# Patient Record
Sex: Male | Born: 1975 | Race: Black or African American | Hispanic: No | Smoking: Former smoker
Health system: Southern US, Community
[De-identification: ages and names within clinical notes are randomized; demographics above are authoritative.]

## PROBLEM LIST (undated history)

## (undated) DIAGNOSIS — I10 Essential (primary) hypertension: Secondary | ICD-10-CM

## (undated) DIAGNOSIS — K219 Gastro-esophageal reflux disease without esophagitis: Secondary | ICD-10-CM

## (undated) DIAGNOSIS — G4733 Obstructive sleep apnea (adult) (pediatric): Secondary | ICD-10-CM

## (undated) DIAGNOSIS — I509 Heart failure, unspecified: Secondary | ICD-10-CM

## (undated) DIAGNOSIS — K429 Umbilical hernia without obstruction or gangrene: Secondary | ICD-10-CM

## (undated) DIAGNOSIS — K5730 Diverticulosis of large intestine without perforation or abscess without bleeding: Secondary | ICD-10-CM

## (undated) DIAGNOSIS — M109 Gout, unspecified: Secondary | ICD-10-CM

## (undated) DIAGNOSIS — I499 Cardiac arrhythmia, unspecified: Secondary | ICD-10-CM

## (undated) DIAGNOSIS — R569 Unspecified convulsions: Secondary | ICD-10-CM

## (undated) HISTORY — DX: Diverticulosis of large intestine without perforation or abscess without bleeding: K57.30

## (undated) HISTORY — DX: Umbilical hernia without obstruction or gangrene: K42.9

## (undated) HISTORY — DX: Obstructive sleep apnea (adult) (pediatric): G47.33

---

## 2008-10-23 ENCOUNTER — Emergency Department: Payer: Self-pay | Admitting: Emergency Medicine

## 2010-01-16 ENCOUNTER — Inpatient Hospital Stay: Payer: Self-pay | Admitting: Internal Medicine

## 2011-01-18 ENCOUNTER — Emergency Department: Payer: Self-pay | Admitting: Unknown Physician Specialty

## 2011-12-22 ENCOUNTER — Ambulatory Visit: Payer: Self-pay | Admitting: Gastroenterology

## 2012-01-06 ENCOUNTER — Ambulatory Visit: Payer: Self-pay | Admitting: Gastroenterology

## 2012-01-07 LAB — PATHOLOGY REPORT

## 2012-10-25 HISTORY — PX: ESOPHAGOGASTRODUODENOSCOPY ENDOSCOPY: SHX5814

## 2015-11-26 ENCOUNTER — Emergency Department
Admission: EM | Admit: 2015-11-26 | Discharge: 2015-11-26 | Disposition: A | Discharge status: Home / Self Care | Payer: Self-pay | Attending: Emergency Medicine | Admitting: Emergency Medicine

## 2015-11-26 ENCOUNTER — Emergency Department: Payer: Self-pay

## 2015-11-26 ENCOUNTER — Encounter: Payer: Self-pay | Admitting: *Deleted

## 2015-11-26 DIAGNOSIS — M10072 Idiopathic gout, left ankle and foot: Secondary | ICD-10-CM | POA: Insufficient documentation

## 2015-11-26 DIAGNOSIS — M109 Gout, unspecified: Secondary | ICD-10-CM

## 2015-11-26 HISTORY — DX: Gout, unspecified: M10.9

## 2015-11-26 LAB — URIC ACID: Uric Acid, Serum: 9.7 mg/dL — ABNORMAL HIGH (ref 4.4–7.6)

## 2015-11-26 MED ORDER — MELOXICAM 15 MG PO TABS: 15.0000 mg | ORAL_TABLET | Freq: Every day | ORAL | Status: DC

## 2015-11-26 NOTE — Discharge Instructions (Signed)

## 2015-11-26 NOTE — ED Provider Notes (Signed)
[facility] Emergency Department Provider Note  ____________________________________________  Time seen: Approximately 6:47 PM  I have reviewed the triage vital signs and the nursing notes.   HISTORY  Chief Complaint Foot Pain    HPI Ronald Pratt is a 40 y.o. male who presents to emergency department complaining of left lateral foot pain. Patient states that he has had symptoms for 2 weeks. He denies any injury precipitating this. He states he does have a history of gout but has not had an issue and 7 years since he had heavy alcohol use. Patient has been a cough free 7 years. He states the area is swollen, painful, worse with weightbearing. The area is sharp in nature. Patient denies any numbness or tingling distally. He denies any other symptoms or complaints at this time.   Past Medical History  Diagnosis Date  . Gout     There are no active problems to display for this patient.   History reviewed. No pertinent past surgical history.  Current Outpatient Rx  Name  Route  Sig  Dispense  Refill  . meloxicam (MOBIC) 15 MG tablet   Oral   Take 1 tablet (15 mg total) by mouth daily.   30 tablet   0     Allergies Review of patient's allergies indicates no known allergies.  No family history on file.  Social History Social History  Substance Use Topics  . Smoking status: Never Smoker   . Smokeless tobacco: None  . Alcohol Use: No     Review of Systems  Constitutional: No fever/chills Cardiovascular: no chest pain. Respiratory: no cough. No SOB. Gastrointestinal: No abdominal pain.  No nausea, no vomiting.   Genitourinary: Negative for dysuria. No hematuria Musculoskeletal: Negative for back pain. Positive for left foot pain. Skin: Negative for rash. Neurological: Negative for headaches, focal weakness or numbness. 10-point ROS otherwise negative.  ____________________________________________   PHYSICAL EXAM:  VITAL SIGNS: ED  Triage Vitals  Enc Vitals Group     BP 11/26/15 1815 174/100 mmHg     Pulse Rate 11/26/15 1815 103     Resp 11/26/15 1815 20     Temp 11/26/15 1815 97.8 F (36.6 C)     Temp Source 11/26/15 1815 Oral     SpO2 11/26/15 1815 99 %     Weight 11/26/15 1815 278 lb (126.1 kg)     Height 11/26/15 1815  (1.854 m)     Head Cir --      Peak Flow --      Pain Score 11/26/15 1816 7     Pain Loc --      Pain Edu? --      Excl. in GC? --      Constitutional: Alert and oriented. Well appearing and in no acute distress. Neck: No stridor.   Hematological/Lymphatic/Immunilogical: No cervical lymphadenopathy. Cardiovascular: Normal rate, regular rhythm. Normal S1 and S2.  Good peripheral circulation. Respiratory: Normal respiratory effort without tachypnea or retractions. Lungs CTAB. Gastrointestinal: Soft and nontender. No distention. No CVA tenderness. Musculoskeletal: No lower extremity tenderness nor edema.  No joint effusions. Minor edema noted to the left lateral foot over the base of fifth metatarsal and compared with right. Area is warm to palpation. Area is very tender to palpation. No palpable abnormality. Full range of motion of ankle and all digits. Sensation intact 5 digits and equal to unaffected extremity. Dorsalis pedis pulses appreciated bilaterally. Neurologic:  Normal speech and language. No gross focal neurologic  deficits are appreciated.  Skin:  Skin is warm, dry and intact. No rash noted. Psychiatric: Mood and affect are normal. Speech and behavior are normal. Patient exhibits appropriate insight and judgement.   ____________________________________________   LABS (all labs ordered are listed, but only abnormal results are displayed)  Labs Reviewed  URIC ACID - Abnormal; Notable for the following:    Uric Acid, Serum 9.7 (*)    All other components within normal limits    ____________________________________________  EKG   ____________________________________________  RADIOLOGY Festus Barren Cuthriell, personally viewed and evaluated these images (plain radiographs) as part of my medical decision making, as well as reviewing the written report by the radiologist.  Dg Foot Complete Left  11/26/2015  CLINICAL DATA:  Left foot pain and swelling without a history of injury. Redness at the base of the fifth toe. EXAM: LEFT FOOT - COMPLETE 3+ VIEW COMPARISON:  None. FINDINGS: No evidence of fracture. No subluxation or dislocation. Degenerative changes are seen at the MTP joint of the great toe. IMPRESSION: No acute bony findings. Specifically, no evidence for bony erosion or destruction in the little toe. Electronically Signed   By: Kennith Center M.D.   On: 11/26/2015 19:16    ____________________________________________    PROCEDURES  Procedure(s) performed:       Medications - No data to display   ____________________________________________   INITIAL IMPRESSION / ASSESSMENT AND PLAN / ED COURSE  Pertinent labs & imaging results that were available during my care of the patient were reviewed by me and considered in my medical decision making (see chart for details).  Patient's diagnosis is consistent with gout. Patient will be discharged home with prescriptions for anti-inflammatories. Patient is to follow up with her care provider if symptoms persist past this treatment course. Patient is given ED precautions to return to the ED for any worsening or new symptoms.     ____________________________________________  FINAL CLINICAL IMPRESSION(S) / ED DIAGNOSES  Final diagnoses:  Acute gout of left foot, unspecified cause      NEW MEDICATIONS STARTED DURING THIS VISIT:  New Prescriptions   MELOXICAM (MOBIC) 15 MG TABLET    Take 1 tablet (15 mg total) by mouth daily.        Delorise Royals Cuthriell, PA-C 11/26/15 2007  Phineas Semen, MD 11/26/15 2153

## 2015-11-26 NOTE — ED Notes (Signed)
Patient transported to X-ray 

## 2015-11-26 NOTE — ED Notes (Signed)
Pt complains of left foot pain with swelling with no injury

## 2015-11-27 ENCOUNTER — Ambulatory Visit: Payer: Self-pay

## 2015-11-27 ENCOUNTER — Encounter: Payer: Self-pay | Admitting: Podiatry

## 2015-11-28 NOTE — Progress Notes (Signed)
No show

## 2016-12-11 ENCOUNTER — Encounter: Payer: Self-pay | Admitting: Emergency Medicine

## 2016-12-11 ENCOUNTER — Emergency Department
Admission: EM | Admit: 2016-12-11 | Discharge: 2016-12-11 | Disposition: A | Discharge status: Home / Self Care | Payer: 59 | Attending: Emergency Medicine | Admitting: Emergency Medicine

## 2016-12-11 ENCOUNTER — Emergency Department: Payer: 59

## 2016-12-11 DIAGNOSIS — R51 Headache: Secondary | ICD-10-CM | POA: Diagnosis not present

## 2016-12-11 DIAGNOSIS — I10 Essential (primary) hypertension: Secondary | ICD-10-CM | POA: Insufficient documentation

## 2016-12-11 DIAGNOSIS — R1033 Periumbilical pain: Secondary | ICD-10-CM

## 2016-12-11 DIAGNOSIS — K5730 Diverticulosis of large intestine without perforation or abscess without bleeding: Secondary | ICD-10-CM | POA: Insufficient documentation

## 2016-12-11 DIAGNOSIS — Z79899 Other long term (current) drug therapy: Secondary | ICD-10-CM | POA: Diagnosis not present

## 2016-12-11 DIAGNOSIS — K429 Umbilical hernia without obstruction or gangrene: Secondary | ICD-10-CM | POA: Diagnosis not present

## 2016-12-11 DIAGNOSIS — E278 Other specified disorders of adrenal gland: Secondary | ICD-10-CM

## 2016-12-11 HISTORY — DX: Diverticulosis of large intestine without perforation or abscess without bleeding: K57.30

## 2016-12-11 HISTORY — DX: Essential (primary) hypertension: I10

## 2016-12-11 LAB — URINALYSIS, COMPLETE (UACMP) WITH MICROSCOPIC
BACTERIA UA: NONE SEEN
BILIRUBIN URINE: NEGATIVE
Glucose, UA: NEGATIVE mg/dL
HGB URINE DIPSTICK: NEGATIVE
KETONES UR: NEGATIVE mg/dL
LEUKOCYTES UA: NEGATIVE
NITRITE: NEGATIVE
Protein, ur: NEGATIVE mg/dL
SPECIFIC GRAVITY, URINE: 1.015 (ref 1.005–1.030)
pH: 6 (ref 5.0–8.0)

## 2016-12-11 LAB — LIPASE, BLOOD: LIPASE: 31 U/L (ref 11–51)

## 2016-12-11 LAB — COMPREHENSIVE METABOLIC PANEL
ALT: 21 U/L (ref 17–63)
ANION GAP: 7 (ref 5–15)
AST: 21 U/L (ref 15–41)
Albumin: 4 g/dL (ref 3.5–5.0)
Alkaline Phosphatase: 54 U/L (ref 38–126)
BILIRUBIN TOTAL: 1.2 mg/dL (ref 0.3–1.2)
BUN: 9 mg/dL (ref 6–20)
CHLORIDE: 107 mmol/L (ref 101–111)
CO2: 25 mmol/L (ref 22–32)
Calcium: 9.2 mg/dL (ref 8.9–10.3)
Creatinine, Ser: 1.18 mg/dL (ref 0.61–1.24)
Glucose, Bld: 104 mg/dL — ABNORMAL HIGH (ref 65–99)
POTASSIUM: 3.6 mmol/L (ref 3.5–5.1)
Sodium: 139 mmol/L (ref 135–145)
TOTAL PROTEIN: 6.8 g/dL (ref 6.5–8.1)

## 2016-12-11 LAB — CBC
HEMATOCRIT: 42.3 % (ref 40.0–52.0)
HEMOGLOBIN: 14.8 g/dL (ref 13.0–18.0)
MCH: 31.5 pg (ref 26.0–34.0)
MCHC: 35.1 g/dL (ref 32.0–36.0)
MCV: 89.8 fL (ref 80.0–100.0)
Platelets: 200 10*3/uL (ref 150–440)
RBC: 4.71 MIL/uL (ref 4.40–5.90)
RDW: 13.2 % (ref 11.5–14.5)
WBC: 7.1 10*3/uL (ref 3.8–10.6)

## 2016-12-11 MED ORDER — NAPROXEN 500 MG PO TABS: 500.0000 mg | ORAL_TABLET | Freq: Two times a day (BID) | ORAL | 0 refills | Status: DC

## 2016-12-11 MED ORDER — IOPAMIDOL (ISOVUE-300) INJECTION 61%
100.0000 mL | Freq: Once | INTRAVENOUS | Status: AC | PRN
  Administered 2016-12-11: 100 mL via INTRAVENOUS

## 2016-12-11 MED ORDER — IOPAMIDOL (ISOVUE-300) INJECTION 61%
30.0000 mL | Freq: Once | INTRAVENOUS | Status: AC | PRN
  Administered 2016-12-11: 30 mL via ORAL

## 2016-12-11 MED ORDER — DOCUSATE SODIUM 100 MG PO CAPS: 200.0000 mg | ORAL_CAPSULE | Freq: Two times a day (BID) | ORAL | 0 refills | Status: DC

## 2016-12-11 NOTE — ED Triage Notes (Signed)
Pt ambulatory to triage in NAD, reports umbilical hernia giving him pain over past couple weeks, reports some vomiting.

## 2016-12-11 NOTE — ED Provider Notes (Signed)
[facility] Emergency Department Provider Note  ____________________________________________  Time seen: Approximately 8:05 AM  I have reviewed the triage vital signs and the nursing notes.   HISTORY  Chief Complaint Abdominal Pain and Hernia    HPI Audrie GallusJohn E Barillas is a 41 y.o. male who reports multiple chronic complaints. First, he complains of suprapubic and periumbilical pain for the last couple of weeks. It's worse when he is doing heavy lifting. He does a lot of exertional activity and heavy lifting at work. He has a small bulge at the belly button. This has not been previously evaluated. He is having some decreased stool caliber and intermittent vomiting. Pain is nonradiating, colicky, moderate intensity at its worst. No fever or chills.  Patient also reports intermittent headaches with right sided paresthesia which is been going on for last 2-3 months. Comes and goes at random. No aggravating or alleviating factors. No weakness. No vision changes. No syncope.  The patient has scheduled a visit with primary care which is on 12/24/2016.   Past Medical History:  Diagnosis Date  . Gout   . Hypertension      There are no active problems to display for this patient.    History reviewed. No pertinent surgical history.   Prior to Admission medications   Medication Sig Start Date End Date Taking? Authorizing Provider  Chilton SiGreen Tea, Camillia sinensis, ([employer] SUPER GREEN TEA EXTRACT) 250 MG CAPS Take 2 capsules by mouth 2 (two) times daily.   Yes Historical Provider, MD  meloxicam (MOBIC) 15 MG tablet Take 1 tablet (15 mg total) by mouth daily. 11/26/15  Yes Christiane HaJonathan D Cuthriell, PA-C  docusate sodium (COLACE) 100 MG capsule Take 2 capsules (200 mg total) by mouth 2 (two) times daily. 12/11/16   Sharman CheekPhillip Sharlee Rufino, MD  naproxen (NAPROSYN) 500 MG tablet Take 1 tablet (500 mg total) by mouth 2 (two) times daily with a meal. 12/11/16   Sharman CheekPhillip Ceaira Ernster, MD      Allergies Patient has no known allergies.   History reviewed. No pertinent family history.  Social History Social History  Substance Use Topics  . Smoking status: Never Smoker  . Smokeless tobacco: Never Used  . Alcohol use No    Review of Systems  Constitutional:   No fever or chills.  ENT:   No sore throat. No rhinorrhea. Cardiovascular:   No chest pain. Respiratory:   No dyspnea or cough. Gastrointestinal:   Abdominal pain as above with occasional vomiting. Denies constipation.  Genitourinary:   Negative for dysuria or difficulty urinating. Musculoskeletal:   Negative for focal pain or swelling Neurological:   Positive as above for intermittent generalized headaches 10-point ROS otherwise negative.  ____________________________________________   PHYSICAL EXAM:  VITAL SIGNS: ED Triage Vitals  Enc Vitals Group     BP 12/11/16 0630 (!) 163/100     Pulse Rate 12/11/16 0630 82     Resp 12/11/16 0630 16     Temp 12/11/16 0630 97.9 F (36.6 C)     Temp Source 12/11/16 0630 Oral     SpO2 12/11/16 0630 97 %     Weight 12/11/16 0630 244 lb (110.7 kg)     Height 12/11/16 0630 6\' 1"  (1.854 m)     Head Circumference --      Peak Flow --      Pain Score 12/11/16 0631 7     Pain Loc --      Pain Edu? --      Excl.  in GC? --     Vital signs reviewed, nursing assessments reviewed.   Constitutional:   Alert and oriented. Well appearing and in no distress. Eyes:   No scleral icterus. No conjunctival pallor. PERRL. EOMI.  No nystagmus. ENT   Head:   Normocephalic and atraumatic.   Nose:   No congestion/rhinnorhea. No septal hematoma   Mouth/Throat:   MMM, no pharyngeal erythema. No peritonsillar mass.    Neck:   No stridor. No SubQ emphysema. No meningismus. Hematological/Lymphatic/Immunilogical:   No cervical lymphadenopathy. Cardiovascular:   RRR. Symmetric bilateral radial and DP pulses.  No murmurs.  Respiratory:   Normal respiratory effort  without tachypnea nor retractions. Breath sounds are clear and equal bilaterally. No wheezes/rales/rhonchi. Gastrointestinal:   Soft with 2-3 cm bulge at the umbilicus which is soft but non-reducible. Suprapubic and umbilical tenderness.. Non distended. There is no CVA tenderness.  No rebound, rigidity, or guarding. Genitourinary:   deferred Musculoskeletal:   Nontender with normal range of motion in all extremities. No joint effusions.  No lower extremity tenderness.  No edema. Neurologic:   Normal speech and language.  CN 2-10 normal. Motor grossly intact. NIH stroke scale 0 No gross focal neurologic deficits are appreciated.  Skin:    Skin is warm, dry and intact. No rash noted.  No petechiae, purpura, or bullae.  ____________________________________________    LABS (pertinent positives/negatives) (all labs ordered are listed, but only abnormal results are displayed) Labs Reviewed  COMPREHENSIVE METABOLIC PANEL - Abnormal; Notable for the following:       Result Value   Glucose, Bld 104 (*)    All other components within normal limits  URINALYSIS, COMPLETE (UACMP) WITH MICROSCOPIC - Abnormal; Notable for the following:    Color, Urine YELLOW (*)    APPearance CLEAR (*)    Squamous Epithelial / LPF 0-5 (*)    All other components within normal limits  LIPASE, BLOOD  CBC   ____________________________________________   EKG    ____________________________________________    RADIOLOGY  CT head unremarkable CT abdomen and pelvis reveals a small umbilical hernia containing only fat. Diverticulosis. Hepatic steatosis. Left-sided adrenal mass requiring non-emergent MRI follow-up.  ____________________________________________   PROCEDURES Procedures  ____________________________________________   INITIAL IMPRESSION / ASSESSMENT AND PLAN / ED COURSE  Pertinent labs & imaging results that were available during my care of the patient were reviewed by me and considered  in my medical decision making (see chart for details).  Patient presents with multiple complaints. Most concerning of which is the possible incarcerated umbilical hernia. We'll get a CT abdomen and pelvis to evaluate. We do CT head to his chronic complaints of paresthesia and headache although I have low suspicion for acute stroke. We'll check labs and continue to monitor.   ----------------------------------------- 12:05 PM on 12/11/2016 -----------------------------------------  Patient calm and comfortable, vital signs unremarkable and stable. Not requiring any medications in the ED. Workup is essentially unremarkable. Patient informed of adrenal mass which requires follow-up. Has an appointment with primary care March 2. Patient provided with CT results to pass along. Follow-up with surgery regarding symptomatic hernia.      ____________________________________________   FINAL CLINICAL IMPRESSION(S) / ED DIAGNOSES  Final diagnoses:  Periumbilical abdominal pain  Umbilical hernia without obstruction and without gangrene  Adrenal mass, left (HCC)  Diverticulosis of large intestine without hemorrhage      New Prescriptions   DOCUSATE SODIUM (COLACE) 100 MG CAPSULE    Take 2 capsules (200 mg total) by mouth  2 (two) times daily.   NAPROXEN (NAPROSYN) 500 MG TABLET    Take 1 tablet (500 mg total) by mouth 2 (two) times daily with a meal.     Portions of this note were generated with dragon dictation software. Dictation errors may occur despite best attempts at proofreading.    Sharman Cheek, MD 12/11/16 [phone]

## 2016-12-11 NOTE — Discharge Instructions (Signed)
Your labs are unremarkable. Your CT shows a small umbilical hernia which contains only fat. Follow-up with surgery for further evaluation of this. The CT also finds a mass on your left adrenal gland. Follow-up with primary care for further evaluation.   Umbilical hernia containing only fat. No panniculitis seen in the  area of this hernia.     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  Further evaluation with nonemergent pre and post contrast MRI should  be considered. Pre and post contrast CT could alternatively be  performed, but would likely be of decreased accuracy given lesion  size.     Mild hepatic steatosis.     Scattered sigmoid diverticula. No bowel obstruction. No abscess.  Appendix appears normal.

## 2016-12-11 NOTE — ED Notes (Signed)
Waiting on disposition. NAD. No needs.

## 2016-12-11 NOTE — ED Notes (Signed)
Waiting on CT results. No needs at this time. NAD

## 2016-12-20 DIAGNOSIS — K429 Umbilical hernia without obstruction or gangrene: Secondary | ICD-10-CM | POA: Insufficient documentation

## 2016-12-20 DIAGNOSIS — K5730 Diverticulosis of large intestine without perforation or abscess without bleeding: Secondary | ICD-10-CM | POA: Insufficient documentation

## 2016-12-23 ENCOUNTER — Encounter: Payer: Self-pay | Admitting: Surgery

## 2016-12-23 ENCOUNTER — Telehealth: Payer: Self-pay

## 2016-12-23 ENCOUNTER — Ambulatory Visit (INDEPENDENT_AMBULATORY_CARE_PROVIDER_SITE_OTHER): Payer: Self-pay | Admitting: Surgery

## 2016-12-23 VITALS — BP 158/101 | HR 83 | Temp 98.0°F | Ht 73.0 in | Wt 244.2 lb

## 2016-12-23 DIAGNOSIS — K429 Umbilical hernia without obstruction or gangrene: Secondary | ICD-10-CM

## 2016-12-23 NOTE — Telephone Encounter (Signed)
Morrie SheldonAshley from Dr.Hande office called and stated she moved patients appointment from 01/03/17 to 12/29/16 and that patient had been notified of change. Also let her know that I had faxed over Medical Clearance form to be completed after office visit.   I called patient to be sure he knew of appointment change. Patient verbalized he was aware.

## 2016-12-23 NOTE — Progress Notes (Addendum)
01/08/2017  Reason for Visit:  Umbilical hernia  History of Present Illness: Ronald Pratt is a 41 y.o. male who was seen in the emergency room on 12/11/16 with abdominal pain in the periumbilical area.  He had a CAT scan of the abdomen and pelvis which showed an umbilical hernia containing fat only. His pain improved while in the emergency room and was discharged to home. Of note he was also noted to have a left renal lesion or mass and radiology recommended follow-up with an MRI.  The patient reports that he's had this umbilical hernia for about 2 years, and has had terminated episodes of pain in the periumbilical area particularly as the hernia bulges out more. He is reported some episodes of nausea in the past but no episodes of emesis. He denies having any fevers or chills. Denies having any other areas of abdominal pain. Denies having any constipation. He reports that he's been on a diet with his girlfriend and has been losing a significant amount of weight about 30 pounds over the last few months and that has improved with pain symptoms from his hernia as there is less pressure on it. He does heavy lifting for work and does have more discomfort at the end of a long day.  Past Medical History: Past Medical History:  Diagnosis Date  . Diverticulosis large intestine w/o perforation or abscess w/o bleeding 12/11/2016  . Dysrhythmia    tacchycardia new..  put on metoprolol by dr. Juliann Parescallwood for surgery  . GERD (gastroesophageal reflux disease)   . Gout   . Hypertension   . 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  [name]. Umbilical hernia      Past Surgical History: --None  Home Medications: Prior to Admission medications   Medication Sig Start Date End Date Taking? Authorizing Provider  docusate sodium (COLACE) 100 MG capsule Take 2 capsules (200 mg total) by mouth 2 (two) times daily. 12/11/16   Sharman CheekPhillip Stafford, MD  Chilton SiGreen Tea, Camillia sinensis, ([employer]  SUPER GREEN TEA EXTRACT) 250 MG CAPS Take 2 capsules by mouth 2 (two) times daily.    Historical Provider, MD  meloxicam (MOBIC) 15 MG tablet Take 1 tablet (15 mg total) by mouth daily. 11/26/15   Delorise RoyalsJonathan D Cuthriell, PA-C  naproxen (NAPROSYN) 500 MG tablet Take 1 tablet (500 mg total) by mouth 2 (two) times daily with a meal. 12/11/16   Sharman CheekPhillip Stafford, MD    Allergies: No Known Allergies  Social History: Patient smokes 1 ppd and trying to cut down. He has never used smokeless tobacco. He reports that he does not drink alcohol and quit 8 years ago. His drug history is not on file.   Family History: Family History  Problem Relation Age of Onset  . Breast cancer Mother   . Heart disease Mother   . Prostate cancer Father   . Colon cancer Father   . Diabetes Sister   . Heart disease Sister     Review of Systems: Review of Systems  Constitutional: Negative for chills and fever.  HENT: Negative for hearing loss.   Eyes: Negative for blurred vision.  Respiratory: Negative for cough.   Cardiovascular: Negative for chest pain and leg swelling.  Gastrointestinal: Positive for abdominal pain. Negative for constipation, diarrhea, nausea and vomiting.  Genitourinary: Negative for dysuria and hematuria.  Musculoskeletal: Negative for myalgias.  Skin: Negative for rash.  Neurological: Negative for dizziness.  Psychiatric/Behavioral: Negative for depression.  All other systems reviewed and are negative.   Physical Exam BP (!) 158/101   Pulse 83   Temp 98 F (36.7 C) (Oral)   Ht 6\' 1"  (1.854 m)   Wt 110.8 kg (244 lb 3.2 oz)   BMI 32.22 kg/m  CONSTITUTIONAL: No acute distress HEENT:  Normocephalic, atraumatic, extraocular motion intact. NECK: Trachea is midline, and there is no jugular venous distension.  RESPIRATORY:  Lungs are clear, and breath sounds are equal bilaterally. Normal respiratory effort without pathologic use of accessory muscles. CARDIOVASCULAR: Heart is regular  without murmurs, gallops, or rubs. GI: The abdomen is soft, obese, nondistended, with mild tenderness to palpation over the umbilical region.  The patient has a fat containing umbilical hernia which is partially reducible. There were no palpable masses.  MUSCULOSKELETAL:  Normal muscle strength and tone in all four extremities.  No peripheral edema or cyanosis. SKIN: Skin turgor is normal. There are no pathologic skin lesions.  NEUROLOGIC:  Motor and sensation is grossly normal.  Cranial nerves are grossly intact. PSYCH:  Alert and oriented to person, place and time. Affect is normal.  Laboratory Analysis: In the emergency room on 2/17, the patient had a WBC of 7.1, HCT 42.3, sodium 139, potassium 3.6, chloride 107, CO2 25, BUN 9, creatinine 1.18.  Imaging: CT scan on 2/17 shows an umbilical hernia containing only fat measuring about 2.2 cm in diameter. Patient also was found to have a 1.3 x 1.2 cm left renal mass which could not be classified as a cyst and MRI was recommended for follow-up.  Assessment and Plan: This is a 41 y.o. male who presents with a two-year history of an umbilical hernia with a recent visit to the emergency room for periumbilical pain.  -Discussed with the patient that we could potentially do conservative management with watchful waiting with his umbilical hernia although given that he's had intermittent episodes in the recent one that brought him to the emergency room, it would also be reasonable to go ahead and repair this hernia. The patient is opting for surgical management. Discussed with the patient and umbilical hernia repair with mesh and that we would use mesh given that he does heavy lifting for work to help reinforce the repair. Discussed with the patient the risks and benefits of the procedure including risk of bleeding, infection, injury to surrounding structures, as well as postop course and expectations as well as lifting restrictions afterwards. The patient is  willing to proceed and has given informed consent. -Patient has a follow point with his PCP early in March during which she will discuss the findings on his CT scan as well as follow-up on his blood pressure which was elevated today. We will schedule his surgery for mid March after being seen by his PCP.  Face-to-face time spent with the patient and care providers was 45 minutes, with more than 50% of the time spent counseling, educating, and coordinating care of the patient.     Howie Ill, MD [facility]

## 2016-12-23 NOTE — Patient Instructions (Addendum)
We have scheduled your surgery on 01/05/17 with Dr.Piscoya at [facility]. Please see your blue pre-care sheet for surgery information. Please call our office if you have questions or concerns.

## 2016-12-23 NOTE — Telephone Encounter (Addendum)
Patient has been advised of Surgery Date as well as Pre-Admission appointment date, time, and location.  Surgery Date: 01/04/17  Pre-admit Appointment: 12/29/16 from 0900-1300 (Phone)  Patient has been advised to call [phone] the day before surgery between 1-3pm to obtain arrival time.

## 2016-12-23 NOTE — Telephone Encounter (Signed)
Medical Clear[facility] at this [facility]  Patient has an appointment with the above physician on 01/03/17.

## 2016-12-24 NOTE — Telephone Encounter (Signed)
No authorization required for CPT codes [id number] per [facility] @ [employer].

## 2016-12-29 ENCOUNTER — Encounter
Admission: RE | Admit: 2016-12-29 | Discharge: 2016-12-29 | Disposition: A | Discharge status: Home / Self Care | Payer: 59 | Source: Ambulatory Visit | Attending: Surgery | Admitting: Surgery

## 2016-12-29 DIAGNOSIS — I10 Essential (primary) hypertension: Secondary | ICD-10-CM | POA: Insufficient documentation

## 2016-12-29 HISTORY — DX: Gastro-esophageal reflux disease without esophagitis: K21.9

## 2016-12-29 NOTE — Patient Instructions (Signed)
  Your procedure is scheduled on: 01-04-17 Report to Same Day Surgery 2nd floor medical mall [facility](Medical Mall Entrance-take elevator on left to 2nd floor.  Check in with surgery information desk.) To find out your arrival time please call [phone] between 1PM - 3PM on 01-03-17  Remember: Instructions that are not followed completely may result in serious medical risk, up to and including death, or upon the discretion of your surgeon and anesthesiologist your surgery may need to be rescheduled.    _x___ 1. Do not eat food or drink liquids after midnight. No gum chewing or  hard candies.     __x__ 2. No Alcohol for 24 hours before or after surgery.   __x__3. No Smoking for 24 prior to surgery.   ____  4. Bring all medications with you on the day of surgery if instructed.    __x__ 5. Notify your doctor if there is any change in your medical condition     (cold, fever, infections).     Do not wear jewelry, make-up, hairpins, clips or nail polish.  Do not wear lotions, powders, or perfumes. You may wear deodorant.  Do not shave 48 hours prior to surgery. Men may shave face and neck.  Do not bring valuables to the hospital.    [facility] is not responsible for any belongings or valuables.               Contacts, dentures or bridgework may not be worn into surgery.  Leave your suitcase in the car. After surgery it may be brought to your room.  For patients admitted to the hospital, discharge time is determined by your   treatment team.   Patients discharged the day of surgery will not be allowed to drive home.  You will need someone to drive you home and stay with you the night of your procedure.    Please read over the following fact sheets that you were given:    _x___ Take anti-hypertensive (unless it includes a diuretic), cardiac, seizure, asthma,     anti-reflux and psychiatric medicines. These include:  1. ZANTAC  2. TAKE A ZANTAC Monday NIGHT BEFORE  BED  3.  4.  5.  6.  ____Fleets enema or Magnesium Citrate as directed.   ____ Use CHG Soap or sage wipes as directed on instruction sheet   ____ Use inhalers on the day of surgery and bring to hospital day of surgery  ____ Stop Metformin and Janumet 2 days prior to surgery.    ____ Take 1/2 of usual insulin dose the night before surgery and none on the morning     surgery.   ____ Follow recommendations from Cardiologist, Pulmonologist or PCP regarding          stopping Aspirin, Coumadin, Pllavix ,Eliquis, Effient, or Pradaxa, and Pletal.  X____Stop Anti-inflammatories such as Advil, Aleve, Ibuprofen, Motrin, Naproxen, Naprosyn, Goodies powders or aspirin products NOW-OK to take Tylenol    _x___ Stop supplements until after surgery-STOP GREEN TEA EXTRACT NOW.   ____ Bring C-Pap to the hospital.

## 2017-01-03 MED ORDER — CEFAZOLIN SODIUM-DEXTROSE 2-4 GM/100ML-% IV SOLN
2.0000 g | INTRAVENOUS | Status: AC
  Administered 2017-01-04: 2 g via INTRAVENOUS

## 2017-01-03 MED ORDER — GABAPENTIN 300 MG PO CAPS
300.0000 mg | ORAL_CAPSULE | ORAL | Status: AC
  Administered 2017-01-04: 300 mg via ORAL

## 2017-01-03 MED ORDER — ACETAMINOPHEN 500 MG PO TABS
1000.0000 mg | ORAL_TABLET | ORAL | Status: AC
  Administered 2017-01-04: 1000 mg via ORAL

## 2017-01-03 NOTE — Pre-Procedure Instructions (Signed)
Azzie Glatter, MD - 12/29/2016 1:45 PM EST Formatting of this note may be different from the original. Chief Complaint  Patient presents with  . Establish Care   HPI  Ronald Pratt is a 41 y.o. here to Establish Care. Pt is scheduled for Umbilical hernia surgery soon and here for a Pre-op evaluation. Had an episode of abdominal pain and was seen in ER on 12/23/16. Occasionally has chest pains - lasts only for a few seconds  Denies shortness of breath Had a flare up of Gout late last year. Smokes approx 1 ppd (has cut back from 2 ppd ) No alcohol. Last smoked marijuana last christmas. Has lost approx 40 lbs in weight in the last 2 months States he snores loudly and has witnessed apneas -( According to wife) Has day time somnolence.and acid reflux   ROS Rest of 10 point review of systems is normal.  No outpatient encounter prescriptions on file as of 12/29/2016.   No facility-administered encounter medications on file as of 12/29/2016.   Allergies as of 12/29/2016  . (No Known Allergies)   Past Medical History:  Diagnosis Date  . Seizures (CMS-HCC) 2009   History reviewed. No pertinent surgical history.  Vitals:  12/29/16 1403  BP: (!) 158/100  Pulse: 81  Body mass index is 32.4 kg/m.  No results found for any previous visit.   Exam  Blood pressure (!) 158/100, pulse 81, height 185.4 cm (6' 1"), weight (!) 111.4 kg (245 lb 9.6 oz), SpO2 97 %.  General. Alert oriented x3  Skin. No suspicious lesions or moles.  Eyes. Sclera and conjunctiva clear; pupils equal round and reactive to light extraocular movements intact Ears. External normal; canals clear; tympanic membranes normal Nose. Mucosa healthy without drainage or ulceration Oropharynx. No suspicious lesions Neck. No swelling, masses, stiffness, pain, limited movement, carotid pulses normal bilaterally, thyroid normal size, no masses palpated. No bruits Lungs. Respirations unlabored; clear to auscultation  bilaterally Back. No spinal deformity Cardiovascular. Heart regular rate and rhythm without murmurs, gallops, or rubs Abdomen. Soft; non tender; non distended; normoactive bowel sounds; no masses or organomegaly Umbilical hernia noted  RECTAL:Declined Lymph Nodes. No significant cervical, supraclavicular, axillary or inguinal lymphadenopathy noted Musculoskeletal. No deformities; no active joint inflammation Extremities. Normal, no edema Neurologic. Alert and oriented; speech intact; face symmetrical; moves all extremities well  Assessment and Plan  1 Pre-op evaluation for Umbilical Hernia :EKG today: SR, LVH  Pt also has had episodes of chest pain Advised Cardiology evaluation to R/o CAD  2 HTN: Start Losartan/HCT 50/12.5 1 tab po qd  Come in for Nurse BP check in 1 week  DASH diet  3 ED; Prescription e scribed for Cialis  4 Personal hx of Gout : On Colchicine prn. 5 GERD: Start Omeprazole 20 m po qd  6 Day time somnolence and loud snoring: Discussed possible OSA_ Declines sleep study 7 Health Maintenance Declines Flu shot  Check cbc, Met-c, Lipids ua and TSH and PSA today  Follow up in 3 months  Tracie Harrier MD     Plan of Treatment - as of this encounter  Upcoming Encounters Upcoming Encounters  Date Type Specialty Care Team Description  01/03/2017 Nurse Only Internal Medicine     Pending Results Pending Results  Name Priority Associated Diagnoses Date/Time  ECG 12-lead Routine Essential hypertension, benign  Umbilical hernia without obstruction and without gangrene  Encounter to establish care  Erectile dysfunction, unspecified erectile dysfunction type  Personal history of gout  12/29/2016 2:49 PM EST   Scheduled Referrals Scheduled Referrals  Name Priority Associated Diagnoses Order Schedule  Ambulatory Referral to Cardiology ASAP Essential hypertension, benign  Atypical chest pain  Ordered: 12/29/2016   Lab Results - in this  encounter  Table of Contents for Lab Results Uric Acid (12/29/2016 3:12 PM) PSA, Total (Screen) (12/29/2016 3:12 PM) Urinalysis w/Microscopic (12/29/2016 3:12 PM) Thyroid Stimulating-Hormone (TSH) (12/29/2016 3:12 PM) Hemoglobin A1C (12/29/2016 3:12 PM) Lipid Panel w/calc LDL (12/29/2016 3:12 PM) Comprehensive Metabolic Panel (CMP) (93/73/4287 3:12 PM) CBC w/auto Differential (5 Part) (12/29/2016 3:12 PM)    Uric Acid (12/29/2016 3:12 PM) Uric Acid (12/29/2016 3:12 PM)  Component Value Ref Range  Uric Acid 7.4 4.4 - 7.6 mg/dL   Uric Acid (12/29/2016 3:12 PM)  Specimen Performing Laboratory  Blood [facility] - LAB  [place], [place] [address]   Back to top of Lab Results   PSA, Total (Screen) (12/29/2016 3:12 PM) PSA, Total (Screen) (12/29/2016 3:12 PM)  Component Value Ref Range  PSA (Prostate Specific Antigen), Total 1.20 0.10 - 4.00 ng/mL   PSA, Total (Screen) (12/29/2016 3:12 PM)  Specimen Performing Laboratory  Blood [facility] - LAB  [place], [place] [address]    PSA, Total (Screen) (12/29/2016 3:12 PM)  Narrative    Test results were determined with [facility] Hybritech Assay. Values obtained with different assay methods cannot be used interchangeably in serial testing. Assay results should not be interpreted as absolute evidence of the presence or absence of malignant disease   Back to top of Lab Results   Urinalysis w/Microscopic (12/29/2016 3:12 PM) Urinalysis w/Microscopic (12/29/2016 3:12 PM)  Component Value Ref Range  Color Yellow Yellow, Straw, Blue  Clarity Clear Clear, Slightly Cloudy, Turbid Other  Specific Gravity 1.015 1.000 - 1.030  pH, Urine 7.0 5.0 - 8.0  Protein, Urinalysis Negative Negative, Trace mg/dL  Glucose, Urinalysis Negative Negative mg/dL  Ketones, Urinalysis Negative Negative mg/dL  Blood, Urinalysis Negative Negative  Nitrite, Urinalysis Negative  Negative  Leukocyte Esterase, Urinalysis Negative Negative  White Blood Cells, Urinalysis 4-10 (A) None Seen, 0-3 /hpf  Red Blood Cells, Urinalysis 0-3 None Seen, 0-3 /hpf  Bacteria, Urinalysis Rare (A) None Seen /hpf  Squamous Epithelial Cells, Urinalysis Few Rare, Few, None Seen /hpf   Urinalysis w/Microscopic (12/29/2016 3:12 PM)  Specimen Performing Laboratory  Urine [facility] - LAB  [place], [place] [address]    Urinalysis w/Microscopic (12/29/2016 3:12 PM)  Narrative  Moderate mucus   Back to top of Lab Results   Thyroid Stimulating-Hormone (TSH) (12/29/2016 3:12 PM) Thyroid Stimulating-Hormone (TSH) (12/29/2016 3:12 PM)  Component Value Ref Range  Thyroid Stimulating Hormone (TSH) 0.377 (L) 0.450-5.330 uIU/ml uIU/mL   Thyroid Stimulating-Hormone (TSH) (12/29/2016 3:12 PM)  Specimen Performing Laboratory  Blood [facility] - LAB  [place], [place] [address]   Back to top of Lab Results   Hemoglobin A1C (12/29/2016 3:12 PM) Hemoglobin A1C (12/29/2016 3:12 PM)  Component Value Ref Range  Hemoglobin A1C 5.8 (H) 4.2 - 5.6 %  Average Blood Glucose (Calc) 120 mg/dL   Hemoglobin A1C (12/29/2016 3:12 PM)  Specimen Performing Laboratory  Blood [facility] - LAB  [place], [place] [address]    Hemoglobin A1C (12/29/2016 3:12 PM)  Narrative    Normal Range:4.2 - 5.6%  Increased Risk:5.7 - 6.4%  Diabetes:>= 6.5%  Glycemic Control  for adults with diabetes:<7%   Back to top of Lab Results   Lipid Panel w/calc LDL (12/29/2016 3:12 PM) Lipid Panel w/calc LDL (12/29/2016 3:12 PM)  Component Value Ref Range  Cholesterol, Total 155 100 - 200 mg/dL  Triglyceride 72 35 - 199 mg/dL  HDL (High Density Lipoprotein) Cholesterol 47.1 29.0 - 71.0 mg/dL  LDL (Low Density Lipoprotien), Calculated 94 0 - 130 mg/dL  VLDL Cholesterol 14 mg/dL   Cholesterol/HDL Ratio 3.3    Lipid Panel w/calc LDL (12/29/2016 3:12 PM)  Specimen Performing Laboratory  Blood [facility] - LAB  [place], [place] [address]   Back to top of Lab Results   Comprehensive Metabolic Panel (CMP) (75/07/2584 3:12 PM) Comprehensive Metabolic Panel (CMP) (27/78/2423 3:12 PM)  Component Value Ref Range  Glucose 83 70 - 110 mg/dL  Sodium 143 136 - 145 mmol/L  Potassium 4.3 3.6 - 5.1 mmol/L  Chloride 106 97 - 109 mmol/L  Carbon Dioxide (CO2) 31.9 22.0 - 32.0 mmol/L  Urea Nitrogen (BUN) 14 7 - 25 mg/dL  Creatinine 1.0 0.7 - 1.3 mg/dL  Glomerular Filtration Rate (eGFR), MDRD Estimate 100 >60 mL/min/1.73sq m  Calcium 10.0 8.7 - 10.3 mg/dL  AST  15 8 - 39 U/L  ALT  22 6 - 57 U/L  Alk Phos (alkaline Phosphatase) 60 34 - 104 U/L  Albumin 4.3 3.5 - 4.8 g/dL  Bilirubin, Total 0.6 0.3 - 1.2 mg/dL  Protein, Total 6.9 6.1 - 7.9 g/dL  A/G Ratio 1.7 1.0 - 5.0 gm/dL   Comprehensive Metabolic Panel (CMP) (53/61/4431 3:12 PM)  Specimen Performing Laboratory  Blood [facility] - LAB  [place], [place] [address]   Back to top of Lab Results   CBC w/auto Differential (5 Part) (12/29/2016 3:12 PM) CBC w/auto Differential (5 Part) (12/29/2016 3:12 PM)  Component Value Ref Range  WBC (White Blood Cell Count) 12.2 (H) 4.1 - 10.2 10^3/uL  RBC (Red Blood Cell Count) 4.94 4.69 - 6.13 10^6/uL  Hemoglobin 15.5 14.1 - 18.1 gm/dL  Hematocrit 45.6 40.0 - 52.0 %  MCV (Mean Corpuscular Volume) 92.3 80.0 - 100.0 fl  MCH (Mean Corpuscular Hemoglobin) 31.4 (H) 27.0 - 31.2 pg  MCHC (Mean Corpuscular Hemoglobin Concentration) 34.0 32.0 - 36.0 gm/dL  Platelet Count 235 150 - 450 10^3/uL  RDW-CV (Red Cell Distribution Width) 13.4 11.6 - 14.8 %  MPV (Mean Platelet Volume) 10.2 9.4 - 12.4 fl  Neutrophils 8.60 (H) 1.50 - 7.80 10^3/uL  Lymphocytes 2.63 1.00 - 3.60 10^3/uL  Monocytes 0.79 0.00 - 1.50 10^3/uL   Eosinophils 0.12 0.00 - 0.55 10^3/uL  Basophils 0.06 0.00 - 0.09 10^3/uL  Neutrophil % 70.3 (H) 32.0 - 70.0 %  Lymphocyte % 21.5 10.0 - 50.0 %  Monocyte % 6.5 4.0 - 13.0 %  Eosinophil % 1.0 1.0 - 5.0 %  Basophil% 0.5 0.0 - 2.0 %  Immature Granulocyte % 0.2 <=0.7 %  Immature Granulocyte Count 0.03 <=0.06 10^3/L   CBC w/auto Differential (5 Part) (12/29/2016 3:12 PM)  Specimen Performing Laboratory  Blood [facility] - LAB  [place],  [address]   Back to top of Lab Results   Visit Diagnoses   Diagnosis  Essential hypertension, benign - Primary  Encounter to establish care  Pre-op evaluation  Umbilical hernia without obstruction and without gangrene  Erectile dysfunction, unspecified erectile dysfunction type  Personal history of gout  Personal history of endocrine,  metabolic, and immunity disorders   Atypical chest pain  Other chest pain   Tobacco user  Tobacco use disorder    Images Document Information  Primary Care Provider Azzie Glatter MD (Feb. 06, 2018 - Present) [phone] (Work) [phone] (Fax) [address] [place], [place] [address]  Document Coverage Dates Mar. 07, 2018  [place], [place] [address]   Encounter Providers Azzie Glatter MD (Attending) [phone] (Work) [phone] (Fax) [address] [facility] [place],  [address]   Encounter Date Mar. 07, 2018

## 2017-01-03 NOTE — Pre-Procedure Instructions (Signed)
Mirian Capuchinallwood, Dwayne Dennis, MD - 12/30/2016 2:00 PM EST Formatting of this note may be different from the original. New Patient Visit   Chief Complaint: Chief Complaint  Patient presents with  . Establish Care  NEW PT PER DR HANDE  . Pre-op Exam  HERNIA REPAIR  Date of Service: 12/30/2016 Date of Birth: 02/23/1976 PCP: Alan MulderVISHWANATH HANDATTUR HANDE, MD  History of Present Illness: Mr. Ronald Pratt is a 41 y.o.male patient who Referred by Dr. Janee MornHan day for evaluation preoperative clearance prior to surgery. Patient found have a hernia and now is preop for surgery he was scheduled upcoming Tuesday but was found have a abnormal EKG with its some tachycardia so cardiology evaluation was recommended. Patient is a history of hypertension GERD smoking obesity. Patient has been losing weight recently is lost about 40 lb over the last 2 weeks he and his wife changed the diet and began exercising. His goal is to lose another 30 or 40 lb. Patient was found have a hernia he thought that with weight loss it might improve his hernia but he still scheduled to undergo the procedure has not had any cardiac workup.  Past Medical and Surgical History  Past Medical History Past Medical History:  Diagnosis Date  . Seizures (CMS-HCC) 2009   Past Surgical History He has no past surgical history on file.   Medications and Allergies  Current Medications Current Outpatient Prescriptions  Medication Sig Dispense Refill  . losartan-hydrochlorothiazide (HYZAAR) 50-12.5 mg tablet Take 1 tablet by mouth once daily. 30 tablet 5  . metoprolol succinate (TOPROL-XL) 50 MG XL tablet Take 1 tablet (50 mg total) by mouth once daily. 30 tablet 11  . omeprazole (PRILOSEC) 20 MG DR capsule Take 1 capsule (20 mg total) by mouth once daily. 30 capsule 5  . tadalafil (CIALIS) 20 MG tablet Take 1 tablet (20 mg total) by mouth once daily as needed for Erectile Dysfunction. 10 tablet 0   No current facility-administered medications for this visit.    Allergies Patient has no known allergies.  Social and Family History  Social History reports that he has been smoking Cigarettes. He has been smoking about 1.00 pack per day. He has never used smokeless tobacco. He reports that he does not drink alcohol or use drugs.  Family History family history is not on file.   Review of Systems   Review of Systems: The patient denies chest pain, shortness of breath, orthopnea, paroxysmal nocturnal dyspnea, pedal edema, palpitations, heart racing, presyncope, syncope. Review of 12 Systems is negative except as described above.  Physical Examination   Vitals:BP 124/78  Pulse 106  Ht 185.4 cm (6\' 1" )  Wt (!) 111.6 kg (246 lb)  SpO2 95%  BMI 32.46 kg/m  Ht:185.4 cm (6\' 1" ) Wt:(!) 111.6 kg (246 lb) WUJ:WJXBBSA:Body surface area is 2.4 meters squared. Body mass index is 32.46 kg/m.  HEENT: Pupils equally reactive to light and accomodation  Neck: Supple without thyromegaly, carotid pulses 2+ Lungs: clear to auscultation bilaterally; no wheezes, rales, rhonchi Heart: Regular rate and rhythm. No gallops, murmurs or rub Abdomen: soft nontender, nondistended, with normal bowel sounds Extremities: no cyanosis, clubbing, or edema Peripheral Pulses: 2+ in all extremities, 2+ femoral pulses bilaterally  Assessment   41 y.o. male with  1. Pre-operative clearance  2. Preoperative clearance  3. Hypertension, essential  4. Class 1 obesity with body mass index (BMI) of 32.0 to 32.9 in adult, unspecified obesity type, unspecified whether serious comorbidity present  5. History of ETOH  abuse  6. Alcohol withdrawal seizure with delirium (CMS-HCC)  7. Smoking   Plan  1 Preop clearance for hernia surgery found have abnormal EKG needs cardiac clearance and evaluation 2 regular exercise stress test as part of preoperative clearance 3 recommend metoprolol 50 mg once a day to start pre and postop to modify risk 4 obesity recommend weight loss exercise portion  control 5 hypertension control currently on losartan HCTZ will add metoprolol p.m. 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 CARD stress test only, exercise   Return if symptoms worsen or fail to improve.  Alwyn Pea, MD       Plan of Treatment - as of this encounter  Upcoming Encounters Upcoming Encounters  Date Type Specialty Care Team Description  01/03/2017 Nurse Only Internal Medicine     Pending Results Pending Results  Name Priority Associated Diagnoses Date/Time  CARD stress test only, exercise Routine Pre-operative clearance  12/30/2016 3:15 PM EST   Visit Diagnoses   Diagnosis  Pre-operative clearance - Primary  Unspecified pre-operative examination   Preoperative clearance  Unspecified pre-operative examination   Hypertension, essential  Unspecified essential hypertension   Class 1 obesity with body mass index (BMI) of 32.0 to 32.9 in adult, unspecified obesity type, unspecified whether serious comorbidity present  History of ETOH abuse  Alcohol withdrawal seizure with delirium (CMS-HCC)  Smoking  Tobacco use disorder    Images Document Information  Primary Care Provider Alan Mulder MD (Feb. 06, 2018 - Present) [phone] (Work) [phone] (Fax) [address] [place], [place] [address]  Document Coverage Dates Mar. 08, 2018  [employer] [facility], [place] [address]   Encounter Providers Dwayne Dorthey Sawyer MD (Attending) [phone] (Work) [phone] (Fax) [address] [facility] - CARDIOLOGY [place], [place] [address]   Encounter Date Mar. 08,

## 2017-01-03 NOTE — Pre-Procedure Instructions (Signed)
I called pt to make sure he had his stress test done and hs said it was done last Thursday and Dr Juliann Paresallwood told him everything looked fine.  Called Dr Aleen CampiPiscoya to let him know that his office and Dr Roslynn Ambleallwoods offices are closed due to the weather and I still dont have the cleareance from Dr Juliann Paresallwood yet and pts surgery is tomorrow- Dr Aleen CampiPiscoya said that he would try to call to make sure pt is cleared for his surgery.

## 2017-01-03 NOTE — Pre-Procedure Instructions (Signed)
SPOKE WITH GISELLE EARLIER THIS AM (AT 1100) AT DR PISCOYAS OFFICE REGARDING CLEARANCE THAT THEY ORDERED DUE TO PT HAVING ELEVATED BP IN OFFICE- PT WAS SENT TO DR HANDE WHO THEN SENT PT TO CARDIOOGIST, DR CALLWOOD.  I STILL SEE NO CLEARANCE IN CARE EVERYWHERE OR EPIC. GISELLE SAID THEY WERE STILL WAITING ON CLEARANCE. SHE TOOK MY NAME AND # SAID SHE WOULD LET ME KNOW. I JUST CALLED THEIR OFFICE BACK AND THEY HAVE CLOSED DUE TO WEATHER

## 2017-01-04 ENCOUNTER — Telehealth: Payer: Self-pay

## 2017-01-04 ENCOUNTER — Encounter: Payer: Self-pay | Admitting: *Deleted

## 2017-01-04 ENCOUNTER — Encounter
Admission: RE | Disposition: A | Discharge status: Home / Self Care | Payer: Self-pay | Source: Ambulatory Visit | Attending: Surgery

## 2017-01-04 ENCOUNTER — Ambulatory Visit
Admission: RE | Admit: 2017-01-04 | Discharge: 2017-01-04 | Disposition: A | Discharge status: Home / Self Care | Payer: PRIVATE HEALTH INSURANCE | Source: Ambulatory Visit | Attending: Surgery | Admitting: Surgery

## 2017-01-04 ENCOUNTER — Ambulatory Visit: Payer: PRIVATE HEALTH INSURANCE | Admitting: Certified Registered"

## 2017-01-04 DIAGNOSIS — Z800 Family history of malignant neoplasm of digestive organs: Secondary | ICD-10-CM | POA: Insufficient documentation

## 2017-01-04 DIAGNOSIS — K5730 Diverticulosis of large intestine without perforation or abscess without bleeding: Secondary | ICD-10-CM | POA: Insufficient documentation

## 2017-01-04 DIAGNOSIS — Z8042 Family history of malignant neoplasm of prostate: Secondary | ICD-10-CM | POA: Insufficient documentation

## 2017-01-04 DIAGNOSIS — I10 Essential (primary) hypertension: Secondary | ICD-10-CM | POA: Insufficient documentation

## 2017-01-04 DIAGNOSIS — Z8249 Family history of ischemic heart disease and other diseases of the circulatory system: Secondary | ICD-10-CM | POA: Diagnosis not present

## 2017-01-04 DIAGNOSIS — M109 Gout, unspecified: Secondary | ICD-10-CM | POA: Insufficient documentation

## 2017-01-04 DIAGNOSIS — Z8669 Personal history of other diseases of the nervous system and sense organs: Secondary | ICD-10-CM | POA: Diagnosis not present

## 2017-01-04 DIAGNOSIS — Z79899 Other long term (current) drug therapy: Secondary | ICD-10-CM | POA: Insufficient documentation

## 2017-01-04 DIAGNOSIS — Z833 Family history of diabetes mellitus: Secondary | ICD-10-CM | POA: Diagnosis not present

## 2017-01-04 DIAGNOSIS — F17210 Nicotine dependence, cigarettes, uncomplicated: Secondary | ICD-10-CM | POA: Insufficient documentation

## 2017-01-04 DIAGNOSIS — K429 Umbilical hernia without obstruction or gangrene: Secondary | ICD-10-CM | POA: Insufficient documentation

## 2017-01-04 DIAGNOSIS — Z791 Long term (current) use of non-steroidal anti-inflammatories (NSAID): Secondary | ICD-10-CM | POA: Diagnosis not present

## 2017-01-04 DIAGNOSIS — Z803 Family history of malignant neoplasm of breast: Secondary | ICD-10-CM | POA: Insufficient documentation

## 2017-01-04 DIAGNOSIS — K219 Gastro-esophageal reflux disease without esophagitis: Secondary | ICD-10-CM | POA: Insufficient documentation

## 2017-01-04 HISTORY — DX: Unspecified convulsions: R56.9

## 2017-01-04 HISTORY — DX: Cardiac arrhythmia, unspecified: I49.9

## 2017-01-04 HISTORY — PX: UMBILICAL HERNIA REPAIR: SHX196

## 2017-01-04 HISTORY — PX: INSERTION OF MESH: SHX5868

## 2017-01-04 SURGERY — REPAIR, HERNIA, UMBILICAL, ADULT
Anesthesia: General | Wound class: Clean

## 2017-01-04 MED ORDER — MIDAZOLAM HCL 2 MG/2ML IJ SOLN
INTRAMUSCULAR | Status: AC
  Filled 2017-01-04: qty 2

## 2017-01-04 MED ORDER — MIDAZOLAM HCL 2 MG/2ML IJ SOLN
INTRAMUSCULAR | Status: DC | PRN
  Administered 2017-01-04: 2 mg via INTRAVENOUS

## 2017-01-04 MED ORDER — SUGAMMADEX SODIUM 200 MG/2ML IV SOLN
INTRAVENOUS | Status: DC | PRN
  Administered 2017-01-04: 200 mg via INTRAVENOUS

## 2017-01-04 MED ORDER — BUPIVACAINE-EPINEPHRINE (PF) 0.5% -1:200000 IJ SOLN
INTRAMUSCULAR | Status: DC | PRN
  Administered 2017-01-04: 30 mL

## 2017-01-04 MED ORDER — FENTANYL CITRATE (PF) 100 MCG/2ML IJ SOLN
INTRAMUSCULAR | Status: AC
  Filled 2017-01-04: qty 2

## 2017-01-04 MED ORDER — ACETAMINOPHEN 10 MG/ML IV SOLN
INTRAVENOUS | Status: AC
Start: 2017-01-04 — End: ?
  Filled 2017-01-04: qty 100

## 2017-01-04 MED ORDER — DEXAMETHASONE SODIUM PHOSPHATE 10 MG/ML IJ SOLN
INTRAMUSCULAR | Status: DC | PRN
  Administered 2017-01-04: 4 mg via INTRAVENOUS

## 2017-01-04 MED ORDER — LACTATED RINGERS IV SOLN
INTRAVENOUS | Status: DC
  Administered 2017-01-04: 12:00:00 via INTRAVENOUS

## 2017-01-04 MED ORDER — SUCCINYLCHOLINE CHLORIDE 20 MG/ML IJ SOLN
INTRAMUSCULAR | Status: DC | PRN
  Administered 2017-01-04: 100 mg via INTRAVENOUS

## 2017-01-04 MED ORDER — PROPOFOL 10 MG/ML IV BOLUS
INTRAVENOUS | Status: DC | PRN
  Administered 2017-01-04: 200 mg via INTRAVENOUS

## 2017-01-04 MED ORDER — SUGAMMADEX SODIUM 200 MG/2ML IV SOLN
INTRAVENOUS | Status: AC
  Filled 2017-01-04: qty 2

## 2017-01-04 MED ORDER — KETOROLAC TROMETHAMINE 30 MG/ML IJ SOLN
INTRAMUSCULAR | Status: DC | PRN
  Administered 2017-01-04: 30 mg via INTRAVENOUS

## 2017-01-04 MED ORDER — OXYCODONE HCL 5 MG PO TABS: 5.0000 mg | ORAL_TABLET | Freq: Once | ORAL | Status: DC | PRN

## 2017-01-04 MED ORDER — IBUPROFEN 600 MG PO TABS: 600.0000 mg | ORAL_TABLET | Freq: Three times a day (TID) | ORAL | 0 refills | Status: DC | PRN

## 2017-01-04 MED ORDER — ACETAMINOPHEN 500 MG PO TABS
ORAL_TABLET | ORAL | Status: AC
  Filled 2017-01-04: qty 2

## 2017-01-04 MED ORDER — CHLORHEXIDINE GLUCONATE CLOTH 2 % EX PADS: 6.0000 | MEDICATED_PAD | Freq: Once | CUTANEOUS | Status: DC

## 2017-01-04 MED ORDER — FENTANYL CITRATE (PF) 100 MCG/2ML IJ SOLN
INTRAMUSCULAR | Status: DC | PRN
  Administered 2017-01-04: 100 ug via INTRAVENOUS
  Administered 2017-01-04: 50 ug via INTRAVENOUS

## 2017-01-04 MED ORDER — BUPIVACAINE-EPINEPHRINE (PF) 0.5% -1:200000 IJ SOLN
INTRAMUSCULAR | Status: AC
  Filled 2017-01-04: qty 30

## 2017-01-04 MED ORDER — PHENYLEPHRINE HCL 10 MG/ML IJ SOLN
INTRAMUSCULAR | Status: DC | PRN
  Administered 2017-01-04 (×2): 100 ug via INTRAVENOUS

## 2017-01-04 MED ORDER — ROCURONIUM BROMIDE 50 MG/5ML IV SOLN
INTRAVENOUS | Status: AC
  Filled 2017-01-04: qty 1

## 2017-01-04 MED ORDER — PROPOFOL 10 MG/ML IV BOLUS
INTRAVENOUS | Status: AC
  Filled 2017-01-04: qty 20

## 2017-01-04 MED ORDER — ACETAMINOPHEN 10 MG/ML IV SOLN
INTRAVENOUS | Status: DC | PRN
  Administered 2017-01-04: 1000 mg via INTRAVENOUS

## 2017-01-04 MED ORDER — CEFAZOLIN SODIUM-DEXTROSE 2-4 GM/100ML-% IV SOLN
INTRAVENOUS | Status: AC
  Administered 2017-01-04: 2 g via INTRAVENOUS
  Filled 2017-01-04: qty 100

## 2017-01-04 MED ORDER — OXYCODONE HCL 5 MG/5ML PO SOLN: 5.0000 mg | Freq: Once | ORAL | Status: DC | PRN

## 2017-01-04 MED ORDER — FENTANYL CITRATE (PF) 100 MCG/2ML IJ SOLN: 25.0000 ug | INTRAMUSCULAR | Status: DC | PRN

## 2017-01-04 MED ORDER — ROCURONIUM BROMIDE 100 MG/10ML IV SOLN
INTRAVENOUS | Status: DC | PRN
  Administered 2017-01-04: 40 mg via INTRAVENOUS
  Administered 2017-01-04 (×2): 5 mg via INTRAVENOUS

## 2017-01-04 MED ORDER — GABAPENTIN 300 MG PO CAPS
ORAL_CAPSULE | ORAL | Status: AC
  Filled 2017-01-04: qty 1

## 2017-01-04 MED ORDER — OXYCODONE HCL 5 MG PO TABS: 5.0000 mg | ORAL_TABLET | Freq: Four times a day (QID) | ORAL | 0 refills | Status: DC | PRN

## 2017-01-04 MED ORDER — ONDANSETRON HCL 4 MG/2ML IJ SOLN
INTRAMUSCULAR | Status: DC | PRN
  Administered 2017-01-04: 4 mg via INTRAVENOUS

## 2017-01-04 MED ORDER — ONDANSETRON HCL 4 MG/2ML IJ SOLN
INTRAMUSCULAR | Status: AC
  Filled 2017-01-04: qty 2

## 2017-01-04 SURGICAL SUPPLY — 25 items
BLADE CLIPPER SURG (BLADE) IMPLANT
BLADE SURG 15 STRL LF DISP TIS (BLADE) ×1 IMPLANT
BLADE SURG 15 STRL SS (BLADE) ×2
CANISTER SUCT 1200ML W/VALVE (MISCELLANEOUS) ×3 IMPLANT
CHLORAPREP W/TINT 26ML (MISCELLANEOUS) ×3 IMPLANT
DERMABOND ADVANCED (GAUZE/BANDAGES/DRESSINGS) ×2
DERMABOND ADVANCED .7 DNX12 (GAUZE/BANDAGES/DRESSINGS) ×1 IMPLANT
DRAPE PED LAPAROTOMY (DRAPES) ×3 IMPLANT
DRSG TELFA 3X8 NADH (GAUZE/BANDAGES/DRESSINGS) IMPLANT
ELECT REM PT RETURN 9FT ADLT (ELECTROSURGICAL) ×3
ELECTRODE REM PT RTRN 9FT ADLT (ELECTROSURGICAL) ×1 IMPLANT
GLOVE PROTEXIS LATEX SZ 7.5 (GLOVE) ×3 IMPLANT
GLOVE SURG SYN 7.0 (GLOVE) ×12 IMPLANT
GOWN STRL REUS W/ TWL LRG LVL3 (GOWN DISPOSABLE) ×3 IMPLANT
GOWN STRL REUS W/TWL LRG LVL3 (GOWN DISPOSABLE) ×6
MESH VENTRALEX ST 2.5 CRC MED (Mesh General) ×3 IMPLANT
NEEDLE HYPO 22GX1.5 SAFETY (NEEDLE) ×3 IMPLANT
NS IRRIG 500ML POUR BTL (IV SOLUTION) ×3 IMPLANT
PACK BASIN MINOR ARMC (MISCELLANEOUS) ×3 IMPLANT
SUT ETHIBOND 0 MO6 C/R (SUTURE) ×3 IMPLANT
SUT MNCRL AB 4-0 PS2 18 (SUTURE) ×3 IMPLANT
SUT PROLENE 2 0 SH DA (SUTURE) ×3 IMPLANT
SUT VIC AB 3-0 SH 27 (SUTURE) ×4
SUT VIC AB 3-0 SH 27X BRD (SUTURE) ×2 IMPLANT
SYR 20CC LL (SYRINGE) ×3 IMPLANT

## 2017-01-04 NOTE — Progress Notes (Signed)
On arrival to pacu, patient was 80 percent with oral Airway in place, jaw lift performed and placed a  Size 7 nasal trumpet with suction.  Oxygen saturation Came up to 95 percent.

## 2017-01-04 NOTE — Anesthesia Preprocedure Evaluation (Signed)
Anesthesia Evaluation  Patient identified by MRN, date of birth, ID band Patient awake    Reviewed: Allergy & Precautions, H&P , NPO status , Patient's Chart, lab work & pertinent test results  History of Anesthesia Complications Negative for: history of anesthetic complications  Airway Mallampati: III  TM Distance: >3 FB Neck ROM: full    Dental  (+) Poor Dentition, Chipped   Pulmonary neg shortness of breath, Current Smoker,    Pulmonary exam normal breath sounds clear to auscultation       Cardiovascular Exercise Tolerance: Good hypertension, (-) angina(-) DOE Normal cardiovascular exam+ dysrhythmias  Rhythm:regular Rate:Normal     Neuro/Psych Seizures -,  negative psych ROS   GI/Hepatic Neg liver ROS, GERD  Medicated and Controlled,  Endo/Other  negative endocrine ROS  Renal/GU      Musculoskeletal   Abdominal   Peds  Hematology negative hematology ROS (+)   Anesthesia Other Findings Past Medical History: 12/11/2016: Diverticulosis large intestine w/o perforation* No date: Dysrhythmia     Comment: tacchycardia new..  put on metoprolol by dr.               Juliann Parescallwood for surgery No date: GERD (gastroesophageal reflux disease) No date: Gout No date: Hypertension No date: Seizures (HCC)     Comment: last one was 8 years ago. alcoholic seizures.               stopped drinking. No date: Umbilical hernia  Past Surgical History: 2014: ESOPHAGOGASTRODUODENOSCOPY ENDOSCOPY  BMI    Body Mass Index:  32.06 kg/m      Reproductive/Obstetrics negative OB ROS                             Anesthesia Physical Anesthesia Plan  ASA: III  Anesthesia Plan: General ETT   Post-op Pain Management:    Induction:   Airway Management Planned:   Additional Equipment:   Intra-op Plan:   Post-operative Plan:   Informed Consent: I have reviewed the patients History and Physical, chart,  labs and discussed the procedure including the risks, benefits and alternatives for the proposed anesthesia with the patient or authorized representative who has indicated his/her understanding and acceptance.   Dental Advisory Given  Plan Discussed with: Anesthesiologist, CRNA and Surgeon  Anesthesia Plan Comments:         Anesthesia Quick Evaluation

## 2017-01-04 NOTE — Interval H&P Note (Signed)
History and Physical Interval Note:  01/04/2017 11:43 AM  Ronald GallusJohn E Auker  has presented today for surgery, with the diagnosis of umbilical hernia  The various methods of treatment have been discussed with the patient and family. After consideration of risks, benefits and other options for treatment, the patient has consented to  Procedure(s): HERNIA REPAIR UMBILICAL ADULT with mesh (N/A) INSERTION OF MESH (N/A) as a surgical intervention .  The patient's history has been reviewed, patient examined, no change in status, stable for surgery.  I have reviewed the patient's chart and labs.  Questions were answered to the patient's satisfaction.     [name]

## 2017-01-04 NOTE — Anesthesia Procedure Notes (Signed)
Procedure Name: Intubation Performed by: Lance Muss Pre-anesthesia Checklist: Patient identified, Patient being monitored, Timeout performed, Emergency Drugs available and Suction available Patient Re-evaluated:Patient Re-evaluated prior to inductionOxygen Delivery Method: Circle system utilized Preoxygenation: Pre-oxygenation with 100% oxygen Intubation Type: IV induction Ventilation: Mask ventilation without difficulty Laryngoscope Size: Mac and 4 Grade View: Grade I Tube type: Oral Tube size: 7.5 mm Number of attempts: 1 Airway Equipment and Method: Stylet Placement Confirmation: ETT inserted through vocal cords under direct vision,  positive ETCO2 and breath sounds checked- equal and bilateral Secured at: 21 cm Tube secured with: Tape Dental Injury: Teeth and Oropharynx as per pre-operative assessment

## 2017-01-04 NOTE — Transfer of Care (Signed)
Immediate Anesthesia Transfer of Care Note  Patient: Audrie GallusJohn E Suppa  Procedure(s) Performed: Procedure(s): HERNIA REPAIR UMBILICAL ADULT with mesh (N/A) INSERTION OF MESH (N/A)  Patient Location: PACU  Anesthesia Type:General  Level of Consciousness: sedated and responds to stimulation  Airway & Oxygen Therapy: Patient Spontanous Breathing and Patient connected to face mask oxygen  Post-op Assessment: Report given to RN and Post -op Vital signs reviewed and stable  Post vital signs: Reviewed and stable  Last Vitals:  Vitals:   01/04/17 1326 01/04/17 1331  BP: 117/68 117/68  Pulse: 87 94  Resp: 14 15  Temp: 36.7 C     Last Pain:  Vitals:   01/04/17 1031  TempSrc: Tympanic         Complications: No apparent anesthesia complications

## 2017-01-04 NOTE — Anesthesia Postprocedure Evaluation (Signed)
Anesthesia Post Note  Patient: [name]  Procedure(s) Performed: Procedure(s) (LRB): HERNIA REPAIR UMBILICAL ADULT with mesh (N/A) INSERTION OF MESH (N/A)  Patient location during evaluation: PACU Anesthesia Type: General Level of consciousness: awake and alert Pain management: pain level controlled Vital Signs Assessment: post-procedure vital signs reviewed and stable Respiratory status: spontaneous breathing, nonlabored ventilation, respiratory function stable and patient connected to nasal cannula oxygen Cardiovascular status: blood pressure returned to baseline and stable Postop Assessment: no signs of nausea or vomiting Anesthetic complications: no     Last Vitals:  Vitals:   01/04/17 1409 01/04/17 1427  BP: 125/87 129/71  Pulse: 83 80  Resp: 18 16  Temp: 36.5 C 36.4 C    Last Pain:  Vitals:   01/04/17 1427  TempSrc: Temporal  PainSc: 2                  Cleda MccreedyJoseph K Dilia Alemany

## 2017-01-04 NOTE — Progress Notes (Signed)
Dr. Randa NgoPiscitello coming to evaluate prior to taking patient Around to post op.

## 2017-01-04 NOTE — H&P (View-Only) (Signed)
12/23/2016  Reason for Visit:  Umbilical hernia  History of Present Illness: Ronald Pratt is a 41 y.o. male who was seen in the emergency room on 12/11/16 with abdominal pain in the periumbilical area.  He had a CAT scan of the abdomen and pelvis which showed an umbilical hernia containing fat only. His pain improved while in the emergency room and was discharged to home. Of note he was also noted to have a left renal lesion or mass and radiology recommended follow-up with an MRI.  The patient reports that he's had this umbilical hernia for about 2 years, and has had terminated episodes of pain in the periumbilical area particularly as the hernia bulges out more. He is reported some episodes of nausea in the past but no episodes of emesis. He denies having any fevers or chills. Denies having any other areas of abdominal pain. Denies having any constipation. He reports that he's been on a diet with his girlfriend and has been losing a significant amount of weight about 30 pounds over the last few months and that has improved with pain symptoms from his hernia as there is less pressure on it. He does heavy lifting for work and does have more discomfort at the end of a long day.  Past Medical History: Past Medical History:  Diagnosis Date  . Diverticulosis large intestine w/o perforation or abscess w/o bleeding 12/11/2016  . Gout   . Hypertension   . Umbilical hernia      Past Surgical History: --None  Home Medications: Prior to Admission medications   Medication Sig Start Date End Date Taking? Authorizing Provider  docusate sodium (COLACE) 100 MG capsule Take 2 capsules (200 mg total) by mouth 2 (two) times daily. 12/11/16   Sharman Cheek, MD  Chilton Si Tea, Camillia sinensis, ([employer] SUPER GREEN TEA EXTRACT) 250 MG CAPS Take 2 capsules by mouth 2 (two) times daily.    Historical Provider, MD  meloxicam (MOBIC) 15 MG tablet Take 1 tablet (15 mg total) by mouth daily. 11/26/15   Delorise Royals Cuthriell,  PA-C  naproxen (NAPROSYN) 500 MG tablet Take 1 tablet (500 mg total) by mouth 2 (two) times daily with a meal. 12/11/16   Sharman Cheek, MD    Allergies: No Known Allergies  Social History:  reports that he has never smoked. He has never used smokeless tobacco. He reports that he does not drink alcohol and quit 8 years ago. His drug history is not on file.   Family History: Family History  Problem Relation Age of Onset  . Breast cancer Mother   . Heart disease Mother   . Prostate cancer Father   . Colon cancer Father   . Diabetes Sister   . Heart disease Sister     Review of Systems: Review of Systems  Constitutional: Negative for chills and fever.  HENT: Negative for hearing loss.   Eyes: Negative for blurred vision.  Respiratory: Negative for cough.   Cardiovascular: Negative for chest pain and leg swelling.  Gastrointestinal: Positive for abdominal pain. Negative for constipation, diarrhea, nausea and vomiting.  Genitourinary: Negative for dysuria and hematuria.  Musculoskeletal: Negative for myalgias.  Skin: Negative for rash.  Neurological: Negative for dizziness.  Psychiatric/Behavioral: Negative for depression.  All other systems reviewed and are negative.   Physical Exam BP (!) 158/101   Pulse 83   Temp 98 F (36.7 C) (Oral)   Ht 6\' 1"  (1.854 m)   Wt 110.8 kg (244 lb 3.2 oz)  BMI 32.22 kg/m  CONSTITUTIONAL: No acute distress HEENT:  Normocephalic, atraumatic, extraocular motion intact. NECK: Trachea is midline, and there is no jugular venous distension.  RESPIRATORY:  Lungs are clear, and breath sounds are equal bilaterally. Normal respiratory effort without pathologic use of accessory muscles. CARDIOVASCULAR: Heart is regular without murmurs, gallops, or rubs. GI: The abdomen is soft, obese, nondistended, with mild tenderness to palpation over the umbilical region.  The patient has a fat containing umbilical hernia which is partially reducible. There  were no palpable masses.  MUSCULOSKELETAL:  Normal muscle strength and tone in all four extremities.  No peripheral edema or cyanosis. SKIN: Skin turgor is normal. There are no pathologic skin lesions.  NEUROLOGIC:  Motor and sensation is grossly normal.  Cranial nerves are grossly intact. PSYCH:  Alert and oriented to person, place and time. Affect is normal.  Laboratory Analysis: In the emergency room on 2/17, the patient had a WBC of 7.1, HCT 42.3, sodium 139, potassium 3.6, chloride 107, CO2 25, BUN 9, creatinine 1.18.  Imaging: CT scan on 2/17 shows an umbilical hernia containing only fat measuring about 2.2 cm in diameter. Patient also was found to have a 1.3 x 1.2 cm left renal mass which could not be classified as a cyst and MRI was recommended for follow-up.  Assessment and Plan: This is a 41 y.o. male who presents with a two-year history of an umbilical hernia with a recent visit to the emergency room for periumbilical pain.  -Discussed with the patient that we could potentially do conservative management with watchful waiting with his umbilical hernia although given that he's had intermittent episodes in the recent one that brought him to the emergency room, it would also be reasonable to go ahead and repair this hernia. The patient is opting for surgical management. Discussed with the patient and umbilical hernia repair with mesh and that we would use mesh given that he does heavy lifting for work to help reinforce the repair. Discussed with the patient the risks and benefits of the procedure including risk of bleeding, infection, injury to surrounding structures, as well as postop course and expectations as well as lifting restrictions afterwards. The patient is willing to proceed and has given informed consent. -Patient has a follow point with his PCP early in March during which she will discuss the findings on his CT scan as well as follow-up on his blood pressure which was elevated  today. We will schedule his surgery for mid March after being seen by his PCP.  Face-to-face time spent with the patient and care providers was 45 minutes, with more than 50% of the time spent counseling, educating, and coordinating care of the patient.     Howie IllJose Luis Brinn Westby, MD [facility]

## 2017-01-04 NOTE — Op Note (Signed)
Procedure Date:  01/04/2017  Pre-operative Diagnosis:  Umbilical Hernia  Post-operative Diagnosis:  Umbilical Herni  Procedure:  Umbilical hernia repair with Ventralex ST mesh  Surgeon:  Howie IllJose Luis Margia Wiesen, MD  Anesthesia:  General endotracheal  Estimated Blood Loss:  5 ml  Specimens:  Hernia sac  Complications:  None  Indications for Procedure:  This is a 41 y.o. male who presents with an umbilical hernia.  The risks of bleeding, abscess or infection, injury to surrounding structures, and need for further procedures were all discussed with the patient and was willing to proceed.  Description of Procedure: The patient was correctly identified in the preoperative area and brought into the operating room.  The patient was placed supine with VTE prophylaxis in place.  Appropriate time-outs were performed.  Anesthesia was induced and the patient was intubated.  Appropriate antibiotics were infused.  The abdomen was prepped and draped in a sterile fashion. A curvilinear infraumbilical incision was made measuring 6 cm. The subcutaneous tissues were dissected down using cautery along the umbilical stalk down to the umbilical defect. The fascial edges were dissected free inferiorly first. The umbilical stalk was then transected off of the fascia and the superior portion of the umbilical fascial edges were dissected free at that point. Hemostasis was assured using cautery at all times. The hernia sac was removed and sent as a specimen. The hernia defect measured approximately 1.8 cm. A medium-size ventralex ST hernia patch was placed posterior to the fascia through the defect and the tails were secured in place to the fascia using 2-0 Prolene suture. Then the umbilical defect was closed using multiple 0 Ethibond sutures in figure-of-eight fashion, incorporating the mesh with the fascial repair. The cavity was then irrigated and again hemostasis was assured with cautery. 30 mL of local anesthetic were  infused. The umbilical stalk was then sutured down to the fascia re-creating the umbilicus. The wound was then closed in multiple layers using 3-0 Vicryl and 4-0 Monocryl. The incision was then cleaned and sealed with Dermabond.  The patient was emerged from anesthesia and extubated and brought to the recovery room for further management.  The patient tolerated the procedure well and all counts were correct at the end of the case.   Howie IllJose Luis Sallyann Kinnaird, MD

## 2017-01-04 NOTE — Telephone Encounter (Signed)
Cardiac Clearance obtained at this time from DR.Callwood and will be scanned under Media.

## 2017-01-04 NOTE — Progress Notes (Signed)
Dr. Randa NgoPiscitello ok with proceeding to post op area.

## 2017-01-04 NOTE — Anesthesia Post-op Follow-up Note (Cosign Needed)
Anesthesia QCDR form completed.        

## 2017-01-04 NOTE — Progress Notes (Signed)
Patient pulling at his oxygen mask and pulling at the Nasal airway.

## 2017-01-05 ENCOUNTER — Encounter: Payer: Self-pay | Admitting: Surgery

## 2017-01-05 LAB — SURGICAL PATHOLOGY

## 2017-01-06 ENCOUNTER — Encounter: Payer: Self-pay | Admitting: Surgery

## 2017-01-18 ENCOUNTER — Other Ambulatory Visit: Payer: Self-pay

## 2017-01-18 DIAGNOSIS — I499 Cardiac arrhythmia, unspecified: Secondary | ICD-10-CM | POA: Insufficient documentation

## 2017-01-18 DIAGNOSIS — K219 Gastro-esophageal reflux disease without esophagitis: Secondary | ICD-10-CM | POA: Insufficient documentation

## 2017-01-18 DIAGNOSIS — M109 Gout, unspecified: Secondary | ICD-10-CM | POA: Insufficient documentation

## 2017-01-18 DIAGNOSIS — R569 Unspecified convulsions: Secondary | ICD-10-CM | POA: Insufficient documentation

## 2017-01-20 DIAGNOSIS — K429 Umbilical hernia without obstruction or gangrene: Secondary | ICD-10-CM | POA: Insufficient documentation

## 2017-01-20 DIAGNOSIS — I10 Essential (primary) hypertension: Secondary | ICD-10-CM

## 2017-01-20 DIAGNOSIS — K5730 Diverticulosis of large intestine without perforation or abscess without bleeding: Secondary | ICD-10-CM

## 2017-01-25 ENCOUNTER — Ambulatory Visit (INDEPENDENT_AMBULATORY_CARE_PROVIDER_SITE_OTHER): Payer: 59 | Admitting: Surgery

## 2017-01-25 ENCOUNTER — Encounter: Payer: Self-pay | Admitting: Surgery

## 2017-01-25 VITALS — BP 115/80 | HR 84 | Temp 98.1°F | Ht 73.0 in | Wt 242.4 lb

## 2017-01-25 DIAGNOSIS — Z09 Encounter for follow-up examination after completed treatment for conditions other than malignant neoplasm: Secondary | ICD-10-CM

## 2017-01-25 NOTE — Patient Instructions (Signed)
Please do not lift anything over 15 pounds until 02/01/17. You may resume normal activities after this date. Please call our office if you have any questions or concerns.

## 2017-01-25 NOTE — Progress Notes (Signed)
01/25/2017  HPI: Patient is status post umbilical hernia repair with mesh on 3/13. Presents today for postop follow-up. He reports doing very well with no significant pain over the incision, with no evidence or sensation of new bulging or herniation. Denies any nausea or vomiting, denies any fevers or chills. Reports normal appetite with normal bowel movements. He continues to walk 2 miles per day on his treadmill and has been continued to lose weight.  Vital signs: BP 115/80   Pulse 84   Temp 98.1 F (36.7 C) (Oral)   Ht  (1.854 m)   Wt 110 kg (242 lb 6.4 oz)   BMI 31.98 kg/m    Physical Exam: Constitutional: No acute distress Abdomen:  Soft, nondistended, nontender to palpation. Incision is clean dry and intact with no evidence of infection  Assessment/Plan: 41 year old male status post umbilical hernia repair with mesh.  -The patient currently is healing well with no complications postop. Pathology was reviewed with the patient, for hernia sac that was removed during the procedure. -Patient may resume his regular activities next week on 4/10. He may return to work without restrictions at that point. -Patient may follow-up on an as-needed basis.   Howie Ill, MD [facility]

## 2017-01-31 ENCOUNTER — Telehealth: Payer: Self-pay

## 2017-01-31 NOTE — Telephone Encounter (Signed)
Patient's disability form was filled out. Called patient to let him know that it was filled out, therefore, to come in and pick it up per his request.

## 2017-02-02 NOTE — Telephone Encounter (Signed)
Patient has picked up his paperwork

## 2017-02-02 NOTE — Telephone Encounter (Signed)
Disability Paperwork has been received and a $25.00 collection fee has been obtained.  

## 2018-02-02 ENCOUNTER — Emergency Department: Payer: PRIVATE HEALTH INSURANCE

## 2018-02-02 ENCOUNTER — Other Ambulatory Visit: Payer: Self-pay

## 2018-02-02 ENCOUNTER — Inpatient Hospital Stay
Admission: EM | Admit: 2018-02-02 | Discharge: 2018-02-04 | DRG: 292 | Disposition: A | Discharge status: Home / Self Care | Payer: PRIVATE HEALTH INSURANCE | Attending: Internal Medicine | Admitting: Internal Medicine

## 2018-02-02 ENCOUNTER — Encounter: Payer: Self-pay | Admitting: Emergency Medicine

## 2018-02-02 DIAGNOSIS — F17210 Nicotine dependence, cigarettes, uncomplicated: Secondary | ICD-10-CM | POA: Diagnosis present

## 2018-02-02 DIAGNOSIS — R609 Edema, unspecified: Secondary | ICD-10-CM

## 2018-02-02 DIAGNOSIS — I110 Hypertensive heart disease with heart failure: Secondary | ICD-10-CM | POA: Diagnosis not present

## 2018-02-02 DIAGNOSIS — Z79899 Other long term (current) drug therapy: Secondary | ICD-10-CM

## 2018-02-02 DIAGNOSIS — Z833 Family history of diabetes mellitus: Secondary | ICD-10-CM

## 2018-02-02 DIAGNOSIS — I509 Heart failure, unspecified: Secondary | ICD-10-CM

## 2018-02-02 DIAGNOSIS — E876 Hypokalemia: Secondary | ICD-10-CM | POA: Diagnosis present

## 2018-02-02 DIAGNOSIS — R079 Chest pain, unspecified: Secondary | ICD-10-CM

## 2018-02-02 DIAGNOSIS — I161 Hypertensive emergency: Secondary | ICD-10-CM | POA: Diagnosis present

## 2018-02-02 DIAGNOSIS — I5031 Acute diastolic (congestive) heart failure: Secondary | ICD-10-CM

## 2018-02-02 DIAGNOSIS — Z800 Family history of malignant neoplasm of digestive organs: Secondary | ICD-10-CM

## 2018-02-02 DIAGNOSIS — Z791 Long term (current) use of non-steroidal anti-inflammatories (NSAID): Secondary | ICD-10-CM

## 2018-02-02 DIAGNOSIS — Z803 Family history of malignant neoplasm of breast: Secondary | ICD-10-CM

## 2018-02-02 DIAGNOSIS — K219 Gastro-esophageal reflux disease without esophagitis: Secondary | ICD-10-CM | POA: Diagnosis present

## 2018-02-02 DIAGNOSIS — Z8249 Family history of ischemic heart disease and other diseases of the circulatory system: Secondary | ICD-10-CM

## 2018-02-02 DIAGNOSIS — Z9114 Patient's other noncompliance with medication regimen: Secondary | ICD-10-CM

## 2018-02-02 DIAGNOSIS — I5033 Acute on chronic diastolic (congestive) heart failure: Secondary | ICD-10-CM | POA: Diagnosis present

## 2018-02-02 DIAGNOSIS — Z716 Tobacco abuse counseling: Secondary | ICD-10-CM

## 2018-02-02 DIAGNOSIS — Z8042 Family history of malignant neoplasm of prostate: Secondary | ICD-10-CM

## 2018-02-02 DIAGNOSIS — M109 Gout, unspecified: Secondary | ICD-10-CM | POA: Diagnosis present

## 2018-02-02 HISTORY — DX: Heart failure, unspecified: I50.9

## 2018-02-02 LAB — CBC
HEMATOCRIT: 38.6 % — AB (ref 40.0–52.0)
HEMOGLOBIN: 13.5 g/dL (ref 13.0–18.0)
MCH: 37.4 pg — ABNORMAL HIGH (ref 26.0–34.0)
MCHC: 35 g/dL (ref 32.0–36.0)
MCV: 106.7 fL — AB (ref 80.0–100.0)
Platelets: 196 10*3/uL (ref 150–440)
RBC: 3.62 MIL/uL — AB (ref 4.40–5.90)
RDW: 17.2 % — ABNORMAL HIGH (ref 11.5–14.5)
WBC: 7.4 10*3/uL (ref 3.8–10.6)

## 2018-02-02 LAB — BASIC METABOLIC PANEL
Anion gap: 6 (ref 5–15)
BUN: 14 mg/dL (ref 6–20)
CHLORIDE: 108 mmol/L (ref 101–111)
CO2: 25 mmol/L (ref 22–32)
Calcium: 9 mg/dL (ref 8.9–10.3)
Creatinine, Ser: 0.99 mg/dL (ref 0.61–1.24)
GFR calc Af Amer: >60 mL/min (ref >60)
GFR calc non Af Amer: >60 mL/min (ref >60)
Glucose, Bld: 95 mg/dL (ref 65–99)
POTASSIUM: 3.3 mmol/L — AB (ref 3.5–5.1)
SODIUM: 139 mmol/L (ref 135–145)

## 2018-02-02 LAB — BRAIN NATRIURETIC PEPTIDE: B NATRIURETIC PEPTIDE 5: 68 pg/mL (ref 0.0–100.0)

## 2018-02-02 LAB — TROPONIN I: Troponin I: <0.03 ng/mL (ref <0.03)

## 2018-02-02 MED ORDER — FUROSEMIDE 10 MG/ML IJ SOLN
40.0000 mg | Freq: Once | INTRAMUSCULAR | Status: AC
  Administered 2018-02-03: 40 mg via INTRAVENOUS
  Filled 2018-02-02: qty 4

## 2018-02-02 NOTE — ED Provider Notes (Signed)
[facility] Emergency Department Provider Note   First MD Initiated Contact with Patient 02/02/18 2339     (approximate)  I have reviewed the triage vital signs and the nursing notes.   HISTORY  Chief Complaint Chest Pain    HPI Ronald Pratt is a 42 y.o. male with below list of chronic medical conditions presents to the emergency department with left-sided chest pain which radiates into his back times 3 days with associated dyspnea and bilateral lower extremity swelling.  Patient states his current pain score is 5 out of 10.  Patient does admit to smoking a pack of cigarettes per day.  Past Medical History:  Diagnosis Date  . Diverticulosis large intestine w/o perforation or abscess w/o bleeding 12/11/2016  . Dysrhythmia    tacchycardia new..  put on metoprolol by dr. Juliann Pares for surgery  . GERD (gastroesophageal reflux disease)   . Gout   . Hypertension   . 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  [name] Umbilical hernia     Patient Active Problem List   Diagnosis Date Noted  . CHF (congestive heart failure) (HCC) 02/03/2018  . Hypertension   . Umbilical hernia   . Seizures (HCC) 01/18/2017  . Gout 01/18/2017  . GERD (gastroesophageal reflux disease) 01/18/2017  . Dysrhythmia 01/18/2017  . Benign essential hypertension 12/29/2016  . Diverticulosis of colon without hemorrhage 12/20/2016  . Umbilical hernia without obstruction and without gangrene 12/20/2016  . Diverticulosis large intestine w/o perforation or abscess w/o bleeding 12/11/2016    Past Surgical History:  Procedure Laterality Date  . ESOPHAGOGASTRODUODENOSCOPY ENDOSCOPY  2014  . INSERTION OF MESH N/A 01/04/2017   Procedure: INSERTION OF MESH;  Surgeon: Henrene Dodge, MD;  Location: [facility] ORS;  Service: General;  Laterality: N/A;  . UMBILICAL HERNIA REPAIR N/A 01/04/2017   Procedure: HERNIA REPAIR UMBILICAL ADULT with mesh;  Surgeon: Henrene Dodge, MD;  Location: [facility] ORS;  Service: General;  Laterality: N/A;    Prior to Admission medications   Medication Sig Start Date End Date Taking? Authorizing Provider  losartan-hydrochlorothiazide (HYZAAR) 50-12.5 MG tablet Take 1 tablet by mouth 1 day or 1 dose. 12/29/16 02/03/18 Yes [provider]  docusate sodium (COLACE) 100 MG capsule Take 2 capsules (200 mg total) by mouth 2 (two) times daily. Patient not taking: Reported on 02/03/2018 12/11/16   Sharman Cheek, MD  ibuprofen (ADVIL,MOTRIN) 600 MG tablet Take 1 tablet (600 mg total) by mouth every 8 (eight) hours as needed for fever or mild pain. Patient not taking: Reported on 02/03/2018 01/04/17   Henrene Dodge, MD  meloxicam (MOBIC) 15 MG tablet Take 1 tablet (15 mg total) by mouth daily. Patient not taking: Reported on 02/03/2018 11/26/15   Cuthriell, Delorise Royals, PA-C  metoprolol succinate (TOPROL-XL) 50 MG 24 hr tablet Take 50 mg by mouth every morning. Take with or immediately following a meal.    [provider]  naproxen (NAPROSYN) 500 MG tablet Take 1 tablet (500 mg total) by mouth 2 (two) times daily with a meal. Patient not taking: Reported on 02/03/2018 12/11/16   Sharman Cheek, MD  omeprazole (PRILOSEC) 20 MG capsule Take 1 capsule by mouth 1 day or 1 dose. 12/29/16 12/29/17  [provider]  ranitidine (ZANTAC) 150 MG tablet Take 150 mg by mouth as needed for heartburn.    [provider]    Allergies No known drug allergies  Family History  Problem  Relation Age of Onset  . Breast cancer Mother   . Heart disease Mother   . Prostate cancer Father   . Colon cancer Father   . Diabetes Sister   . Heart disease Sister     Social History Social History   Tobacco Use  . Smoking status: Current Every Day Smoker    Packs/day: 1.00    Years: 20.00    Pack years: 20.00    Types: Cigarettes  . Smokeless tobacco: Never Used  Substance Use Topics  . 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 Drug use: No    Review of Systems Constitutional: No fever/chills Eyes: No visual changes. ENT: No sore throat. Cardiovascular: Denies chest pain. Respiratory: Denies shortness of breath. Gastrointestinal: No abdominal pain.  No nausea, no vomiting.  No diarrhea.  No constipation. Genitourinary: Negative for dysuria. Musculoskeletal: Negative for neck pain.  Negative for back pain. Integumentary: Negative for rash. Neurological: Negative for headaches, focal weakness or numbness.   ____________________________________________   PHYSICAL EXAM:  VITAL SIGNS: ED Triage Vitals  Enc Vitals Group     BP 02/02/18 1956 (!) 190/118     Pulse Rate 02/02/18 1956 (!) 110     Resp 02/02/18 1956 (!) 22     Temp 02/02/18 1956 98.7 F (37.1 C)     Temp Source 02/02/18 1956 Oral     SpO2 02/02/18 1956 97 %     Weight 02/02/18 1952 111.6 kg (246 lb)     Height 02/02/18 1952 1.854 m (6\' 1" )     Head Circumference --      Peak Flow --      Pain Score 02/02/18 1952 6     Pain Loc --      Pain Edu? --      Excl. in GC? --     Constitutional: Alert and oriented. Well appearing and in no acute distress. Eyes: Conjunctivae are normal.  Head: Atraumatic. Mouth/Throat: Mucous membranes are moist. Oropharynx non-erythematous. Neck: No stridor.  Cardiovascular: Normal rate, regular rhythm. Good peripheral circulation. Grossly normal heart sounds. Respiratory: Normal respiratory effort.  No retractions. Lungs CTAB. Gastrointestinal: Soft and nontender. No distention. Musculoskeletal: 1-2+ bilateral lower extremity pitting edema no gross deformities of extremities. Neurologic:  Normal speech and language. No gross focal neurologic deficits are appreciated.  Skin:  Skin is warm, dry and intact. No rash noted. Psychiatric: Mood and affect are normal. Speech and behavior are normal.  ____________________________________________   LABS (all labs ordered are listed, but only  abnormal results are displayed)  Labs Reviewed  BASIC METABOLIC PANEL - Abnormal; Notable for the following components:      Result Value   Potassium 3.3 (*)    All other components within normal limits  CBC - Abnormal; Notable for the following components:   RBC 3.62 (*)    HCT 38.6 (*)    MCV 106.7 (*)    MCH 37.4 (*)    RDW 17.2 (*)    All other components within normal limits  TROPONIN I  BRAIN NATRIURETIC PEPTIDE  FIBRIN DERIVATIVES D-DIMER ([facility] ONLY)  TROPONIN I   ____________________________________________  EKG  ED ECG REPORT I, [place] N BROWN, the attending physician, personally viewed and interpreted this ECG.   Date: 02/03/2018  EKG Time: 7:55 PM  Rate: 109  Rhythm: Normal sinus rhythm with left ventricular hypertrophy  Axis: Normal  Intervals: Normal  ST&T Change: None  ____________________________________________  RADIOLOGY I,  [place] N BROWN, personally viewed and evaluated these images (plain radiographs) as part of my medical decision making, as well as reviewing the written report by the radiologist.  ED MD interpretation: No active cardiopulmonary disease per radiologist  Official radiology report(s): Dg Chest 2 View  Result Date: 02/02/2018 CLINICAL DATA:  Chest pain EXAM: CHEST - 2 VIEW COMPARISON:  01/16/2010 FINDINGS: The heart size and mediastinal contours are within normal limits. Both lungs are clear. The visualized skeletal structures are unremarkable. IMPRESSION: No active cardiopulmonary disease. Electronically Signed   By: Jasmine Pang M.D.   On: 02/02/2018 20:15    ____________________________________________     Procedures   ____________________________________________   INITIAL IMPRESSION / ASSESSMENT AND PLAN / ED COURSE  As part of my medical decision making, I reviewed the following data within the electronic MEDICAL RECORD NUMBER   Male presenting with above-stated history of dyspnea with bilateral lower extremity  pitting edema as well as chest pain.  Concern for possible new onset congestive heart failure.  Patient noted to be markedly hypertensive as well.  Patient given 40 mg IV Lasix patient discussed with Dr. Stacie Acres for hospital admission further evaluation and management.    ____________________________________________  FINAL CLINICAL IMPRESSION(S) / ED DIAGNOSES  Final diagnoses:  Chest pain, unspecified type  Peripheral edema  Congestive heart failure  MEDICATIONS GIVEN DURING THIS VISIT:  Medications  furosemide (LASIX) injection 40 mg (40 mg Intravenous Given 02/03/18 0009)  aspirin chewable tablet 324 mg (324 mg Oral Given 02/03/18 0017)     ED Discharge Orders    None       Note:  This document was prepared using Dragon voice recognition software and may include unintentional dictation errors.    Darci Current, MD 02/03/18 [phone]

## 2018-02-02 NOTE — ED Notes (Signed)
Chest pain x 3 days and leg swelling x 2 weeks. Noticeably short of breath when talking with Dr Manson PasseyBrown

## 2018-02-02 NOTE — ED Triage Notes (Signed)
Patient ambulatory to triage with steady gait, without difficulty or distress noted; pt reports left sided CP radiating around into back x 3 days; also [facility]; denies hx of same; +smoker

## 2018-02-03 ENCOUNTER — Inpatient Hospital Stay (HOSPITAL_COMMUNITY)
Admit: 2018-02-03 | Discharge: 2018-02-03 | Disposition: A | Discharge status: Home / Self Care | Payer: PRIVATE HEALTH INSURANCE | Attending: Internal Medicine | Admitting: Internal Medicine

## 2018-02-03 ENCOUNTER — Inpatient Hospital Stay: Payer: PRIVATE HEALTH INSURANCE

## 2018-02-03 ENCOUNTER — Other Ambulatory Visit: Payer: Self-pay

## 2018-02-03 DIAGNOSIS — I5033 Acute on chronic diastolic (congestive) heart failure: Secondary | ICD-10-CM

## 2018-02-03 DIAGNOSIS — I340 Nonrheumatic mitral (valve) insufficiency: Secondary | ICD-10-CM | POA: Diagnosis not present

## 2018-02-03 DIAGNOSIS — Z803 Family history of malignant neoplasm of breast: Secondary | ICD-10-CM | POA: Diagnosis not present

## 2018-02-03 DIAGNOSIS — M109 Gout, unspecified: Secondary | ICD-10-CM | POA: Diagnosis present

## 2018-02-03 DIAGNOSIS — R609 Edema, unspecified: Secondary | ICD-10-CM | POA: Diagnosis present

## 2018-02-03 DIAGNOSIS — I509 Heart failure, unspecified: Secondary | ICD-10-CM

## 2018-02-03 DIAGNOSIS — Z79899 Other long term (current) drug therapy: Secondary | ICD-10-CM | POA: Diagnosis not present

## 2018-02-03 DIAGNOSIS — F17210 Nicotine dependence, cigarettes, uncomplicated: Secondary | ICD-10-CM | POA: Diagnosis present

## 2018-02-03 DIAGNOSIS — K219 Gastro-esophageal reflux disease without esophagitis: Secondary | ICD-10-CM | POA: Diagnosis present

## 2018-02-03 DIAGNOSIS — Z9114 Patient's other noncompliance with medication regimen: Secondary | ICD-10-CM | POA: Diagnosis not present

## 2018-02-03 DIAGNOSIS — Z800 Family history of malignant neoplasm of digestive organs: Secondary | ICD-10-CM | POA: Diagnosis not present

## 2018-02-03 DIAGNOSIS — Z833 Family history of diabetes mellitus: Secondary | ICD-10-CM | POA: Diagnosis not present

## 2018-02-03 DIAGNOSIS — I110 Hypertensive heart disease with heart failure: Secondary | ICD-10-CM | POA: Diagnosis present

## 2018-02-03 DIAGNOSIS — I161 Hypertensive emergency: Secondary | ICD-10-CM | POA: Diagnosis present

## 2018-02-03 DIAGNOSIS — Z716 Tobacco abuse counseling: Secondary | ICD-10-CM | POA: Diagnosis not present

## 2018-02-03 DIAGNOSIS — E876 Hypokalemia: Secondary | ICD-10-CM | POA: Diagnosis present

## 2018-02-03 DIAGNOSIS — Z8249 Family history of ischemic heart disease and other diseases of the circulatory system: Secondary | ICD-10-CM | POA: Diagnosis not present

## 2018-02-03 DIAGNOSIS — Z791 Long term (current) use of non-steroidal anti-inflammatories (NSAID): Secondary | ICD-10-CM | POA: Diagnosis not present

## 2018-02-03 DIAGNOSIS — Z8042 Family history of malignant neoplasm of prostate: Secondary | ICD-10-CM | POA: Diagnosis not present

## 2018-02-03 LAB — BASIC METABOLIC PANEL
Anion gap: 7 (ref 5–15)
BUN: 14 mg/dL (ref 6–20)
CALCIUM: 9.1 mg/dL (ref 8.9–10.3)
CO2: 28 mmol/L (ref 22–32)
CREATININE: 1.07 mg/dL (ref 0.61–1.24)
Chloride: 102 mmol/L (ref 101–111)
GFR calc Af Amer: >60 mL/min (ref >60)
Glucose, Bld: 106 mg/dL — ABNORMAL HIGH (ref 65–99)
Potassium: 3.8 mmol/L (ref 3.5–5.1)
Sodium: 137 mmol/L (ref 135–145)

## 2018-02-03 LAB — CBC
HCT: 41.3 % (ref 40.0–52.0)
Hemoglobin: 14.2 g/dL (ref 13.0–18.0)
MCH: 36.5 pg — AB (ref 26.0–34.0)
MCHC: 34.3 g/dL (ref 32.0–36.0)
MCV: 106.5 fL — ABNORMAL HIGH (ref 80.0–100.0)
Platelets: 212 10*3/uL (ref 150–440)
RBC: 3.88 MIL/uL — ABNORMAL LOW (ref 4.40–5.90)
RDW: 17.3 % — AB (ref 11.5–14.5)
WBC: 7 10*3/uL (ref 3.8–10.6)

## 2018-02-03 LAB — TROPONIN I

## 2018-02-03 LAB — ECHOCARDIOGRAM COMPLETE
HEIGHTINCHES: 73 in
WEIGHTICAEL: 4072 [oz_av]

## 2018-02-03 LAB — FIBRIN DERIVATIVES D-DIMER (ARMC ONLY): FIBRIN DERIVATIVES D-DIMER (ARMC): 1348.39 ng{FEU}/mL — AB (ref 0.00–499.00)

## 2018-02-03 LAB — GLUCOSE, CAPILLARY: GLUCOSE-CAPILLARY: 136 mg/dL — AB (ref 65–99)

## 2018-02-03 MED ORDER — FUROSEMIDE 10 MG/ML IJ SOLN
40.0000 mg | Freq: Two times a day (BID) | INTRAMUSCULAR | Status: DC
  Administered 2018-02-03 – 2018-02-04 (×3): 40 mg via INTRAVENOUS
  Filled 2018-02-03 (×3): qty 4

## 2018-02-03 MED ORDER — ACETAMINOPHEN 650 MG RE SUPP: 650.0000 mg | Freq: Four times a day (QID) | RECTAL | Status: DC | PRN

## 2018-02-03 MED ORDER — FAMOTIDINE 20 MG PO TABS
10.0000 mg | ORAL_TABLET | Freq: Every day | ORAL | Status: DC
  Administered 2018-02-03 – 2018-02-04 (×2): 10 mg via ORAL
  Filled 2018-02-03 (×2): qty 1

## 2018-02-03 MED ORDER — HYDROCHLOROTHIAZIDE 12.5 MG PO CAPS
12.5000 mg | ORAL_CAPSULE | Freq: Every day | ORAL | Status: DC
  Administered 2018-02-03: 12.5 mg via ORAL
  Filled 2018-02-03: qty 1

## 2018-02-03 MED ORDER — NITROGLYCERIN 2 % TD OINT
0.5000 [in_us] | TOPICAL_OINTMENT | Freq: Once | TRANSDERMAL | Status: AC
  Administered 2018-02-03: 0.5 [in_us] via TOPICAL
  Filled 2018-02-03: qty 1

## 2018-02-03 MED ORDER — LOSARTAN POTASSIUM 50 MG PO TABS
100.0000 mg | ORAL_TABLET | Freq: Every day | ORAL | Status: DC
  Administered 2018-02-03: 100 mg via ORAL
  Filled 2018-02-03 (×2): qty 2

## 2018-02-03 MED ORDER — ASPIRIN 81 MG PO CHEW
324.0000 mg | CHEWABLE_TABLET | Freq: Once | ORAL | Status: AC
  Administered 2018-02-03: 324 mg via ORAL

## 2018-02-03 MED ORDER — DOCUSATE SODIUM 100 MG PO CAPS
100.0000 mg | ORAL_CAPSULE | Freq: Two times a day (BID) | ORAL | Status: DC
  Filled 2018-02-03: qty 1

## 2018-02-03 MED ORDER — METOPROLOL SUCCINATE ER 100 MG PO TB24
100.0000 mg | ORAL_TABLET | ORAL | Status: DC
  Administered 2018-02-04: 100 mg via ORAL
  Filled 2018-02-03: qty 1

## 2018-02-03 MED ORDER — ASPIRIN 81 MG PO CHEW
CHEWABLE_TABLET | ORAL | Status: AC
  Filled 2018-02-03: qty 4

## 2018-02-03 MED ORDER — BISACODYL 5 MG PO TBEC: 5.0000 mg | DELAYED_RELEASE_TABLET | Freq: Every day | ORAL | Status: DC | PRN

## 2018-02-03 MED ORDER — POTASSIUM CHLORIDE CRYS ER 20 MEQ PO TBCR
40.0000 meq | EXTENDED_RELEASE_TABLET | Freq: Once | ORAL | Status: AC
  Administered 2018-02-03: 40 meq via ORAL
  Filled 2018-02-03: qty 2

## 2018-02-03 MED ORDER — POTASSIUM CHLORIDE 10 MEQ/50ML IV SOLN: 10.0000 meq | INTRAVENOUS | Status: DC | PRN

## 2018-02-03 MED ORDER — ONDANSETRON HCL 4 MG/2ML IJ SOLN: 4.0000 mg | Freq: Four times a day (QID) | INTRAMUSCULAR | Status: DC | PRN

## 2018-02-03 MED ORDER — HYDRALAZINE HCL 20 MG/ML IJ SOLN
10.0000 mg | Freq: Four times a day (QID) | INTRAMUSCULAR | Status: DC | PRN
  Administered 2018-02-03 – 2018-02-04 (×3): 10 mg via INTRAVENOUS
  Filled 2018-02-03 (×3): qty 1

## 2018-02-03 MED ORDER — PANTOPRAZOLE SODIUM 40 MG PO TBEC
40.0000 mg | DELAYED_RELEASE_TABLET | Freq: Every day | ORAL | Status: DC
  Administered 2018-02-03 – 2018-02-04 (×2): 40 mg via ORAL
  Filled 2018-02-03 (×2): qty 1

## 2018-02-03 MED ORDER — ACETAMINOPHEN 325 MG PO TABS: 650.0000 mg | ORAL_TABLET | Freq: Four times a day (QID) | ORAL | Status: DC | PRN

## 2018-02-03 MED ORDER — CLONIDINE HCL 0.1 MG PO TABS
0.1000 mg | ORAL_TABLET | Freq: Four times a day (QID) | ORAL | Status: DC | PRN
  Administered 2018-02-03: 0.1 mg via ORAL
  Filled 2018-02-03: qty 1

## 2018-02-03 MED ORDER — LOSARTAN POTASSIUM-HCTZ 50-12.5 MG PO TABS: 1.0000 | ORAL_TABLET | ORAL | Status: DC

## 2018-02-03 MED ORDER — HYDROCODONE-ACETAMINOPHEN 5-325 MG PO TABS: 1.0000 | ORAL_TABLET | ORAL | Status: DC | PRN

## 2018-02-03 MED ORDER — IOHEXOL 350 MG/ML SOLN
75.0000 mL | Freq: Once | INTRAVENOUS | Status: AC | PRN
  Administered 2018-02-03: 75 mL via INTRAVENOUS

## 2018-02-03 MED ORDER — LOSARTAN POTASSIUM 50 MG PO TABS
50.0000 mg | ORAL_TABLET | Freq: Every day | ORAL | Status: DC
  Filled 2018-02-03: qty 1

## 2018-02-03 MED ORDER — METOPROLOL SUCCINATE ER 50 MG PO TB24
50.0000 mg | ORAL_TABLET | ORAL | Status: DC
  Administered 2018-02-03: 50 mg via ORAL
  Filled 2018-02-03: qty 1

## 2018-02-03 MED ORDER — ONDANSETRON HCL 4 MG PO TABS: 4.0000 mg | ORAL_TABLET | Freq: Four times a day (QID) | ORAL | Status: DC | PRN

## 2018-02-03 MED ORDER — HEPARIN SODIUM (PORCINE) 5000 UNIT/ML IJ SOLN
5000.0000 [IU] | Freq: Three times a day (TID) | INTRAMUSCULAR | Status: DC
  Administered 2018-02-03 – 2018-02-04 (×3): 5000 [IU] via SUBCUTANEOUS
  Filled 2018-02-03 (×3): qty 1

## 2018-02-03 NOTE — Progress Notes (Signed)
*  PRELIMINARY RESULTS* Echocardiogram 2D Echocardiogram has been performed.  Cristela BlueHege, Shylynn Bruning 02/03/2018, 1:54 PM

## 2018-02-03 NOTE — H&P (Signed)
[facility]   PATIENT NAME: Ronald PatchJohn Pratt    MR#:  [id number]  DATE OF BIRTH:  12/29/1975  DATE OF ADMISSION:  02/02/2018  PRIMARY CARE PHYSICIAN: Barbette ReichmannHande, Vishwanath, MD   REQUESTING/REFERRING PHYSICIAN:   CHIEF COMPLAINT:   Chief Complaint  Patient presents with  . Chest Pain    HISTORY OF PRESENT ILLNESS: Ronald Pratt  is a 42 y.o. male with a known history of hypertension, gout, diverticulosis seizures. Patient presented to emergency room for intermittent, sharp, 5/10 left-sided chest pain, with radiation to the back, going on for the past 3 days, gradually getting worse.  His symptoms are worse with exertion.  She also complains of shortness of breath and bilateral lower extremity swelling, going on for the past 3 days, gradually getting worse.  His dyspnea is worse with laying down flat and improves when sitting up.  No fever/chills, no cough. At the arrival to emergency room, blood pressure was elevated at 190/118.  Patient admits to not being compliant with his medications.  Blood test done emergency room are notable for low potassium level at 3.3.  Troponin level is within normal limits. EKG, reviewed by myself, shows normal sinus rhythm with left ventricular hypertrophy.  Normal axis normal interval.  No ST-T changes.  Chest x-ray, reviewed by myself, shows no active cardiopulmonary disease. Patient is admitted for further evaluation and treatment.  PAST MEDICAL HISTORY:   Past Medical History:  Diagnosis Date  . Diverticulosis large intestine w/o perforation or abscess w/o bleeding 12/11/2016  . Dysrhythmia    tacchycardia new..  put on metoprolol by dr. Juliann Parescallwood for surgery  . GERD (gastroesophageal reflux disease)   . Gout   . Hypertension   . 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  [name]. Umbilical hernia     PAST SURGICAL HISTORY:  Past Surgical History:  Procedure Laterality Date  .  ESOPHAGOGASTRODUODENOSCOPY ENDOSCOPY  2014  . INSERTION OF MESH N/A 01/04/2017   Procedure: INSERTION OF MESH;  Surgeon: Henrene DodgeJose Piscoya, MD;  Location: [facility] ORS;  Service: General;  Laterality: N/A;  . UMBILICAL HERNIA REPAIR N/A 01/04/2017   Procedure: HERNIA REPAIR UMBILICAL ADULT with mesh;  Surgeon: Henrene DodgeJose Piscoya, MD;  Location: [facility] ORS;  Service: General;  Laterality: N/A;    SOCIAL HISTORY:  Social History   Tobacco Use  . Smoking status: Current Every Day Smoker    Packs/day: 1.00    Years: 20.00    Pack years: 20.00    Types: Cigarettes  . Smokeless tobacco: Never Used  Substance Use Topics  . Alcohol use: No    Comment: SOBER SINCE 2010    FAMILY HISTORY:  Family History  Problem Relation Age of Onset  . Breast cancer Mother   . Heart disease Mother   . Prostate cancer Father   . Colon cancer Father   . Diabetes Sister   . Heart disease Sister     DRUG ALLERGIES: No Known Allergies  REVIEW OF SYSTEMS:   CONSTITUTIONAL: No fever, fatigue or weakness.  EYES: No blurred or double vision.  EARS, NOSE, AND THROAT: No tinnitus or ear pain.  RESPIRATORY: No cough, wheezing or hemoptysis.  CARDIOVASCULAR: Positive for chest pain, orthopnea, edema.  GASTROINTESTINAL: No nausea, vomiting, diarrhea or abdominal pain.  GENITOURINARY: No dysuria, hematuria.  ENDOCRINE: No polyuria, nocturia,  HEMATOLOGY: No bleeding SKIN: No rash or lesion. MUSCULOSKELETAL: No joint pain or arthritis.  NEUROLOGIC: No tingling, numbness, weakness.  PSYCHIATRY: No anxiety or depression.   MEDICATIONS AT HOME:  Prior to Admission medications   Medication Sig Start Date End Date Taking? Authorizing Provider  losartan-hydrochlorothiazide (HYZAAR) 50-12.5 MG tablet Take 1 tablet by mouth 1 day or 1 dose. 12/29/16 02/03/18 Yes [provider]  docusate sodium (COLACE) 100 MG capsule Take 2 capsules (200 mg total) by mouth 2 (two) times daily. Patient not taking: Reported on 02/03/2018  12/11/16   Sharman Cheek, MD  ibuprofen (ADVIL,MOTRIN) 600 MG tablet Take 1 tablet (600 mg total) by mouth every 8 (eight) hours as needed for fever or mild pain. Patient not taking: Reported on 02/03/2018 01/04/17   Henrene Dodge, MD  meloxicam (MOBIC) 15 MG tablet Take 1 tablet (15 mg total) by mouth daily. Patient not taking: Reported on 02/03/2018 11/26/15   Cuthriell, Delorise Royals, PA-C  metoprolol succinate (TOPROL-XL) 50 MG 24 hr tablet Take 50 mg by mouth every morning. Take with or immediately following a meal.    [provider]  naproxen (NAPROSYN) 500 MG tablet Take 1 tablet (500 mg total) by mouth 2 (two) times daily with a meal. Patient not taking: Reported on 02/03/2018 12/11/16   Sharman Cheek, MD  omeprazole (PRILOSEC) 20 MG capsule Take 1 capsule by mouth 1 day or 1 dose. 12/29/16 12/29/17  [provider]  ranitidine (ZANTAC) 150 MG tablet Take 150 mg by mouth as needed for heartburn.    [provider]      PHYSICAL EXAMINATION:   VITAL SIGNS: Blood pressure (!) 161/89, pulse 92, temperature 98.7 F (37.1 C), temperature source Oral, resp. rate 19, height 6\' 1"  (1.854 m), weight 111.6 kg (246 lb), SpO2 100 %.  GENERAL:  42 y.o.-year-old patient lying in the bed, in mild respiratory distress.  EYES: Pupils equal, round, reactive to light and accommodation. No scleral icterus. Extraocular muscles intact.  HEENT: Head atraumatic, normocephalic. Oropharynx and nasopharynx clear.  NECK:  Supple, no jugular venous distention. No thyroid enlargement, no tenderness.  LUNGS: Normal breath sounds bilaterally, no wheezing, rales,rhonchi or crepitation. No use of accessory muscles of respiration.  CARDIOVASCULAR: S1, S2 normal. No S3/S4.  ABDOMEN: Soft, nontender, nondistended. Bowel sounds present. No organomegaly or mass.  EXTREMITIES: Positive for 2+ bilateral pitting pedal edema, up to the knees.  NEUROLOGIC: No focal weakness.  PSYCHIATRIC: The patient is  alert and oriented x 3.  SKIN: No obvious rash, lesion, or ulcer.   LABORATORY PANEL:   CBC Recent Labs  Lab 02/02/18 1957  WBC 7.4  HGB 13.5  HCT 38.6*  PLT 196  MCV 106.7*  MCH 37.4*  MCHC 35.0  RDW 17.2*   ------------------------------------------------------------------------------------------------------------------  Chemistries  Recent Labs  Lab 02/02/18 1957  NA 139  K 3.3*  CL 108  CO2 25  GLUCOSE 95  BUN 14  CREATININE 0.99  CALCIUM 9.0   ------------------------------------------------------------------------------------------------------------------ estimated creatinine clearance is 128.6 mL/min (by C-G formula based on SCr of 0.99 mg/dL). ------------------------------------------------------------------------------------------------------------------ No results for input(s): TSH, T4TOTAL, T3FREE, THYROIDAB in the last 72 hours.  Invalid input(s): FREET3   Coagulation profile No results for input(s): INR, PROTIME in the last 168 hours. ------------------------------------------------------------------------------------------------------------------- No results for input(s): DDIMER in the last 72 hours. -------------------------------------------------------------------------------------------------------------------  Cardiac Enzymes Recent Labs  Lab 02/02/18 1957 02/03/18 0047  TROPONINI <0.03 <0.03   ------------------------------------------------------------------------------------------------------------------ Invalid input(s): POCBNP  ---------------------------------------------------------------------------------------------------------------  Urinalysis    Component Value Date/Time   COLORURINE YELLOW (A) 12/11/2016 1610   APPEARANCEUR  CLEAR (A) 12/11/2016 0633   LABSPEC 1.015 12/11/2016 0633   PHURINE 6.0 12/11/2016 0633   GLUCOSEU NEGATIVE 12/11/2016 0633   HGBUR NEGATIVE 12/11/2016 0633   BILIRUBINUR NEGATIVE 12/11/2016 0633    KETONESUR NEGATIVE 12/11/2016 0633   PROTEINUR NEGATIVE 12/11/2016 0633   NITRITE NEGATIVE 12/11/2016 0633   LEUKOCYTESUR NEGATIVE 12/11/2016 0981     RADIOLOGY: Dg Chest 2 View  Result Date: 02/02/2018 CLINICAL DATA:  Chest pain EXAM: CHEST - 2 VIEW COMPARISON:  01/16/2010 FINDINGS: The heart size and mediastinal contours are within normal limits. Both lungs are clear. The visualized skeletal structures are unremarkable. IMPRESSION: No active cardiopulmonary disease. Electronically Signed   By: Jasmine Pang M.D.   On: 02/02/2018 20:15    EKG: Orders placed or performed during the hospital encounter of 02/02/18  . ED EKG within 10 minutes  . ED EKG within 10 minutes    IMPRESSION AND PLAN:  1.  New onset acute CHF.  We will start IV Lasix and check 2D echo to further characterize the type of CHF.  Continue treatment with beta-blocker and ARB.  Cardiology is consulted for further evaluation and treatment.  2.  Hypertensive emergency, initial blood pressure was 190/118.  Patient admits to being noncompliant with his medications.  Will restart home medications and add Lasix.  Continue to monitor blood pressure closely. 3.  Hypokalemia, will replace potassium per protocol. 4.  Tobacco abuse.  Smoking cessation was discussed with patient in detail.  He is not ready to quit smoking at this time.  All the records are reviewed and case discussed with ED provider. Management plans discussed with the patient, family and they are in agreement.  CODE STATUS: FULL    TOTAL TIME TAKING CARE OF THIS PATIENT: 45 minutes.    Cammy Copa M.D on 02/03/2018 at 3:37 AM  Between 7am to 6pm - Pager - [phone]  After 6pm go to [email or web address] - password [email or web address] [facility]  [place] [facility]  Office  [phone]  CC: Primary care physician; Barbette Reichmann, MD

## 2018-02-03 NOTE — ED Notes (Signed)
Pt given breakfast tray

## 2018-02-03 NOTE — Progress Notes (Signed)
[facility] - [place] at [facility]                                                                                                                                                                                  Patient Demographics   Ronald Pratt, is a 42 y.o. male, DOB - Jul 03, 1976, ZOX:[id number]  Admit date - 02/02/2018   Admitting Physician Cammy Copa, MD  Outpatient Primary MD for the patient is Barbette Reichmann, MD   LOS - 0  Subjective: Patient states that his breathing is improved    Review of Systems:   CONSTITUTIONAL: No documented fever. No fatigue, weakness. No weight gain, no weight loss.  EYES: No blurry or double vision.  ENT: No tinnitus. No postnasal drip. No redness of the oropharynx.  RESPIRATORY: No cough, no wheeze, no hemoptysis.  Positive dyspnea.  CARDIOVASCULAR: No chest pain. No orthopnea. No palpitations. No syncope.  Positive lower extremity edema GASTROINTESTINAL: No nausea, no vomiting or diarrhea. No abdominal pain. No melena or hematochezia.  GENITOURINARY: No dysuria or hematuria.  ENDOCRINE: No polyuria or nocturia. No heat or cold intolerance.  HEMATOLOGY: No anemia. No bruising. No bleeding.  INTEGUMENTARY: No rashes. No lesions.  MUSCULOSKELETAL: No arthritis. No swelling. No gout.  NEUROLOGIC: No numbness, tingling, or ataxia. No seizure-type activity.  PSYCHIATRIC: No anxiety. No insomnia. No ADD.    Vitals:   Vitals:   02/03/18 1000 02/03/18 1114 02/03/18 1242 02/03/18 1431  BP: (!) 150/118 (!) 160/125 (!) 164/121 (!) 156/109  Pulse:  81 78 83  Resp:  20  18  Temp:  98.6 F (37 C)  98.2 F (36.8 C)  TempSrc:  Oral  Oral  SpO2:  97% 100% 100%  Weight:  115.4 kg (254 lb 8 oz)    Height:  6\' 1"  (1.854 m)      Wt Readings from Last 3 Encounters:  02/03/18 115.4 kg (254 lb 8 oz)  01/25/17 110 kg (242 lb 6.4 oz)  01/04/17 110.2 kg (243 lb)     Intake/Output Summary (Last 24 hours) at 02/03/2018 1451 Last data  filed at 02/03/2018 1016 Gross per 24 hour  Intake 300 ml  Output 3100 ml  Net -2800 ml    Physical Exam:   GENERAL: Pleasant-appearing in no apparent distress.  HEAD, EYES, EARS, NOSE AND THROAT: Atraumatic, normocephalic. Extraocular muscles are intact. Pupils equal and reactive to light. Sclerae anicteric. No conjunctival injection. No oro-pharyngeal erythema.  NECK: Supple. There is no jugular venous distention. No bruits, no lymphadenopathy, no thyromegaly.  HEART: Regular rate and rhythm,. No murmurs, no rubs, no clicks.  LUNGS: Crackles at the bases no accessory muscle  usage ABDOMEN: Soft, flat, nontender, nondistended. Has good bowel sounds. No hepatosplenomegaly appreciated.  EXTREMITIES: No evidence of any cyanosis, clubbing, or 2 +peripheral edema.  Positive radial pulses bilaterally.  NEUROLOGIC: The patient is alert, awake, and oriented x3 with no focal motor or sensory deficits appreciated bilaterally.  SKIN: Moist and warm with no rashes appreciated.  Psych: Not anxious, depressed LN: No inguinal LN enlargement    Antibiotics   Anti-infectives (From admission, onward)   None      Medications   Scheduled Meds: . docusate sodium  100 mg Oral BID  . famotidine  10 mg Oral Daily  . furosemide  40 mg Intravenous BID  . heparin  5,000 Units Subcutaneous Q8H  . losartan  50 mg Oral Daily   Or  . hydrochlorothiazide  12.5 mg Oral Daily  . metoprolol succinate  50 mg Oral [id number]  . pantoprazole  40 mg Oral Daily   Continuous Infusions: PRN Meds:.acetaminophen **OR** acetaminophen, bisacodyl, cloNIDine, hydrALAZINE, HYDROcodone-acetaminophen, ondansetron **OR** ondansetron (ZOFRAN) IV   Data Review:   Micro Results No results found for this or any previous visit (from the past 240 hour(s)).  Radiology Reports Dg Chest 2 View  Result Date: 02/02/2018 CLINICAL DATA:  Chest pain EXAM: CHEST - 2 VIEW COMPARISON:  01/16/2010 FINDINGS: The heart size and  mediastinal contours are within normal limits. Both lungs are clear. The visualized skeletal structures are unremarkable. IMPRESSION: No active cardiopulmonary disease. Electronically Signed   By: Jasmine PangKim  Fujinaga M.D.   On: 02/02/2018 20:15   Ct Angio Chest Pe W And/or Wo Contrast  Result Date: 02/03/2018 CLINICAL DATA:  Left-sided chest pain for several days EXAM: CT ANGIOGRAPHY CHEST WITH CONTRAST TECHNIQUE: Multidetector CT imaging of the chest was performed using the standard protocol during bolus administration of intravenous contrast. Multiplanar CT image reconstructions and MIPs were obtained to evaluate the vascular anatomy. CONTRAST:  75mL OMNIPAQUE IOHEXOL 350 MG/ML SOLN COMPARISON:  Chest x-ray from the previous day FINDINGS: Cardiovascular: Thoracic aorta is within normal limits without aneurysmal dilatation or dissection. No significant cardiac enlargement is noted. No coronary calcifications are seen. The pulmonary artery shows a normal branching pattern. No filling defect to suggest pulmonary embolism is identified. Mediastinum/Nodes: The thoracic inlet is unremarkable. No significant hilar or mediastinal adenopathy is noted. The esophagus is within normal limits. Lungs/Pleura: Lungs are clear. No pleural effusion or pneumothorax. Upper Abdomen: Visualized upper abdomen is unremarkable. Musculoskeletal: No chest wall abnormality. No acute or significant osseous findings. Review of the MIP images confirms the above findings. IMPRESSION: No evidence of pulmonary emboli. No acute abnormality seen. Electronically Signed   By: Alcide CleverMark  Lukens M.D.   On: 02/03/2018 08:01     CBC Recent Labs  Lab 02/02/18 1957 02/03/18 1213  WBC 7.4 7.0  HGB 13.5 14.2  HCT 38.6* 41.3  PLT 196 212  MCV 106.7* 106.5*  MCH 37.4* 36.5*  MCHC 35.0 34.3  RDW 17.2* 17.3*    Chemistries  Recent Labs  Lab 02/02/18 1957 02/03/18 1213  NA 139 137  K 3.3* 3.8  CL 108 102  CO2 25 28  GLUCOSE 95 106*  BUN 14 14   CREATININE 0.99 1.07  CALCIUM 9.0 9.1   ------------------------------------------------------------------------------------------------------------------ estimated creatinine clearance is 120.9 mL/min (by C-G formula based on SCr of 1.07 mg/dL). ------------------------------------------------------------------------------------------------------------------ No results for input(s): HGBA1C in the last 72 hours. ------------------------------------------------------------------------------------------------------------------ No results for input(s): CHOL, HDL, LDLCALC, TRIG, CHOLHDL, LDLDIRECT in the last 72 hours. ------------------------------------------------------------------------------------------------------------------ No results  for input(s): TSH, T4TOTAL, T3FREE, THYROIDAB in the last 72 hours.  Invalid input(s): FREET3 ------------------------------------------------------------------------------------------------------------------ No results for input(s): VITAMINB12, FOLATE, FERRITIN, TIBC, IRON, RETICCTPCT in the last 72 hours.  Coagulation profile No results for input(s): INR, PROTIME in the last 168 hours.  No results for input(s): DDIMER in the last 72 hours.  Cardiac Enzymes Recent Labs  Lab 02/02/18 1957 02/03/18 0047  TROPONINI <0.03 <0.03   ------------------------------------------------------------------------------------------------------------------ Invalid input(s): POCBNP    Assessment & Plan  Patient is 42 year old with hypertension   1.  New onset acute CHF.    Continue IV Lasix   Continue treatment with beta-blocker and ARB.  Echo of the heart pending, cardiology has not been consulted 2.  Hypertensive emergency, initial blood pressure was 190/118.  Patient admits to being noncompliant with his medications.    Increase metoprolol dose Continue Catapres as needed Hydralazine as needed 3.  Hypokalemia, will replace potassium per protocol. 4.   Tobacco abuse.    Smoking cessation provided 4 minutes spent I strongly recommend that she stop smoking nicotine patch was offered to the patient        Code Status Orders  (From admission, onward)        Start     Ordered   02/03/18 0515  Full code  Continuous     02/03/18 0514    Code Status History    This patient has a current code status but no historical code status.           Consults cardiology  DVT Prophylaxis  Lovenox   Lab Results  Component Value Date   PLT 212 02/03/2018     Time Spent in minutes45 minutes greater than 50% of time spent in care coordination and counseling patient regarding the condition and plan of care.   Auburn Bilberry M.D on 02/03/2018 at 2:51 PM  Between 7am to 6pm - Pager - [phone]  After 6pm go to [email or web address] - Social research officer, government  [facility]   Office  [phone]

## 2018-02-03 NOTE — ED Notes (Signed)
[phone] Toniann FailWendy family

## 2018-02-03 NOTE — Progress Notes (Signed)
md patel was notified of bp 156/109 after iv hydrazine. No further orders at this time.

## 2018-02-03 NOTE — ED Notes (Signed)
Attempt to release admit order x 3 (0345, 0400 0445). States Dr Caryn BeeMaier in chart. Called and she states she is not in chart but will log out of all and restart her computer. Will carry our admitting order for this am as needed

## 2018-02-04 LAB — BASIC METABOLIC PANEL
Anion gap: 7 (ref 5–15)
BUN: 17 mg/dL (ref 6–20)
CALCIUM: 9.2 mg/dL (ref 8.9–10.3)
CHLORIDE: 104 mmol/L (ref 101–111)
CO2: 27 mmol/L (ref 22–32)
CREATININE: 0.97 mg/dL (ref 0.61–1.24)
GFR calc Af Amer: >60 mL/min (ref >60)
GFR calc non Af Amer: >60 mL/min (ref >60)
GLUCOSE: 120 mg/dL — AB (ref 65–99)
Potassium: 3.4 mmol/L — ABNORMAL LOW (ref 3.5–5.1)
Sodium: 138 mmol/L (ref 135–145)

## 2018-02-04 LAB — HIV ANTIBODY (ROUTINE TESTING W REFLEX): HIV SCREEN 4TH GENERATION: NONREACTIVE

## 2018-02-04 LAB — GLUCOSE, CAPILLARY: Glucose-Capillary: 142 mg/dL — ABNORMAL HIGH (ref 65–99)

## 2018-02-04 MED ORDER — METOPROLOL SUCCINATE ER 100 MG PO TB24: 100.0000 mg | ORAL_TABLET | ORAL | 0 refills | Status: DC

## 2018-02-04 MED ORDER — LOSARTAN POTASSIUM 100 MG PO TABS: 100.0000 mg | ORAL_TABLET | Freq: Every day | ORAL | 0 refills | Status: DC

## 2018-02-04 MED ORDER — POTASSIUM CHLORIDE CRYS ER 20 MEQ PO TBCR
40.0000 meq | EXTENDED_RELEASE_TABLET | Freq: Once | ORAL | Status: AC
  Administered 2018-02-04: 40 meq via ORAL
  Filled 2018-02-04: qty 2

## 2018-02-04 MED ORDER — FUROSEMIDE 40 MG PO TABS: 40.0000 mg | ORAL_TABLET | Freq: Every day | ORAL | 0 refills | Status: DC

## 2018-02-04 NOTE — Progress Notes (Signed)
Pt to be discharged today. Iv and tele removed. disch instructions and prescrips given to pt. disch via w.c. Accompanied by sister

## 2018-02-04 NOTE — Progress Notes (Signed)
Patient ID: Ronald GallusJohn E Santilli [facility] - [place] at [facility]        [name] was admitted to the Hospital on 02/02/2018 and Discharged  02/04/2018 and should be excused from work/school   for 3  days starting 02/02/2018 , may return to work/school without any restrictions.  Alford Highlandichard Gaelen Brager M.D on 02/04/2018,at 9:18 AM  [facility] -  at [facility]    Office  [phone]

## 2018-02-04 NOTE — Care Management (Signed)
RNCM spoke with patient regarding new diagnosis of CHF.  He has his car here now along with his mother. He states his sister will call Dr. Marcello FennelHande Monday for follow up appointment.  He has a way to weigh himself. He has health insurance through his employer. He is not requiring supplemental O2 at this time.  He denies RNCM needs.

## 2018-02-04 NOTE — Discharge Summary (Signed)
[employer] - [place] at [facility]   PATIENT NAME: Ronald Pratt    MR#:  [id number]  DATE OF BIRTH:  12/27/1975  DATE OF ADMISSION:  02/02/2018 ADMITTING PHYSICIAN: Cammy Copa, MD  DATE OF DISCHARGE: 02/04/2018 11:20 AM  PRIMARY CARE PHYSICIAN: Barbette Reichmann, MD    ADMISSION DIAGNOSIS:  Peripheral edema [R60.9] Chest pain, unspecified type [R07.9]  DISCHARGE DIAGNOSIS:  Active Problems:   CHF (congestive heart failure) (HCC)   SECONDARY DIAGNOSIS:   Past Medical History:  Diagnosis Date  . CHF (congestive heart failure) (HCC)   . Diverticulosis large intestine w/o perforation or abscess w/o bleeding 12/11/2016  . Dysrhythmia    tacchycardia new..  put on metoprolol by dr. Juliann Pares for surgery  . GERD (gastroesophageal reflux disease)   . Gout   . Hypertension   . 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  [name] Umbilical hernia     HOSPITAL COURSE:   1.  Acute on chronic diastolic congestive heart failure.  The patient was diuresed with IV Lasix while here.  Switch over to oral Lasix upon discharge home.  Low-salt diet discussed at length.  Controlling blood pressure discussed.  Follow-up at CHF clinic as outpatient.   patient placed on losartan and metoprolol. 2.  Hypertensive emergency.  Initial blood pressure was very high.  Patient has not been taking his medications at home.  Patient on metoprolol and losartan and Lasix.  Blood pressure upon discharge 133/80. 3.  Hypokalemia.  With switching over the patient to oral Lasix and placing the patient on losartan hopefully the patient will need not have potassium replacement as outpatient.  Check a BMP and follow-up appointment.  Potassium replaced in the hospital. 4.  Tobacco abuse patient was counseled on smoking cessation by my associate 5.  GERD on ranitidine  DISCHARGE CONDITIONS:   Satisfactory  CONSULTS OBTAINED:  Treatment Team:  Alwyn Pea,  MD  DRUG ALLERGIES:  No Known Allergies  DISCHARGE MEDICATIONS:   Allergies as of 02/04/2018   No Known Allergies     Medication List    STOP taking these medications   docusate sodium 100 MG capsule Commonly known as:  COLACE   ibuprofen 600 MG tablet Commonly known as:  ADVIL,MOTRIN   losartan-hydrochlorothiazide 50-12.5 MG tablet Commonly known as:  HYZAAR   meloxicam 15 MG tablet Commonly known as:  MOBIC   naproxen 500 MG tablet Commonly known as:  NAPROSYN   omeprazole 20 MG capsule Commonly known as:  PRILOSEC     TAKE these medications   furosemide 40 MG tablet Commonly known as:  LASIX Take 1 tablet (40 mg total) by mouth daily.   losartan 100 MG tablet Commonly known as:  COZAAR Take 1 tablet (100 mg total) by mouth daily.   metoprolol succinate 100 MG 24 hr tablet Commonly known as:  TOPROL-XL Take 1 tablet (100 mg total) by mouth every morning. Take with or immediately following a meal. Start taking on:  02/05/2018 What changed:    medication strength  how much to take   ranitidine 150 MG tablet Commonly known as:  ZANTAC Take 150 mg by mouth as needed for heartburn.        DISCHARGE INSTRUCTIONS:   Follow-up PMD 6 days Follow-up cardiology  1-2 weeks Follow-up with CHF clinic  If you experience worsening of your admission symptoms, develop shortness of breath, life threatening emergency, suicidal or homicidal thoughts you must  seek medical attention immediately by calling 911 or calling your MD immediately  if symptoms less severe.  You Must read complete instructions/literature along with all the possible adverse reactions/side effects for all the Medicines you take and that have been prescribed to you. Take any new Medicines after you have completely understood and accept all the possible adverse reactions/side effects.   Please note  You were cared for by a hospitalist during your hospital stay. If you have any questions about your  discharge medications or the care you received while you were in the hospital after you are discharged, you can call the unit and asked to speak with the hospitalist on call if the hospitalist that took care of you is not available. Once you are discharged, your primary care physician will handle any further medical issues. Please note that NO REFILLS for any discharge medications will be authorized once you are discharged, as it is imperative that you return to your primary care physician (or establish a relationship with a primary care physician if you do not have one) for your aftercare needs so that they can reassess your need for medications and monitor your lab values.    Today   CHIEF COMPLAINT:   Chief Complaint  Patient presents with  . Chest Pain    HISTORY OF PRESENT ILLNESS:  Ronald Pratt  is a 42 y.o. male presented with edema and shortness of breath   VITAL SIGNS:  Blood pressure 133/80, pulse 98, temperature 98.1 F (36.7 C), temperature source Oral, resp. rate 18, height 6\' 1"  (1.854 m), weight 112.8 kg (248 lb 9.6 oz), SpO2 97 %.    PHYSICAL EXAMINATION:  GENERAL:  42 y.o.-year-old patient lying in the bed with no acute distress.  EYES: Pupils equal, round, reactive to light and accommodation. No scleral icterus. Extraocular muscles intact.  HEENT: Head atraumatic, normocephalic. Oropharynx and nasopharynx clear.  NECK:  Supple, no jugular venous distention. No thyroid enlargement, no tenderness.  LUNGS: Normal breath sounds bilaterally, no wheezing, rales,rhonchi or crepitation. No use of accessory muscles of respiration.  CARDIOVASCULAR: S1, S2 normal. No murmurs, rubs, or gallops.  ABDOMEN: Soft, non-tender, non-distended. Bowel sounds present. No organomegaly or mass.  EXTREMITIES: No pedal edema, cyanosis, or clubbing.  NEUROLOGIC: Cranial nerves II through XII are intact. Muscle strength 5/5 in all extremities. Sensation intact. Gait not checked.  PSYCHIATRIC:  The patient is alert and oriented x 3.  SKIN: No obvious rash, lesion, or ulcer.   DATA REVIEW:   CBC Recent Labs  Lab 02/03/18 1213  WBC 7.0  HGB 14.2  HCT 41.3  PLT 212    Chemistries  Recent Labs  Lab 02/04/18 0500  NA 138  K 3.4*  CL 104  CO2 27  GLUCOSE 120*  BUN 17  CREATININE 0.97  CALCIUM 9.2    Cardiac Enzymes Recent Labs  Lab 02/03/18 0047  TROPONINI <0.03     RADIOLOGY:  Dg Chest 2 View  Result Date: 02/02/2018 CLINICAL DATA:  Chest pain EXAM: CHEST - 2 VIEW COMPARISON:  01/16/2010 FINDINGS: The heart size and mediastinal contours are within normal limits. Both lungs are clear. The visualized skeletal structures are unremarkable. IMPRESSION: No active cardiopulmonary disease. Electronically Signed   By: Jasmine PangKim  Fujinaga M.D.   On: 02/02/2018 20:15   Ct Angio Chest Pe W And/or Wo Contrast  Result Date: 02/03/2018 CLINICAL DATA:  Left-sided chest pain for several days EXAM: CT ANGIOGRAPHY CHEST WITH CONTRAST TECHNIQUE: Multidetector CT imaging of  the chest was performed using the standard protocol during bolus administration of intravenous contrast. Multiplanar CT image reconstructions and MIPs were obtained to evaluate the vascular anatomy. CONTRAST:  75mL OMNIPAQUE IOHEXOL 350 MG/ML SOLN COMPARISON:  Chest x-ray from the previous day FINDINGS: Cardiovascular: Thoracic aorta is within normal limits without aneurysmal dilatation or dissection. No significant cardiac enlargement is noted. No coronary calcifications are seen. The pulmonary artery shows a normal branching pattern. No filling defect to suggest pulmonary embolism is identified. Mediastinum/Nodes: The thoracic inlet is unremarkable. No significant hilar or mediastinal adenopathy is noted. The esophagus is within normal limits. Lungs/Pleura: Lungs are clear. No pleural effusion or pneumothorax. Upper Abdomen: Visualized upper abdomen is unremarkable. Musculoskeletal: No chest wall abnormality. No acute or  significant osseous findings. Review of the MIP images confirms the above findings. IMPRESSION: No evidence of pulmonary emboli. No acute abnormality seen. Electronically Signed   By: Alcide Clever M.D.   On: 02/03/2018 08:01     Management plans discussed with the patient, family and they are in agreement.  CODE STATUS:     Code Status Orders  (From admission, onward)        Start     Ordered   02/03/18 0515  Full code  Continuous     02/03/18 0514    Code Status History    This patient has a current code status but no historical code status.      TOTAL TIME TAKING CARE OF THIS PATIENT: 35 minutes.    Alford Highland M.D on 02/04/2018 at 12:39 PM  Between 7am to 6pm - Pager - [phone]  After 6pm go to [email or web address] - password [email or web address]  [employer] Office  [phone]  CC: Primary care physician; Barbette Reichmann, MD

## 2018-02-20 NOTE — Progress Notes (Deleted)
   Patient ID: Ronald Pratt, male    DOB: [birth year], 42 y.o.   MRN: [id number]  HPI  Ronald Pratt is a 42 y/o male with a history of  Echo report from 02/03/18 reviewed and showed an EF of 55-60% along with mild Ronald with normal PA pressure.   Admitted 02/02/18 due to acute on chronic heart failure. Initially needed IV lasix and then transitioned to oral diuretics. HTN medications resumed and he was discharged in 2 days. Was in the ED 12/12/17 due to the flu where he was treated and released.  He presents today for his initial visit with a chief complaint of   Review of Systems    Physical Exam    Assessment & Plan:  1: Chronic heart failure with preserved ejection fraction- - NYHA class - saw cardiology Juliann Pares) 12/30/16 - BNP 02/02/18 was 68.0  2: HTN- - saw PCP (Hande) 04/28/17 - BMP from 02/04/18 reviewed and showed sodium 138, potassium 3.4 and GFR >60  3: Tobacco use-

## 2018-02-21 ENCOUNTER — Telehealth: Payer: Self-pay | Admitting: Family

## 2018-02-21 ENCOUNTER — Ambulatory Visit: Payer: PRIVATE HEALTH INSURANCE | Admitting: Family

## 2018-02-21 NOTE — Telephone Encounter (Signed)
Patient did not show for his Heart Failure Clinic appointment on 02/21/18. Attempted to contact patient with reminder phone call prior to today's appointment but his voice mailbox was full.  Will attempt to reschedule.

## 2018-02-22 ENCOUNTER — Ambulatory Visit: Payer: PRIVATE HEALTH INSURANCE | Attending: Neurology

## 2018-02-22 DIAGNOSIS — G4733 Obstructive sleep apnea (adult) (pediatric): Secondary | ICD-10-CM | POA: Diagnosis not present

## 2018-02-22 DIAGNOSIS — G4710 Hypersomnia, unspecified: Secondary | ICD-10-CM | POA: Diagnosis present

## 2018-02-22 DIAGNOSIS — I10 Essential (primary) hypertension: Secondary | ICD-10-CM | POA: Diagnosis present

## 2018-03-15 NOTE — Progress Notes (Signed)
Patient ID: Ronald Pratt, male    DOB: Jan 31, 1976, 42 y.o.   MRN: [id number]  HPI  Ronald Pratt is a 42 y/o male with a history of HTN, gout, GERD, seizures, obstructive sleep apnea, current tobacco use and chronic heart failure.   Echo report from 02/03/18 reviewed and showed an EF of 55-60% along with mild Ronald with normal PA pressure.   Admitted 02/02/18 due to acute on chronic heart failure. Initially needed IV lasix and then transitioned to oral diuretics. HTN medications resumed and he was discharged in 2 days. Was in the ED 12/12/17 due to the flu where he was treated and released.  He presents today for his initial visit with a chief complaint of moderate fatigue upon minimal exertion. He says that this has been present for several months. He has associated shortness of breath and difficulty sleeping along with this. He denies abdominal distention, palpitations, pedal edema, chest pain, cough, dizziness or weight gain.   Past Medical History:  Diagnosis Date  . CHF (congestive heart failure) (HCC)   . Diverticulosis large intestine w/o perforation or abscess w/o bleeding 12/11/2016  . Dysrhythmia    tacchycardia new..  put on metoprolol by dr. Juliann Pares for surgery  . GERD (gastroesophageal reflux disease)   . Gout   . Hypertension   . Obstructive sleep apnea   . 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  [name] Umbilical hernia    Past Surgical History:  Procedure Laterality Date  . ESOPHAGOGASTRODUODENOSCOPY ENDOSCOPY  2014  . INSERTION OF MESH N/A 01/04/2017   Procedure: INSERTION OF MESH;  Surgeon: Henrene Dodge, MD;  Location: [facility] ORS;  Service: General;  Laterality: N/A;  . UMBILICAL HERNIA REPAIR N/A 01/04/2017   Procedure: HERNIA REPAIR UMBILICAL ADULT with mesh;  Surgeon: Henrene Dodge, MD;  Location: [facility] ORS;  Service: General;  Laterality: N/A;   Family History  Problem Relation Age of Onset  . Breast cancer Mother   . Heart disease  Mother   . Prostate cancer Father   . Colon cancer Father   . Diabetes Sister   . Heart disease Sister    Social History   Tobacco Use  . Smoking status: Current Every Day Smoker    Packs/day: 1.00    Years: 20.00    Pack years: 20.00    Types: Cigarettes  . Smokeless tobacco: Never Used  Substance Use Topics  . Alcohol use: No    Comment: SOBER SINCE 2010   No Known Allergies Prior to Admission medications   Medication Sig Start Date End Date Taking? Authorizing Provider  furosemide (LASIX) 40 MG tablet Take 1 tablet (40 mg total) by mouth daily. 02/04/18 02/04/19 Yes Wieting, Richard, MD  losartan (COZAAR) 100 MG tablet Take 1 tablet (100 mg total) by mouth daily. 02/04/18  Yes Wieting, Richard, MD  metoprolol succinate (TOPROL-XL) 100 MG 24 hr tablet Take 1 tablet (100 mg total) by mouth every morning. Take with or immediately following a meal. 02/05/18  Yes Wieting, Richard, MD   Review of Systems  Constitutional: Positive for fatigue (tire easily). Negative for appetite change.  HENT: Negative for congestion, postnasal drip and sore throat.   Eyes: Negative.   Respiratory: Positive for shortness of breath (worse with manual work). Negative for cough and chest tightness.   Cardiovascular: Negative for chest pain, palpitations and leg swelling.  Gastrointestinal: Negative for abdominal distention and abdominal pain.  Endocrine: Negative.  Genitourinary: Negative.   Musculoskeletal: Positive for arthralgias (gout pain in hands). Negative for back pain.  Skin: Negative.   Allergic/Immunologic: Negative.   Neurological: Negative for dizziness and light-headedness.  Hematological: Negative for adenopathy. Does not bruise/bleed easily.  Psychiatric/Behavioral: Positive for sleep disturbance (not sleeping well). Negative for dysphoric mood. The patient is not nervous/anxious.     Vitals:   03/16/18 1156  BP: 120/84  Pulse: 89  Resp: 18  SpO2: 99%  Weight: 252 lb 8 oz (114.5  kg)  Height:  (1.854 m)   Wt Readings from Last 3 Encounters:  03/16/18 252 lb 8 oz (114.5 kg)  02/04/18 248 lb 9.6 oz (112.8 kg)  01/25/17 242 lb 6.4 oz (110 kg)   Lab Results  Component Value Date   CREATININE 0.97 02/04/2018   CREATININE 1.07 02/03/2018   CREATININE 0.99 02/02/2018   Physical Exam  Constitutional: He is oriented to person, place, and time. He appears well-developed and well-nourished.  HENT:  Head: Normocephalic and atraumatic.  Neck: Normal range of motion. Neck supple. No JVD present.  Cardiovascular: Normal rate and regular rhythm.  Pulmonary/Chest: Effort normal. No respiratory distress. He has no wheezes. He has no rales.  Abdominal: Soft. He exhibits no distension.  Musculoskeletal:       Right lower leg: He exhibits edema (trace pitting). He exhibits no tenderness.       Left lower leg: He exhibits edema (trace pitting). He exhibits no tenderness.  Neurological: He is alert and oriented to person, place, and time.  Skin: Skin is warm and dry.  Psychiatric: He has a normal mood and affect. His behavior is normal.  Nursing note and vitals reviewed.  Assessment & Plan:  1: Chronic heart failure with preserved ejection fraction- - NYHA class III - euvolemic today - weighing daily and he was reminded to call for an overnight weight gain of >2 pounds or a weekly weight gain of >5 pounds - not adding salt to his food and has started reading food labels. Reviewed the importance of closely following a  sodium diet and written dietary information was given to him about this - instructed to keep daily fluid intake to 64 ounces daily - will get a BMP today since patient is on furosemide and not taking potassium supplements - saw cardiology Juliann Pares) 12/30/16 - BNP 02/02/18 was 68.0  2: HTN- - BP looks good today - saw PCP (Hande) 02/09/18 - BMP from 02/04/18 reviewed and showed sodium 138, potassium 3.4 and GFR >60  3: Tobacco use- - smoking 1 ppd  of cigarettes - complete cessation discussed for 3 minutes with patient  4: Obstructive sleep apnea- - says that he's had a sleep study done and has to call and make an appointment to get titration portion completed - discussed the potential risks of having untreated sleep apnea and how it can potentially affect his heart - this could certainly be contributing to his fatigue  Medication bottles were reviewed.  Return in 1 month or sooner for any questions/problems before then.

## 2018-03-16 ENCOUNTER — Ambulatory Visit: Payer: PRIVATE HEALTH INSURANCE | Attending: Family | Admitting: Family

## 2018-03-16 ENCOUNTER — Encounter: Payer: Self-pay | Admitting: Family

## 2018-03-16 VITALS — BP 120/84 | HR 89 | Resp 18 | Ht 73.0 in | Wt 252.5 lb

## 2018-03-16 DIAGNOSIS — Z720 Tobacco use: Secondary | ICD-10-CM | POA: Insufficient documentation

## 2018-03-16 DIAGNOSIS — K5730 Diverticulosis of large intestine without perforation or abscess without bleeding: Secondary | ICD-10-CM | POA: Insufficient documentation

## 2018-03-16 DIAGNOSIS — K219 Gastro-esophageal reflux disease without esophagitis: Secondary | ICD-10-CM | POA: Diagnosis not present

## 2018-03-16 DIAGNOSIS — F17200 Nicotine dependence, unspecified, uncomplicated: Secondary | ICD-10-CM | POA: Insufficient documentation

## 2018-03-16 DIAGNOSIS — R569 Unspecified convulsions: Secondary | ICD-10-CM | POA: Diagnosis not present

## 2018-03-16 DIAGNOSIS — I10 Essential (primary) hypertension: Secondary | ICD-10-CM

## 2018-03-16 DIAGNOSIS — F17210 Nicotine dependence, cigarettes, uncomplicated: Secondary | ICD-10-CM | POA: Diagnosis not present

## 2018-03-16 DIAGNOSIS — M109 Gout, unspecified: Secondary | ICD-10-CM | POA: Insufficient documentation

## 2018-03-16 DIAGNOSIS — G4733 Obstructive sleep apnea (adult) (pediatric): Secondary | ICD-10-CM | POA: Insufficient documentation

## 2018-03-16 DIAGNOSIS — I110 Hypertensive heart disease with heart failure: Secondary | ICD-10-CM | POA: Diagnosis not present

## 2018-03-16 DIAGNOSIS — Z79899 Other long term (current) drug therapy: Secondary | ICD-10-CM | POA: Insufficient documentation

## 2018-03-16 DIAGNOSIS — I509 Heart failure, unspecified: Secondary | ICD-10-CM | POA: Diagnosis present

## 2018-03-16 DIAGNOSIS — I5032 Chronic diastolic (congestive) heart failure: Secondary | ICD-10-CM

## 2018-03-16 LAB — BASIC METABOLIC PANEL
ANION GAP: 8 (ref 5–15)
BUN: 13 mg/dL (ref 6–20)
CO2: 27 mmol/L (ref 22–32)
Calcium: 9.2 mg/dL (ref 8.9–10.3)
Chloride: 104 mmol/L (ref 101–111)
Creatinine, Ser: 0.92 mg/dL (ref 0.61–1.24)
GLUCOSE: 80 mg/dL (ref 65–99)
POTASSIUM: 3.2 mmol/L — AB (ref 3.5–5.1)
SODIUM: 139 mmol/L (ref 135–145)

## 2018-03-16 NOTE — Patient Instructions (Addendum)
Continue weighing daily and call for an overnight weight gain of > 2 pounds or a weekly weight gain of >5 pounds.  Keep fluid intake to 64 ounces (2 hospital cups)

## 2018-03-17 ENCOUNTER — Telehealth: Payer: Self-pay | Admitting: Family

## 2018-03-17 MED ORDER — POTASSIUM CHLORIDE CRYS ER 20 MEQ PO TBCR: 20.0000 meq | EXTENDED_RELEASE_TABLET | Freq: Every day | ORAL | 3 refills | Status: DC

## 2018-03-17 NOTE — Telephone Encounter (Signed)
LM message on patient's sister's phone (Doris "Ezequiel Kayser) regarding lab results. Advised that patient needed to begin potassium once daily and will recheck lab work at his next visit.

## 2018-04-20 ENCOUNTER — Ambulatory Visit: Payer: PRIVATE HEALTH INSURANCE | Attending: Family | Admitting: Family

## 2018-04-20 ENCOUNTER — Encounter: Payer: Self-pay | Admitting: Family

## 2018-04-20 VITALS — BP 134/81 | HR 77 | Resp 18 | Ht 73.0 in | Wt 259.4 lb

## 2018-04-20 DIAGNOSIS — Z800 Family history of malignant neoplasm of digestive organs: Secondary | ICD-10-CM | POA: Diagnosis not present

## 2018-04-20 DIAGNOSIS — Z7952 Long term (current) use of systemic steroids: Secondary | ICD-10-CM | POA: Insufficient documentation

## 2018-04-20 DIAGNOSIS — Z9889 Other specified postprocedural states: Secondary | ICD-10-CM | POA: Insufficient documentation

## 2018-04-20 DIAGNOSIS — Z8249 Family history of ischemic heart disease and other diseases of the circulatory system: Secondary | ICD-10-CM | POA: Insufficient documentation

## 2018-04-20 DIAGNOSIS — I110 Hypertensive heart disease with heart failure: Secondary | ICD-10-CM | POA: Insufficient documentation

## 2018-04-20 DIAGNOSIS — Z803 Family history of malignant neoplasm of breast: Secondary | ICD-10-CM | POA: Insufficient documentation

## 2018-04-20 DIAGNOSIS — R569 Unspecified convulsions: Secondary | ICD-10-CM | POA: Insufficient documentation

## 2018-04-20 DIAGNOSIS — K219 Gastro-esophageal reflux disease without esophagitis: Secondary | ICD-10-CM | POA: Insufficient documentation

## 2018-04-20 DIAGNOSIS — I5032 Chronic diastolic (congestive) heart failure: Secondary | ICD-10-CM | POA: Insufficient documentation

## 2018-04-20 DIAGNOSIS — G4733 Obstructive sleep apnea (adult) (pediatric): Secondary | ICD-10-CM

## 2018-04-20 DIAGNOSIS — M109 Gout, unspecified: Secondary | ICD-10-CM | POA: Insufficient documentation

## 2018-04-20 DIAGNOSIS — I10 Essential (primary) hypertension: Secondary | ICD-10-CM

## 2018-04-20 DIAGNOSIS — F17210 Nicotine dependence, cigarettes, uncomplicated: Secondary | ICD-10-CM | POA: Diagnosis not present

## 2018-04-20 DIAGNOSIS — Z833 Family history of diabetes mellitus: Secondary | ICD-10-CM | POA: Insufficient documentation

## 2018-04-20 DIAGNOSIS — Z8042 Family history of malignant neoplasm of prostate: Secondary | ICD-10-CM | POA: Diagnosis not present

## 2018-04-20 DIAGNOSIS — Z720 Tobacco use: Secondary | ICD-10-CM

## 2018-04-20 DIAGNOSIS — Z79899 Other long term (current) drug therapy: Secondary | ICD-10-CM | POA: Diagnosis not present

## 2018-04-20 LAB — BASIC METABOLIC PANEL
Anion gap: 7 (ref 5–15)
BUN: 18 mg/dL (ref 6–20)
CO2: 23 mmol/L (ref 22–32)
CREATININE: 0.91 mg/dL (ref 0.61–1.24)
Calcium: 9.4 mg/dL (ref 8.9–10.3)
Chloride: 110 mmol/L (ref 98–111)
GFR calc Af Amer: >60 mL/min (ref >60)
Glucose, Bld: 115 mg/dL — ABNORMAL HIGH (ref 70–99)
Potassium: 4.1 mmol/L (ref 3.5–5.1)
SODIUM: 140 mmol/L (ref 135–145)

## 2018-04-20 NOTE — Progress Notes (Signed)
Patient ID: Ronald GallusJohn E Pratt, male    DOB: 11/12/1975, 42 y.o.   MRN: [id number]  HPI  Ronald Pratt is a 42 y/o male with a history of HTN, gout, GERD, seizures, obstructive sleep apnea, current tobacco use and chronic heart failure.   Echo report from 02/03/18 reviewed and showed an EF of 55-60% along with mild Ronald with normal PA pressure.   Admitted 02/02/18 due to acute on chronic heart failure. Initially needed IV lasix and then transitioned to oral diuretics. HTN medications resumed and he was discharged in 2 days. Was in the ED 12/12/17 due to the flu where he was treated and released.  He presents today for a follow-up visit with a chief complaint of moderate fatigue upon minimal exertion. He describes this as chronic in nature having been present for several years. He has associated shortness of breath, difficulty sleeping and gradual weight gain. Is currently finishing prednisone taper for severe gout flare and has been eating "everything". Is now receiving B12 injections every two weeks. Had a sleep study done but needs to schedule his titration portion.   Past Medical History:  Diagnosis Date  . CHF (congestive heart failure) (HCC)   . Diverticulosis large intestine w/o perforation or abscess w/o bleeding 12/11/2016  . Dysrhythmia    tacchycardia new..  put on metoprolol by dr. [place]n [place] for surgery  . GERD (gastroesophageal reflux disease)   . Gout   . Hypertension   . Obstructive sleep apnea   . 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  [name]. Umbilical hernia    Past Surgical History:  Procedure Laterality Date  . ESOPHAGOGASTRODUODENOSCOPY ENDOSCOPY  2014  . INSERTION OF MESH N/A 01/04/2017   Procedure: INSERTION OF MESH;  Surgeon: Henrene DodgeJose Piscoya, MD;  Location: [facility] ORS;  Service: General;  Laterality: N/A;  . UMBILICAL HERNIA REPAIR N/A 01/04/2017   Procedure: HERNIA REPAIR UMBILICAL ADULT with mesh;  Surgeon: Henrene DodgeJose Piscoya, MD;  Location: [facility] ORS;   Service: General;  Laterality: N/A;   Family History  Problem Relation Age of Onset  . Breast cancer Mother   . Heart disease Mother   . Prostate cancer Father   . Colon cancer Father   . Diabetes Sister   . Heart disease Sister    Social History   Tobacco Use  . Smoking status: Current Every Day Smoker    Packs/day: 1.00    Years: 20.00    Pack years: 20.00    Types: Cigarettes  . Smokeless tobacco: Never Used  Substance Use Topics  . Alcohol use: No    Comment: SOBER SINCE 2010   No Known Allergies  Prior to Admission medications   Medication Sig Start Date End Date Taking? Authorizing Provider  allopurinol (ZYLOPRIM) 100 MG tablet Take 100 mg by mouth daily.   Yes [provider]  furosemide (LASIX) 40 MG tablet Take 1 tablet (40 mg total) by mouth daily. 02/04/18 02/04/19 Yes Wieting, Richard, MD  indomethacin (INDOCIN) 25 MG capsule Take 25 mg by mouth 2 (two) times daily as needed.   Yes [provider]  losartan (COZAAR) 100 MG tablet Take 1 tablet (100 mg total) by mouth daily. 02/04/18  Yes Wieting, Richard, MD  metoprolol succinate (TOPROL-XL) 100 MG 24 hr tablet Take 1 tablet (100 mg total) by mouth every morning. Take with or immediately following a meal. 02/05/18  Yes Renae GlossWieting, Richard, MD  potassium chloride SA (K-DUR,KLOR-CON) 20 MEQ  tablet Take 1 tablet (20 mEq total) by mouth daily. 03/17/18  Yes Jordy Hewins A, FNP  predniSONE (STERAPRED UNI-PAK 21 TAB) 10 MG (21) TBPK tablet Take 10 mg by mouth as directed.   Yes [provider]    Review of Systems  Constitutional: Positive for fatigue (tire easily). Negative for appetite change.  HENT: Negative for congestion, postnasal drip and sore throat.   Eyes: Negative.   Respiratory: Positive for shortness of breath (worse with manual work). Negative for cough and chest tightness.   Cardiovascular: Negative for chest pain, palpitations and leg swelling.  Gastrointestinal: Negative for  abdominal distention and abdominal pain.  Endocrine: Negative.   Genitourinary: Negative.   Musculoskeletal: Negative for arthralgias and back pain.  Skin: Negative.   Allergic/Immunologic: Negative.   Neurological: Negative for dizziness and light-headedness.  Hematological: Negative for adenopathy. Does not bruise/bleed easily.  Psychiatric/Behavioral: Positive for sleep disturbance (not sleeping well). Negative for dysphoric mood. The patient is not nervous/anxious.    Vitals:   04/20/18 1150  BP: 134/81  Pulse: 77  Resp: 18  SpO2: 98%  Weight: 259 lb 6 oz (117.7 kg)  Height: 6\' 1"  (1.854 m)   Wt Readings from Last 3 Encounters:  04/20/18 259 lb 6 oz (117.7 kg)  03/16/18 252 lb 8 oz (114.5 kg)  02/04/18 248 lb 9.6 oz (112.8 kg)   Lab Results  Component Value Date   CREATININE 0.92 03/16/2018   CREATININE 0.97 02/04/2018   CREATININE 1.07 02/03/2018    Physical Exam  Constitutional: He is oriented to person, place, and time. He appears well-developed and well-nourished.  HENT:  Head: Normocephalic and atraumatic.  Neck: Normal range of motion. Neck supple. No JVD present.  Cardiovascular: Normal rate and regular rhythm.  Pulmonary/Chest: Effort normal. No respiratory distress. He has no wheezes. He has no rales.  Abdominal: Soft. He exhibits no distension.  Musculoskeletal: He exhibits no edema.       Right lower leg: He exhibits no tenderness.       Left lower leg: He exhibits no tenderness.  Neurological: He is alert and oriented to person, place, and time.  Skin: Skin is warm and dry.  Psychiatric: He has a normal mood and affect. His behavior is normal.  Nursing note and vitals reviewed.  Assessment & Plan:  1: Chronic heart failure with preserved ejection fraction- - NYHA class III - euvolemic today - weighing daily and he was reminded to call for an overnight weight gain of >2 pounds or a weekly weight gain of >5 pounds - weight up 7 pounds since he was  last here and he thinks it's because of the prednisone taper - not adding salt to his food and has started reading food labels. Reviewed the importance of closely following a 2000mg  sodium diet  - instructed to keep daily fluid intake to 64 ounces daily - will get a BMP today since patient was started on potassium ~ 1 month ago - saw cardiology Juliann Pares) 12/30/16 - BNP 02/02/18 was 68.0  2: HTN- - BP looks good today - saw PCP (Hande) 04/12/18 - BMP from 04/12/18 reviewed and showed sodium 140, potassium 3.6 and GFR 100  3: Tobacco use- - smoking 1 ppd of cigarettes - complete cessation discussed for 3 minutes with patient  4: Obstructive sleep apnea- - says that he's had a sleep study done and has to call and make an appointment to get titration portion completed. [facility] phone number given to patient -  discussed the potential risks of having untreated sleep apnea and how it can potentially affect his heart - this could certainly be contributing to his fatigue  Medication bottles were reviewed.  Return in 3 months or sooner for any questions/problems before then.

## 2018-04-20 NOTE — Patient Instructions (Signed)
Continue weighing daily and call for an overnight weight gain of > 2 pounds or a weekly weight gain of >5 pounds.  Bring medication bottles to every visit 

## 2018-07-19 NOTE — Progress Notes (Signed)
Patient ID: Ronald Pratt, male    DOB: 10-Mar-1976, 42 y.o.   MRN: [id number]  HPI  Ronald Pratt is a 42 y/o male with a history of HTN, gout, GERD, seizures, obstructive sleep apnea, current tobacco use and chronic heart failure.   Echo report from 02/03/18 reviewed and showed an EF of 55-60% along with mild Ronald with normal PA pressure.   Admitted 02/02/18 due to acute on chronic heart failure. Initially needed IV lasix and then transitioned to oral diuretics. HTN medications resumed and he was discharged in 2 days. Was in the ED 12/12/17 due to the flu where he was treated and released.  He presents today for a follow-up visit with a chief complaint of moderate fatigue upon minimal exertion. He describes this as chronic in nature having been present for several years. He has associated shortness of breath, difficulty sleeping and weight gain along with this. He denies any abdominal distention, palpitations, pedal edema, chest pain, cough or dizziness. Says that his diuretic has been changed to low dose torsemide but he hasn't noticed much improvement.   Past Medical History:  Diagnosis Date  . CHF (congestive heart failure) (HCC)   . Diverticulosis large intestine w/o perforation or abscess w/o bleeding 12/11/2016  . Dysrhythmia    tacchycardia new..  put on metoprolol by dr. Juliann Pares for surgery  . GERD (gastroesophageal reflux disease)   . Gout   . Hypertension   . Obstructive sleep apnea   . 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  [name] Umbilical hernia    Past Surgical History:  Procedure Laterality Date  . ESOPHAGOGASTRODUODENOSCOPY ENDOSCOPY  2014  . INSERTION OF MESH N/A 01/04/2017   Procedure: INSERTION OF MESH;  Surgeon: Henrene Dodge, MD;  Location: [facility] ORS;  Service: General;  Laterality: N/A;  . UMBILICAL HERNIA REPAIR N/A 01/04/2017   Procedure: HERNIA REPAIR UMBILICAL ADULT with mesh;  Surgeon: Henrene Dodge, MD;  Location: [facility] ORS;   Service: General;  Laterality: N/A;   Family History  Problem Relation Age of Onset  . Breast cancer Mother   . Heart disease Mother   . Prostate cancer Father   . Colon cancer Father   . Diabetes Sister   . Heart disease Sister    Social History   Tobacco Use  . Smoking status: Current Every Day Smoker    Packs/day: 1.00    Years: 20.00    Pack years: 20.00    Types: Cigarettes  . Smokeless tobacco: Never Used  Substance Use Topics  . Alcohol use: No    Comment: SOBER SINCE 2010   No Known Allergies  Prior to Admission medications   Medication Sig Start Date End Date Taking? Authorizing Provider  allopurinol (ZYLOPRIM) 100 MG tablet Take 100 mg by mouth daily.   Yes [provider]  indomethacin (INDOCIN) 25 MG capsule Take 25 mg by mouth 2 (two) times daily as needed.   Yes [provider]  losartan (COZAAR) 100 MG tablet Take 1 tablet (100 mg total) by mouth daily. 02/04/18  Yes Wieting, Richard, MD  metoprolol succinate (TOPROL-XL) 100 MG 24 hr tablet Take 1 tablet (100 mg total) by mouth every morning. Take with or immediately following a meal. 02/05/18  Yes Wieting, Richard, MD  potassium chloride SA (K-DUR,KLOR-CON) 20 MEQ tablet Take 1 tablet (20 mEq total) by mouth daily. 03/17/18  Yes Clarisa Kindred A, FNP  torsemide (DEMADEX) 20 MG tablet  Take 20 mg by mouth daily. 07/13/18  Yes [provider]   Review of Systems  Constitutional: Positive for fatigue (tire easily). Negative for appetite change.  HENT: Negative for congestion, postnasal drip and sore throat.   Eyes: Positive for visual disturbance (blurry vision @ times).  Respiratory: Positive for shortness of breath (worse with manual work). Negative for cough and chest tightness.   Cardiovascular: Negative for chest pain, palpitations and leg swelling.  Gastrointestinal: Negative for abdominal distention and abdominal pain.  Endocrine: Negative.   Genitourinary: Negative.    Musculoskeletal: Negative for arthralgias and back pain.  Skin: Negative.   Allergic/Immunologic: Negative.   Neurological: Negative for dizziness and light-headedness.  Hematological: Negative for adenopathy. Does not bruise/bleed easily.  Psychiatric/Behavioral: Positive for sleep disturbance (not sleeping well). Negative for dysphoric mood. The patient is not nervous/anxious.    Vitals:   07/20/18 1153 07/20/18 1208  BP: (!) 125/100 124/90  Pulse: 96   Resp: 18   SpO2: 99%   Weight: 265 lb 8 oz (120.4 kg)   Height: 6\' 1"  (1.854 m)    Wt Readings from Last 3 Encounters:  07/20/18 265 lb 8 oz (120.4 kg)  04/20/18 259 lb 6 oz (117.7 kg)  03/16/18 252 lb 8 oz (114.5 kg)   Lab Results  Component Value Date   CREATININE 0.91 04/20/2018   CREATININE 0.92 03/16/2018   CREATININE 0.97 02/04/2018    Physical Exam  Constitutional: He is oriented to person, place, and time. He appears well-developed and well-nourished.  HENT:  Head: Normocephalic and atraumatic.  Neck: Normal range of motion. Neck supple. No JVD present.  Cardiovascular: Normal rate and regular rhythm.  Pulmonary/Chest: Effort normal. No respiratory distress. He has no wheezes. He has no rales.  Abdominal: Soft. He exhibits distension.  Musculoskeletal: He exhibits no edema.       Right lower leg: He exhibits no tenderness.       Left lower leg: He exhibits no tenderness.  Neurological: He is alert and oriented to person, place, and time.  Skin: Skin is warm and dry.  Psychiatric: He has a normal mood and affect. His behavior is normal.  Nursing note and vitals reviewed.  Assessment & Plan:  1: Chronic heart failure with preserved ejection fraction- - NYHA class III - minimally fluid overloaded today - weighing daily and he was reminded to call for an overnight weight gain of >2 pounds or a weekly weight gain of >5 pounds - weight up 9 pounds in the last 2 months - not adding salt to his food and has  started reading food labels. Reviewed the importance of closely following a 2000mg  sodium diet  - instructed to keep daily fluid intake to 64 ounces daily as he says that he's probably drinking 100-120 ounces daily - says that he sweats a lot at work which is why he drinks so much fluid - will increase torsemide to 40mg  daily and will check a BMP at his next visit - saw cardiology Juliann Pares([place]) 12/30/16 - BNP 02/02/18 was 68.0  2: HTN- - BP mildly elevated today; increasing torsemide per above - saw PCP (Hande) 07/13/18 - BMP from 04/20/18 reviewed and showed sodium 140, potassium 4.1, creatinine 0.91 and GFR >60  3: Tobacco use- - smoking 1 ppd of cigarettes - complete cessation discussed for 3 minutes with patient  4: Obstructive sleep apnea- - says that he's had a sleep study done and has to call and make an appointment to  get titration portion completed. [facility] phone number given to patient - discussed the potential risks of having untreated sleep apnea and how it can potentially affect his heart - this could certainly be contributing to his fatigue  Medication bottles were reviewed.  Return in 10 days or sooner for any questions/problems before then.

## 2018-07-20 ENCOUNTER — Encounter: Payer: Self-pay | Admitting: Family

## 2018-07-20 ENCOUNTER — Ambulatory Visit: Payer: PRIVATE HEALTH INSURANCE | Attending: Family | Admitting: Family

## 2018-07-20 VITALS — BP 124/90 | HR 96 | Resp 18 | Ht 73.0 in | Wt 265.5 lb

## 2018-07-20 DIAGNOSIS — M109 Gout, unspecified: Secondary | ICD-10-CM | POA: Insufficient documentation

## 2018-07-20 DIAGNOSIS — K219 Gastro-esophageal reflux disease without esophagitis: Secondary | ICD-10-CM | POA: Diagnosis not present

## 2018-07-20 DIAGNOSIS — Z720 Tobacco use: Secondary | ICD-10-CM

## 2018-07-20 DIAGNOSIS — Z79899 Other long term (current) drug therapy: Secondary | ICD-10-CM | POA: Insufficient documentation

## 2018-07-20 DIAGNOSIS — F17210 Nicotine dependence, cigarettes, uncomplicated: Secondary | ICD-10-CM | POA: Diagnosis not present

## 2018-07-20 DIAGNOSIS — G4733 Obstructive sleep apnea (adult) (pediatric): Secondary | ICD-10-CM | POA: Diagnosis not present

## 2018-07-20 DIAGNOSIS — I110 Hypertensive heart disease with heart failure: Secondary | ICD-10-CM | POA: Diagnosis not present

## 2018-07-20 DIAGNOSIS — I5032 Chronic diastolic (congestive) heart failure: Secondary | ICD-10-CM

## 2018-07-20 DIAGNOSIS — I509 Heart failure, unspecified: Secondary | ICD-10-CM | POA: Diagnosis present

## 2018-07-20 DIAGNOSIS — I10 Essential (primary) hypertension: Secondary | ICD-10-CM

## 2018-07-20 NOTE — Patient Instructions (Addendum)
Continue weighing daily and call for an overnight weight gain of > 2 pounds or a weekly weight gain of >5 pounds.  Increase torsemide to 2 tablets daily (40mg  daily)

## 2018-07-21 ENCOUNTER — Encounter: Payer: Self-pay | Admitting: Family

## 2018-07-28 NOTE — Progress Notes (Signed)
Patient ID: Ronald Pratt, male    DOB: 1975/10/29, 42 y.o.   MRN: [id number]  HPI  Ronald Pratt is a 42 y/o male with a history of HTN, gout, GERD, seizures, obstructive sleep apnea, current tobacco use and chronic heart failure.   Echo report from 02/03/18 reviewed and showed an EF of 55-60% along with mild Ronald with normal PA pressure.   Admitted 02/02/18 due to acute on chronic heart failure. Initially needed IV lasix and then transitioned to oral diuretics. HTN medications resumed and he was discharged in 2 days. Was in the ED 12/12/17 due to the flu where he was treated and released.  He presents today for a follow-up visit with a chief complaint of moderate fatigue upon minimal exertion. He describes this as chronic in nature having been present for several weeks. He does feel like he's more fatigued than usual. He has associated shortness of breath, headaches, light-headedness, nausea and difficulty sleeping along with this. He denies any cough, chest pain, pedal edema, palpitations, abdominal distention or weight gain. He says that 2 days ago, he developed a headache, nausea and worsening fatigue. Denies any fevers or muscle aches.   Past Medical History:  Diagnosis Date  . CHF (congestive heart failure) (HCC)   . Diverticulosis large intestine w/o perforation or abscess w/o bleeding 12/11/2016  . Dysrhythmia    tacchycardia new..  put on metoprolol by dr. Juliann Pares for surgery  . GERD (gastroesophageal reflux disease)   . Gout   . Hypertension   . Obstructive sleep apnea   . 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  [name] Umbilical hernia    Past Surgical History:  Procedure Laterality Date  . ESOPHAGOGASTRODUODENOSCOPY ENDOSCOPY  2014  . INSERTION OF MESH N/A 01/04/2017   Procedure: INSERTION OF MESH;  Surgeon: Henrene Dodge, MD;  Location: [facility] OR[facility];  Service: General;  Laterality: N/A;  . UMBILICAL HERNIA REPAIR N/A 01/04/2017   Procedure: HERNIA  REPAIR UMBILICAL ADULT with mesh;  Surgeon: Henrene Dodge, MD;  Location: [facility] OR[facility];  Service: General;  Laterality: N/A;   Family History  Problem Relation Age of Onset  . Breast cancer Mother   . Heart disease Mother   . Prostate cancer Father   . Colon cancer Father   . Diabetes Sister   . Heart disease Sister    Social History   Tobacco Use  . Smoking status: Current Every Day Smoker    Packs/day: 1.00    Years: 20.00    Pack years: 20.00    Types: Cigarettes  . Smokeless tobacco: Never Used  Substance Use Topics  . Alcohol use: No    Comment: SOBER SINCE 2010   No Known Allergies  Prior to Admission medications   Medication Sig Start Date End Date Taking? Authorizing Provider  allopurinol (ZYLOPRIM) 100 MG tablet Take 100 mg by mouth daily.   Yes [provider]  indomethacin (INDOCIN) 25 MG capsule Take 25 mg by mouth 2 (two) times daily as needed.   Yes [provider]  losartan (COZAAR) 100 MG tablet Take 1 tablet (100 mg total) by mouth daily. 02/04/18  Yes Wieting, Richard, MD  metoprolol succinate (TOPROL-XL) 100 MG 24 hr tablet Take 1 tablet (100 mg total) by mouth every morning. Take with or immediately following a meal. 02/05/18  Yes Wieting, Richard, MD  potassium chloride SA (K-DUR,KLOR-CON) 20 MEQ tablet Take 1 tablet (20 mEq total) by mouth  daily. 03/17/18  Yes Clarisa Kindred A, FNP  torsemide (DEMADEX) 20 MG tablet Take 40 mg by mouth daily.  07/13/18  Yes [provider]    Review of Systems  Constitutional: Positive for fatigue (tire easily). Negative for appetite change and fever.  HENT: Negative for congestion, postnasal drip and sore throat.   Eyes: Positive for visual disturbance (blurry vision @ times).  Respiratory: Positive for shortness of breath (worse with manual work). Negative for cough and chest tightness.   Cardiovascular: Negative for chest pain, palpitations and leg swelling.  Gastrointestinal: Positive for nausea.  Negative for abdominal distention and abdominal pain.  Endocrine: Negative.   Genitourinary: Negative.   Musculoskeletal: Negative for arthralgias and back pain.  Skin: Negative.   Allergic/Immunologic: Negative.   Neurological: Positive for light-headedness and headaches. Negative for dizziness.  Hematological: Negative for adenopathy. Does not bruise/bleed easily.  Psychiatric/Behavioral: Positive for sleep disturbance (not sleeping well). Negative for dysphoric mood. The patient is not nervous/anxious.    Vitals:   07/31/18 0836 07/31/18 0846  BP: (!) 130/115 (!) 130/115  Pulse: (!) 106   Resp: 18   SpO2: 99%   Weight: 260 lb (117.9 kg)   Height: 6\' 1"  (1.854 m)    Wt Readings from Last 3 Encounters:  07/31/18 260 lb (117.9 kg)  07/20/18 265 lb 8 oz (120.4 kg)  04/20/18 259 lb 6 oz (117.7 kg)   Lab Results  Component Value Date   CREATININE 0.91 04/20/2018   CREATININE 0.92 03/16/2018   CREATININE 0.97 02/04/2018     Physical Exam  Constitutional: He is oriented to person, place, and time. He appears well-developed and well-nourished.  HENT:  Head: Normocephalic and atraumatic.  Neck: Normal range of motion. Neck supple. No JVD present.  Cardiovascular: Normal rate and regular rhythm.  Pulmonary/Chest: Effort normal. No respiratory distress. He has no wheezes. He has no rales.  Abdominal: Soft. He exhibits no distension.  Musculoskeletal: He exhibits no edema.       Right lower leg: He exhibits no tenderness.       Left lower leg: He exhibits no tenderness.  Neurological: He is alert and oriented to person, place, and time.  Skin: Skin is warm and dry.  Psychiatric: He has a normal mood and affect. His behavior is normal.  Nursing note and vitals reviewed.  Assessment & Plan:  1: Chronic heart failure with preserved ejection fraction- - NYHA class III - euvolemic today - weighing daily and he was reminded to call for an overnight weight gain of >2 pounds or a  weekly weight gain of >5 pounds - weight down 5 pounds in the last 2 weeks - not adding salt to his food and has been reading food labels. Reviewed the importance of closely following a 2000mg  sodium diet  - instructed to keep daily fluid intake to 64 ounces daily as he says that he's probably drinking 100-120 ounces daily - says that he sweats a lot at work which is why he drinks so much fluid - will get BMP today due to increasing torsemide at last visit - saw cardiology Juliann Pares) 12/30/16 - BNP 02/02/18 was 68.0  2: HTN- - BP elevated today even upon recheck (130/110); he says that he's taken his medications this morning - saw PCP (Hande) 07/13/18 - BMP from 04/20/18 reviewed and showed sodium 140, potassium 4.1, creatinine 0.91 and GFR >60 - patient to check BP at home and he's to call back should his diastolic remain >  100  3: Tobacco use- - smoking 1 ppd of cigarettes - complete cessation discussed for 3 minutes with patient  4: Obstructive sleep apnea- - says that he has a sleep study scheduled this week - discussed the potential risks of having untreated sleep apnea and how it can potentially affect his heart - this could certainly be contributing to his fatigue  5: Viral syndrome- - patient reports that for the last 2 days he's had a headache, nausea and sweats; denies fevers or muscle aches - encouraged rest and hydration - work note given today  - patient advised to follow-up with PCP should these symptoms continue  Medication bottles were reviewed.  Patient did not bring his medications nor a list. Each medication was verbally reviewed with the patient and he was encouraged to bring the bottles to every visit to confirm accuracy of list.  Return in 2 weeks or sooner for any questions/problems before then.

## 2018-07-31 ENCOUNTER — Ambulatory Visit: Payer: PRIVATE HEALTH INSURANCE | Attending: Family | Admitting: Family

## 2018-07-31 ENCOUNTER — Telehealth: Payer: Self-pay | Admitting: Family

## 2018-07-31 ENCOUNTER — Encounter: Payer: Self-pay | Admitting: Family

## 2018-07-31 VITALS — BP 130/115 | HR 106 | Resp 18 | Ht 73.0 in | Wt 260.0 lb

## 2018-07-31 DIAGNOSIS — M109 Gout, unspecified: Secondary | ICD-10-CM | POA: Insufficient documentation

## 2018-07-31 DIAGNOSIS — F17210 Nicotine dependence, cigarettes, uncomplicated: Secondary | ICD-10-CM | POA: Diagnosis not present

## 2018-07-31 DIAGNOSIS — Z79899 Other long term (current) drug therapy: Secondary | ICD-10-CM | POA: Insufficient documentation

## 2018-07-31 DIAGNOSIS — Z8249 Family history of ischemic heart disease and other diseases of the circulatory system: Secondary | ICD-10-CM | POA: Insufficient documentation

## 2018-07-31 DIAGNOSIS — R5383 Other fatigue: Secondary | ICD-10-CM | POA: Diagnosis not present

## 2018-07-31 DIAGNOSIS — R569 Unspecified convulsions: Secondary | ICD-10-CM | POA: Diagnosis not present

## 2018-07-31 DIAGNOSIS — K219 Gastro-esophageal reflux disease without esophagitis: Secondary | ICD-10-CM | POA: Insufficient documentation

## 2018-07-31 DIAGNOSIS — I110 Hypertensive heart disease with heart failure: Secondary | ICD-10-CM | POA: Insufficient documentation

## 2018-07-31 DIAGNOSIS — G4733 Obstructive sleep apnea (adult) (pediatric): Secondary | ICD-10-CM | POA: Diagnosis not present

## 2018-07-31 DIAGNOSIS — I5032 Chronic diastolic (congestive) heart failure: Secondary | ICD-10-CM | POA: Diagnosis not present

## 2018-07-31 DIAGNOSIS — I10 Essential (primary) hypertension: Secondary | ICD-10-CM

## 2018-07-31 DIAGNOSIS — Z720 Tobacco use: Secondary | ICD-10-CM

## 2018-07-31 DIAGNOSIS — B349 Viral infection, unspecified: Secondary | ICD-10-CM | POA: Diagnosis not present

## 2018-07-31 LAB — BASIC METABOLIC PANEL
ANION GAP: 15 (ref 5–15)
BUN: 26 mg/dL — ABNORMAL HIGH (ref 6–20)
CALCIUM: 9.2 mg/dL (ref 8.9–10.3)
CO2: 28 mmol/L (ref 22–32)
Chloride: 96 mmol/L — ABNORMAL LOW (ref 98–111)
Creatinine, Ser: 1.37 mg/dL — ABNORMAL HIGH (ref 0.61–1.24)
Glucose, Bld: 127 mg/dL — ABNORMAL HIGH (ref 70–99)
Potassium: 3.4 mmol/L — ABNORMAL LOW (ref 3.5–5.1)
Sodium: 139 mmol/L (ref 135–145)

## 2018-07-31 MED ORDER — POTASSIUM CHLORIDE CRYS ER 20 MEQ PO TBCR: 40.0000 meq | EXTENDED_RELEASE_TABLET | Freq: Every day | ORAL | 3 refills | Status: DC

## 2018-07-31 NOTE — Patient Instructions (Signed)
Continue weighing daily and call for an overnight weight gain of > 2 pounds or a weekly weight gain of >5 pounds. 

## 2018-07-31 NOTE — Telephone Encounter (Signed)
Spoke with patient regarding lab results that were obtained today (07/31/18). Potassium has declined slightly since torsemide was increased and is now 3.4. Renal function has declined some since torsemide was increased although GFR remains >60.  Advised patient to increase his potassium to 2 tablets daily, continue current torsemide dose and will recheck a BMP at his next visit in 2 weeks.  Patient verbalized understanding.

## 2018-08-02 ENCOUNTER — Ambulatory Visit: Payer: PRIVATE HEALTH INSURANCE | Attending: Internal Medicine

## 2018-08-02 DIAGNOSIS — G4733 Obstructive sleep apnea (adult) (pediatric): Secondary | ICD-10-CM | POA: Insufficient documentation

## 2018-08-13 NOTE — Progress Notes (Signed)
Patient ID: BUREN HAVEY, male    DOB: [birth year], 42 y.o.   MRN: [id number]  HPI  Mr Metzgar is a 42 y/o male with a history of HTN, gout, GERD, seizures, obstructive sleep apnea, current tobacco use and chronic heart failure.   Echo report from 02/03/18 reviewed and showed an EF of 55-60% along with mild MR with normal PA pressure.   Admitted 02/02/18 due to acute on chronic heart failure. Initially needed IV lasix and then transitioned to oral diuretics. HTN medications resumed and he was discharged in 2 days. Was in the ED 12/12/17 due to the flu where he was treated and released.  He presents today for a follow-up visit with a chief complaint of moderate fatigue upon minimal exertion. He describes this as chronic in nature having been present for several years. He has associated shortness of breath, headaches, difficulty sleeping and gradual weight gain. He denies any abdominal distention, palpitations, pedal edema, chest pain, cough or dizziness. He continues to take 1 potassium tablet daily as he didn't increase it per out last conversation. Waiting on CPAP equipment so that he can start that.   Past Medical History:  Diagnosis Date  . CHF (congestive heart failure) (HCC)   . Diverticulosis large intestine w/o perforation or abscess w/o bleeding 12/11/2016  . Dysrhythmia    tacchycardia new..  put on metoprolol by dr. Juliann Pares for surgery  . GERD (gastroesophageal reflux disease)   . Gout   . Hypertension   . Obstructive sleep apnea   . 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  [name] Umbilical hernia    Past Surgical History:  Procedure Laterality Date  . ESOPHAGOGASTRODUODENOSCOPY ENDOSCOPY  2014  . INSERTION OF MESH N/A 01/04/2017   Procedure: INSERTION OF MESH;  Surgeon: Henrene Dodge, MD;  Location: [facility] ORS;  Service: General;  Laterality: N/A;  . UMBILICAL HERNIA REPAIR N/A 01/04/2017   Procedure: HERNIA REPAIR UMBILICAL ADULT with mesh;   Surgeon: Henrene Dodge, MD;  Location: [facility] OR[facility];  Service: General;  Laterality: N/A;   Family History  Problem Relation Age of Onset  . Breast cancer Mother   . Heart disease Mother   . Prostate cancer Father   . Colon cancer Father   . Diabetes Sister   . Heart disease Sister    Social History   Tobacco Use  . Smoking status: Current Every Day Smoker    Packs/day: 1.00    Years: 20.00    Pack years: 20.00    Types: Cigarettes  . Smokeless tobacco: Never Used  Substance Use Topics  . Alcohol use: No    Comment: SOBER SINCE 2010   No Known Allergies  Prior to Admission medications   Medication Sig Start Date End Date Taking? Authorizing Provider  allopurinol (ZYLOPRIM) 100 MG tablet Take 100 mg by mouth daily.   Yes [provider]  indomethacin (INDOCIN) 25 MG capsule Take 25 mg by mouth 2 (two) times daily as needed.   Yes [provider]  losartan (COZAAR) 100 MG tablet Take 1 tablet (100 mg total) by mouth daily. 02/04/18  Yes Wieting, Richard, MD  metoprolol succinate (TOPROL-XL) 100 MG 24 hr tablet Take 1 tablet (100 mg total) by mouth every morning. Take with or immediately following a meal. 02/05/18  Yes Wieting, Richard, MD  potassium chloride SA (K-DUR,KLOR-CON) 20 MEQ tablet Take 2 tablets (40 mEq total) by mouth daily. Patient taking differently: Take  20 mEq by mouth daily.  07/31/18  Yes Shyne Lehrke A, FNP  torsemide (DEMADEX) 20 MG tablet Take 40 mg by mouth daily.  07/13/18  Yes [provider]    Review of Systems  Constitutional: Positive for fatigue (tire easily). Negative for appetite change and fever.  HENT: Negative for congestion, postnasal drip and sore throat.   Eyes: Positive for visual disturbance (blurry vision @ times).  Respiratory: Positive for shortness of breath (worse with manual work). Negative for cough and chest tightness.   Cardiovascular: Negative for chest pain, palpitations and leg swelling.  Gastrointestinal:  Negative for abdominal distention and abdominal pain.  Endocrine: Negative.   Genitourinary: Negative.   Musculoskeletal: Negative for arthralgias and back pain.  Skin: Negative.   Allergic/Immunologic: Negative.   Neurological: Positive for headaches. Negative for dizziness and light-headedness.  Hematological: Negative for adenopathy. Does not bruise/bleed easily.  Psychiatric/Behavioral: Positive for sleep disturbance (not sleeping well). Negative for dysphoric mood. The patient is not nervous/anxious.    Vitals:   08/14/18 0900  BP: (!) 133/114  Pulse: (!) 117  Resp: 18  SpO2: 98%  Weight: 267 lb (121.1 kg)  Height: 6\' 1"  (1.854 m)   Wt Readings from Last 3 Encounters:  08/14/18 267 lb (121.1 kg)  07/31/18 260 lb (117.9 kg)  07/20/18 265 lb 8 oz (120.4 kg)   Lab Results  Component Value Date   CREATININE 1.37 (H) 07/31/2018   CREATININE 0.91 04/20/2018   CREATININE 0.92 03/16/2018    Physical Exam  Constitutional: He is oriented to person, place, and time. He appears well-developed and well-nourished.  HENT:  Head: Normocephalic and atraumatic.  Neck: Normal range of motion. Neck supple. No JVD present.  Cardiovascular: Normal rate and regular rhythm.  Pulmonary/Chest: Effort normal. No respiratory distress. He has no wheezes. He has no rales.  Abdominal: Soft. He exhibits no distension.  Musculoskeletal: He exhibits no edema.       Right lower leg: He exhibits no tenderness.       Left lower leg: He exhibits no tenderness.  Neurological: He is alert and oriented to person, place, and time.  Skin: Skin is warm and dry.  Psychiatric: He has a normal mood and affect. His behavior is normal.  Nursing note and vitals reviewed.  Assessment & Plan:  1: Chronic heart failure with preserved ejection fraction- - NYHA class III - euvolemic today - weighing daily and he was reminded to call for an overnight weight gain of >2 pounds or a weekly weight gain of >5 pounds -  weight up 7 pounds since he was last here - not adding salt to his food and has been reading food labels. Reviewed the importance of closely following a 2000mg  sodium diet  - instructed to keep daily fluid intake to 64 ounces daily as he says that he's probably drinking 100-120 ounces daily - says that he sweats a lot at work which is why he drinks so much fluid - will get BMP today to evaluate potassium level as he has continued to take potassium daily instead of - saw cardiology Juliann Pares) 12/30/16 - BNP 02/02/18 was 68.0  2: HTN- - BP elevated and HR elevated as well - increase metoprolol succinate to 200mg  daily; advised him to use up what he has by taking 2 of the 100mg  tablets daily until gone and then begin new RX of 200mg  tablet daily - saw PCP (Hande) 07/13/18 - BMP from 07/31/18 reviewed and showed  sodium 139, potassium 3.4, creatinine 1.37 and GFR >60  3: Tobacco use- - smoking 1 ppd of cigarettes - complete cessation discussed for 3 minutes with patient  4: Obstructive sleep apnea- - waiting to start CPAP at 14 cm  Patient did not bring his medications nor a list. Each medication was verbally reviewed with the patient and he was encouraged to bring the bottles to every visit to confirm accuracy of list.   Return in 1 month or sooner for any questions/problems before then.

## 2018-08-14 ENCOUNTER — Encounter: Payer: Self-pay | Admitting: Family

## 2018-08-14 ENCOUNTER — Other Ambulatory Visit: Payer: Self-pay | Admitting: Family

## 2018-08-14 ENCOUNTER — Ambulatory Visit: Payer: PRIVATE HEALTH INSURANCE | Attending: Family | Admitting: Family

## 2018-08-14 VITALS — BP 133/114 | HR 117 | Resp 18 | Ht 73.0 in | Wt 267.0 lb

## 2018-08-14 DIAGNOSIS — G4733 Obstructive sleep apnea (adult) (pediatric): Secondary | ICD-10-CM | POA: Diagnosis not present

## 2018-08-14 DIAGNOSIS — I5032 Chronic diastolic (congestive) heart failure: Secondary | ICD-10-CM

## 2018-08-14 DIAGNOSIS — K219 Gastro-esophageal reflux disease without esophagitis: Secondary | ICD-10-CM | POA: Diagnosis not present

## 2018-08-14 DIAGNOSIS — I110 Hypertensive heart disease with heart failure: Secondary | ICD-10-CM | POA: Insufficient documentation

## 2018-08-14 DIAGNOSIS — Z79899 Other long term (current) drug therapy: Secondary | ICD-10-CM | POA: Diagnosis not present

## 2018-08-14 DIAGNOSIS — Z720 Tobacco use: Secondary | ICD-10-CM

## 2018-08-14 DIAGNOSIS — F17210 Nicotine dependence, cigarettes, uncomplicated: Secondary | ICD-10-CM | POA: Insufficient documentation

## 2018-08-14 DIAGNOSIS — M109 Gout, unspecified: Secondary | ICD-10-CM | POA: Insufficient documentation

## 2018-08-14 DIAGNOSIS — I10 Essential (primary) hypertension: Secondary | ICD-10-CM

## 2018-08-14 LAB — BASIC METABOLIC PANEL
ANION GAP: 11 (ref 5–15)
BUN: 17 mg/dL (ref 6–20)
CO2: 30 mmol/L (ref 22–32)
Calcium: 9.2 mg/dL (ref 8.9–10.3)
Chloride: 100 mmol/L (ref 98–111)
Creatinine, Ser: 1.27 mg/dL — ABNORMAL HIGH (ref 0.61–1.24)
GFR calc non Af Amer: >60 mL/min (ref >60)
GLUCOSE: 123 mg/dL — AB (ref 70–99)
POTASSIUM: 3.7 mmol/L (ref 3.5–5.1)
Sodium: 141 mmol/L (ref 135–145)

## 2018-08-14 MED ORDER — TORSEMIDE 20 MG PO TABS: 40.0000 mg | ORAL_TABLET | Freq: Every day | ORAL | 3 refills | Status: DC

## 2018-08-14 MED ORDER — POTASSIUM CHLORIDE CRYS ER 20 MEQ PO TBCR: 20.0000 meq | EXTENDED_RELEASE_TABLET | Freq: Every day | ORAL | 3 refills | Status: DC

## 2018-08-14 MED ORDER — METOPROLOL SUCCINATE ER 200 MG PO TB24: 200.0000 mg | ORAL_TABLET | ORAL | 3 refills | Status: DC

## 2018-08-14 NOTE — Patient Instructions (Signed)
Continue weighing daily and call for an overnight weight gain of > 2 pounds or a weekly weight gain of >5 pounds. 

## 2018-09-14 NOTE — Progress Notes (Deleted)
Patient ID: Ronald Pratt, male    DOB: 10/01/1976, 42 y.o.   MRN: [id number]  HPI  Ronald Pratt is a 42 y/o male with a history of HTN, gout, GERD, seizures, obstructive sleep apnea, current tobacco use and chronic heart failure.   Echo report from 02/03/18 reviewed and showed an EF of 55-60% along with mild Ronald with normal PA pressure.   Has not been admitted or been in the ED in the last 6 months.   He presents today for a follow-up visit with a chief complaint of   Past Medical History:  Diagnosis Date  . CHF (congestive heart failure) (HCC)   . Diverticulosis large intestine w/o perforation or abscess w/o bleeding 12/11/2016  . Dysrhythmia    tacchycardia new..  put on metoprolol by dr. Juliann Pares for surgery  . GERD (gastroesophageal reflux disease)   . Gout   . Hypertension   . Obstructive sleep apnea   . 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  [name] Umbilical hernia    Past Surgical History:  Procedure Laterality Date  . ESOPHAGOGASTRODUODENOSCOPY ENDOSCOPY  2014  . INSERTION OF MESH N/A 01/04/2017   Procedure: INSERTION OF MESH;  Surgeon: Henrene Dodge, MD;  Location: [facility] ORS;  Service: General;  Laterality: N/A;  . UMBILICAL HERNIA REPAIR N/A 01/04/2017   Procedure: HERNIA REPAIR UMBILICAL ADULT with mesh;  Surgeon: Henrene Dodge, MD;  Location: [facility] ORS;  Service: General;  Laterality: N/A;   Family History  Problem Relation Age of Onset  . Breast cancer Mother   . Heart disease Mother   . Prostate cancer Father   . Colon cancer Father   . Diabetes Sister   . Heart disease Sister    Social History   Tobacco Use  . Smoking status: Current Every Day Smoker    Packs/day: 1.00    Years: 20.00    Pack years: 20.00    Types: Cigarettes  . Smokeless tobacco: Never Used  Substance Use Topics  . Alcohol use: No    Comment: SOBER SINCE 2010   No Known Allergies    Review of Systems  Constitutional: Positive for fatigue (tire  easily). Negative for appetite change and fever.  HENT: Negative for congestion, postnasal drip and sore throat.   Eyes: Positive for visual disturbance (blurry vision @ times).  Respiratory: Positive for shortness of breath (worse with manual work). Negative for cough and chest tightness.   Cardiovascular: Negative for chest pain, palpitations and leg swelling.  Gastrointestinal: Negative for abdominal distention and abdominal pain.  Endocrine: Negative.   Genitourinary: Negative.   Musculoskeletal: Negative for arthralgias and back pain.  Skin: Negative.   Allergic/Immunologic: Negative.   Neurological: Positive for headaches. Negative for dizziness and light-headedness.  Hematological: Negative for adenopathy. Does not bruise/bleed easily.  Psychiatric/Behavioral: Positive for sleep disturbance (not sleeping well). Negative for dysphoric mood. The patient is not nervous/anxious.      Physical Exam  Constitutional: He is oriented to person, place, and time. He appears well-developed and well-nourished.  HENT:  Head: Normocephalic and atraumatic.  Neck: Normal range of motion. Neck supple. No JVD present.  Cardiovascular: Normal rate and regular rhythm.  Pulmonary/Chest: Effort normal. No respiratory distress. He has no wheezes. He has no rales.  Abdominal: Soft. He exhibits no distension.  Musculoskeletal: He exhibits no edema.       Right lower leg: He exhibits no tenderness.  Left lower leg: He exhibits no tenderness.  Neurological: He is alert and oriented to person, place, and time.  Skin: Skin is warm and dry.  Psychiatric: He has a normal mood and affect. His behavior is normal.  Nursing note and vitals reviewed.  Assessment & Plan:  1: Chronic heart failure with preserved ejection fraction- - NYHA class III - euvolemic today - weighing daily and he was reminded to call for an overnight weight gain of >2 pounds or a weekly weight gain of >5 pounds - weight  - not  adding salt to his food and has been reading food labels. Reviewed the importance of closely following a 2000mg  sodium diet  - instructed to keep daily fluid intake to 64 ounces daily as he says that he's probably drinking 100-120 ounces daily - says that he sweats a lot at work which is why he drinks so much fluid -  - saw cardiology Juliann Pares([place]) 12/30/16 - BNP 02/02/18 was 68.0  2: HTN- - BP - metoprolol succinate increased to 200mg  at last visit - saw PCP ([facility]) 07/13/18 - BMP from 08/14/18 reviewed and showed sodium 141, potassium 3.7, creatinine 1.27 and GFR >60  3: Tobacco use- - smoking 1 ppd of cigarettes - complete cessation discussed for 3 minutes with patient  4: Obstructive sleep apnea- - waiting to start CPAP at 14 cm  Patient did not bring his medications nor a list. Each medication was verbally reviewed with the patient and he was encouraged to bring the bottles to every visit to confirm accuracy of list.

## 2018-09-18 ENCOUNTER — Ambulatory Visit: Payer: PRIVATE HEALTH INSURANCE | Admitting: Family

## 2018-10-07 IMAGING — CT CT ABD-PELV W/ CM
2 of 5 series · 15 of 46 positions shown, 17 images · IV contrast (APPLIED)
Comparison: January 18, 2011

CLINICAL DATA: Pain in region of umbilical hernia.  Nausea.

EXAM:
CT ABDOMEN AND PELVIS WITH CONTRAST
TECHNIQUE: Multidetector CT imaging of the abdomen and pelvis was performed
using the standard protocol following bolus administration of
intravenous contrast. Oral contrast was also administered.
CONTRAST:  100mL R7EKID-OVV IOPAMIDOL (R7EKID-OVV) INJECTION 61%

[Series 2: routine abd/pel with · axial · 0.84mm/px · z∈[-593,-158]mm · 12 of 99 slices shown, 14 images]
[im 6/99  soft-tissue]
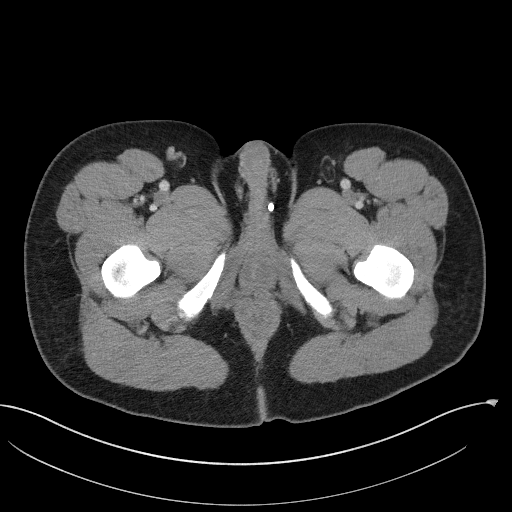
[im 6/99  bone]
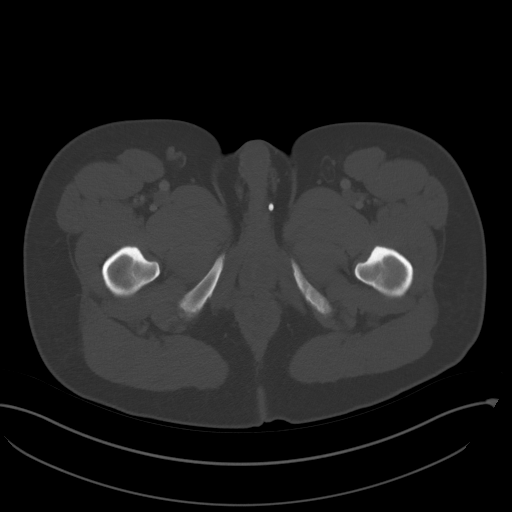
[im 16/99  soft-tissue]
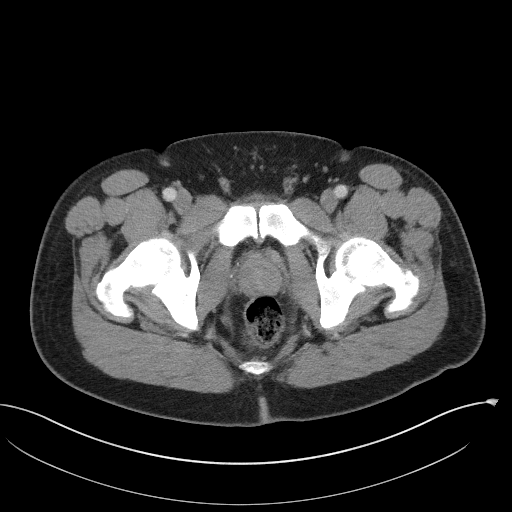
[im 21/99  soft-tissue]
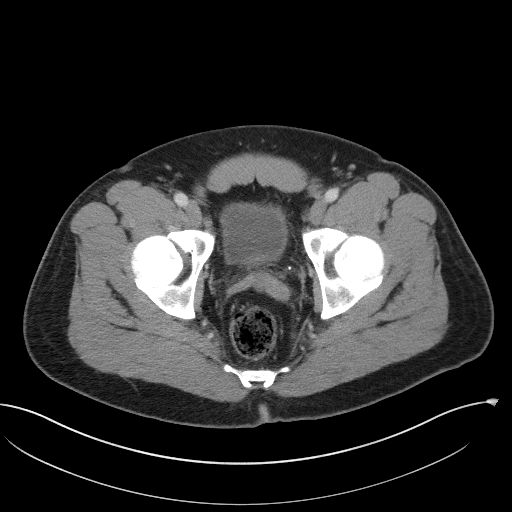
[im 31/99  soft-tissue]
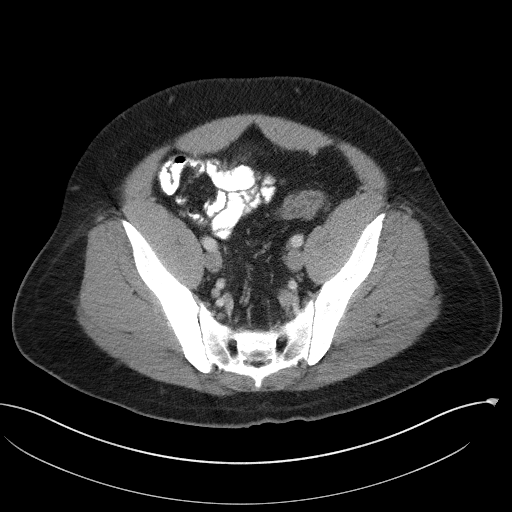
[im 37/99  soft-tissue]
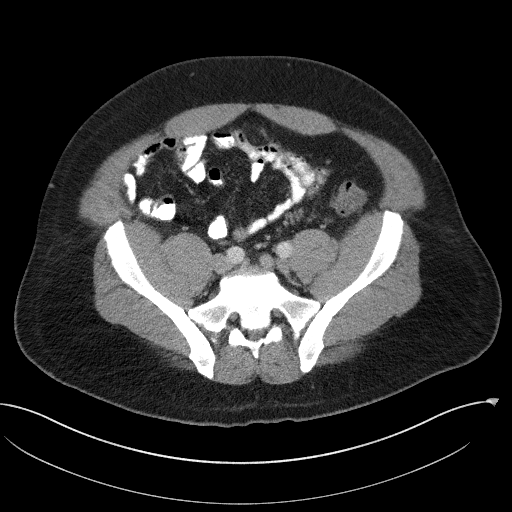
[im 47/99  soft-tissue]
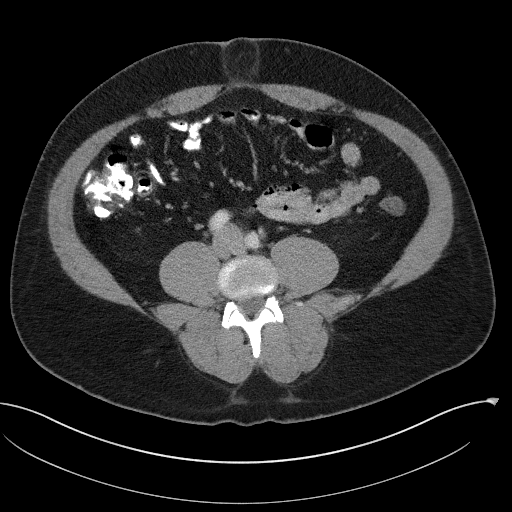
[im 52/99  soft-tissue]
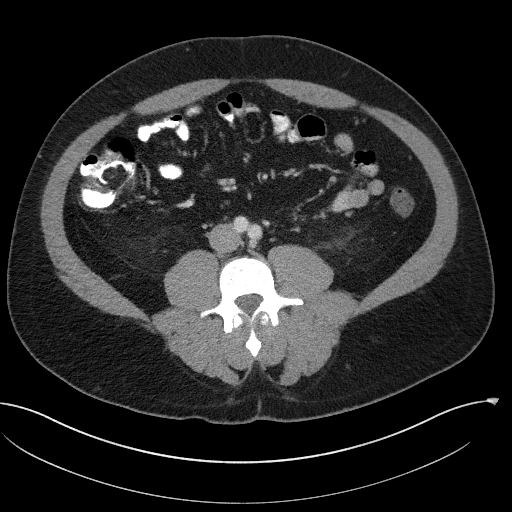
[im 62/99  soft-tissue]
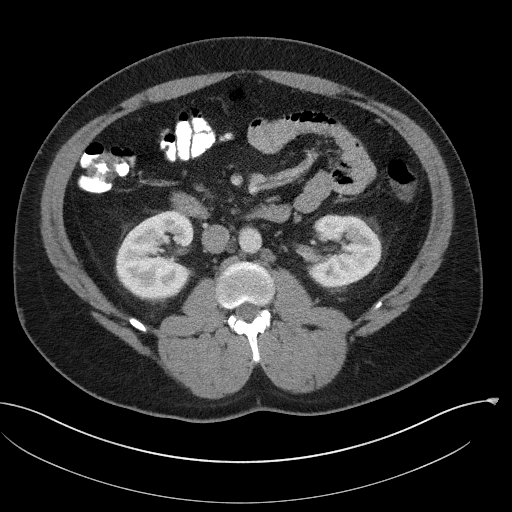
[im 68/99  soft-tissue]
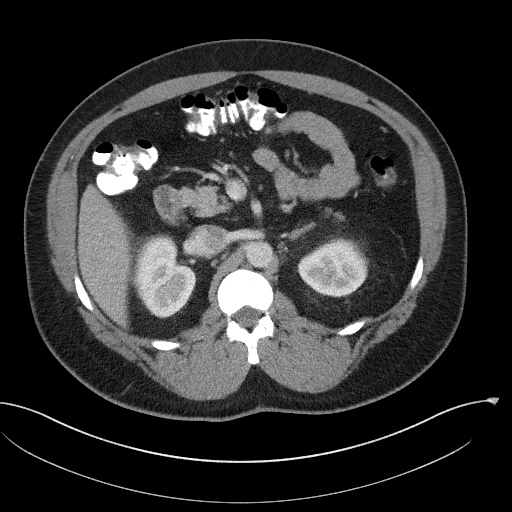
[im 68/99  bone]
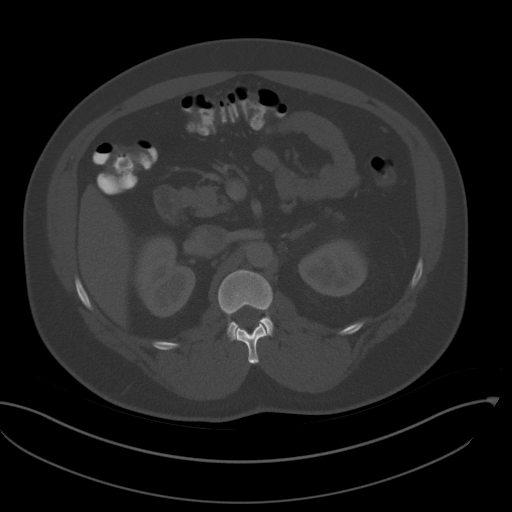
[im 78/99  soft-tissue]
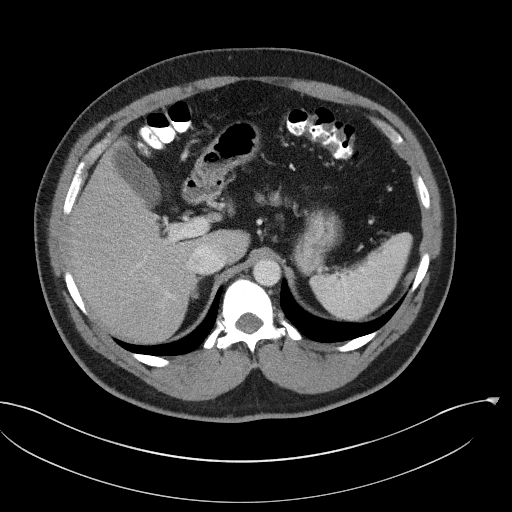
[im 83/99  soft-tissue]
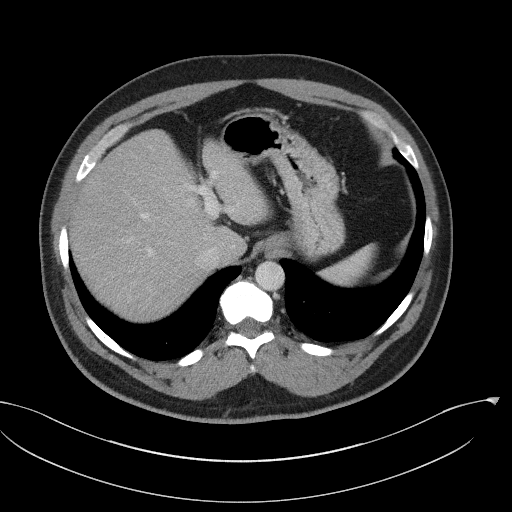
[im 93/99  soft-tissue]
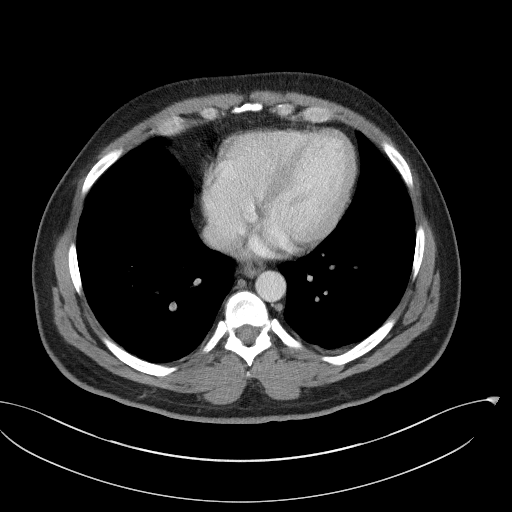

[Series 5: coronal st · coronal · 0.79mm/px · 3 of 107 slices shown]
[im 36/107  soft-tissue]
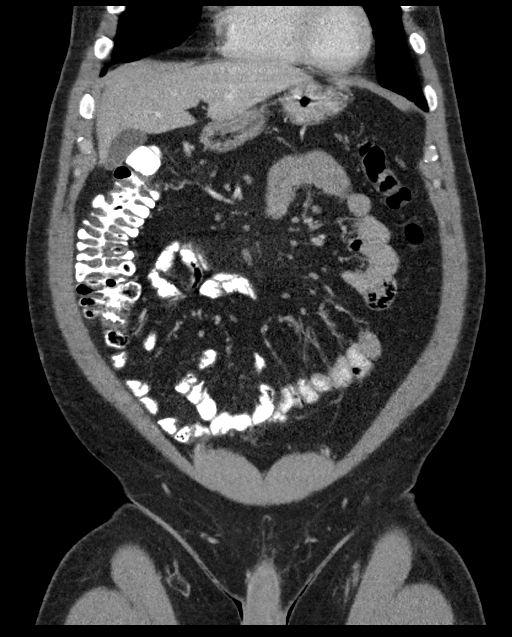
[im 48/107  soft-tissue]
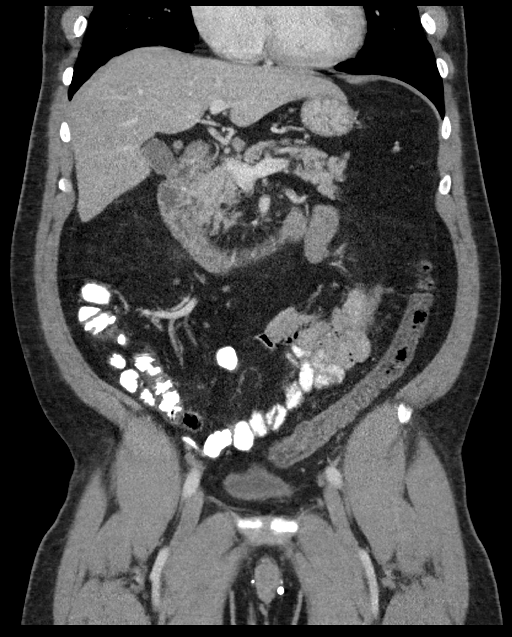
[im 59/107  soft-tissue]
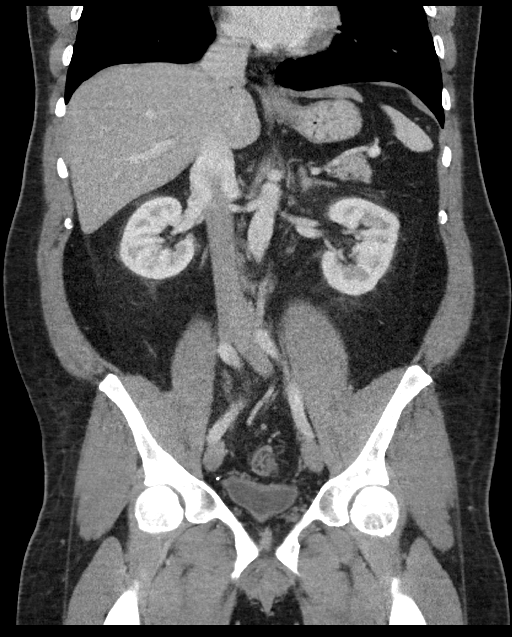

[15 of 46 positions shown; findings below may reference images not displayed]

FINDINGS: Lower chest: There is mild atelectatic change in lung bases. No lung
base edema or consolidation.

Hepatobiliary: There is a degree of hepatic steatosis. No focal
liver lesions are evident. Gallbladder wall is not appreciably
thickened. No biliary duct dilatation.

Pancreas: No pancreatic mass or inflammatory focus.

Spleen: No splenic lesions are evident.

Adrenals/Urinary Tract: Adrenals appear normal. Kidneys bilaterally
show no evidence of hydronephrosis. There is a mass arising from the
anterior upper to mid left kidney measuring 1.3 x 1.2 cm which has
attenuation values higher than is expected with a cyst. No other
renal mass evident. There is no apparent renal or ureteral calculus
on either side. Urinary bladder is midline with wall thickness
within normal limits.

Stomach/Bowel: There are scattered sigmoid diverticula without
diverticulitis. There is no appreciable bowel wall or mesenteric
thickening. There is lipomatous infiltration of the ileocecal valve.
There is no bowel obstruction. No free air or portal venous air.

Vascular/Lymphatic: There is no abdominal aortic aneurysm. No
vascular lesions are evident. By size criteria, there is no
adenopathy in the abdomen or pelvis. There are scattered
subcentimeter mesenteric lymph nodes, regarded as nonspecific.

Reproductive: Prostate and seminal vesicles are normal in size and
contour.

Other: There is an umbilical hernia containing only fat. The hernia
measures 2.4 cm from right to left dimension at its neck. The hernia
at its neck from superior to inferior dimension measures 1.6 cm.
There is no panniculitis noted in the area of the hernia.

Appendix appears normal. No abscess or ascites evident in the
abdomen or pelvis.

Musculoskeletal: There is mild calcified central disc protrusion at
L5-S1 without spinal stenosis. No blastic or lytic bone lesions. No
intramuscular or abdominal wall lesion evident.
IMPRESSION: Umbilical hernia containing only fat. No panniculitis seen in the
area of this hernia.

1.3 x 1.2 cm left adrenal mass which cannot be classified as a cyst.
Further evaluation with nonemergent pre and post contrast MRI should
be considered. Pre and post contrast CT could alternatively be
performed, but would likely be of decreased accuracy given lesion
size.

Mild hepatic steatosis.

Scattered sigmoid diverticula. No bowel obstruction. No abscess.
Appendix appears normal.

No renal or ureteral calculus.  No hydronephrosis.

## 2019-08-28 ENCOUNTER — Other Ambulatory Visit: Payer: Self-pay | Admitting: Family

## 2019-10-13 ENCOUNTER — Other Ambulatory Visit: Payer: Self-pay | Admitting: Family

## 2020-02-27 ENCOUNTER — Observation Stay: Payer: PRIVATE HEALTH INSURANCE

## 2020-02-27 ENCOUNTER — Observation Stay
Admission: EM | Admit: 2020-02-27 | Discharge: 2020-02-28 | Disposition: A | Discharge status: Home / Self Care | Payer: PRIVATE HEALTH INSURANCE | Attending: Internal Medicine | Admitting: Internal Medicine

## 2020-02-27 ENCOUNTER — Observation Stay
Admit: 2020-02-27 | Discharge: 2020-02-27 | Disposition: A | Discharge status: Home / Self Care | Payer: PRIVATE HEALTH INSURANCE | Attending: Internal Medicine | Admitting: Internal Medicine

## 2020-02-27 ENCOUNTER — Encounter: Payer: Self-pay | Admitting: Emergency Medicine

## 2020-02-27 ENCOUNTER — Other Ambulatory Visit: Payer: Self-pay

## 2020-02-27 ENCOUNTER — Emergency Department: Payer: PRIVATE HEALTH INSURANCE

## 2020-02-27 DIAGNOSIS — Z79899 Other long term (current) drug therapy: Secondary | ICD-10-CM | POA: Diagnosis not present

## 2020-02-27 DIAGNOSIS — M109 Gout, unspecified: Secondary | ICD-10-CM | POA: Diagnosis not present

## 2020-02-27 DIAGNOSIS — I509 Heart failure, unspecified: Secondary | ICD-10-CM

## 2020-02-27 DIAGNOSIS — Z6841 Body Mass Index (BMI) 40.0 and over, adult: Secondary | ICD-10-CM | POA: Diagnosis not present

## 2020-02-27 DIAGNOSIS — E669 Obesity, unspecified: Secondary | ICD-10-CM | POA: Insufficient documentation

## 2020-02-27 DIAGNOSIS — R778 Other specified abnormalities of plasma proteins: Secondary | ICD-10-CM | POA: Diagnosis not present

## 2020-02-27 DIAGNOSIS — F17210 Nicotine dependence, cigarettes, uncomplicated: Secondary | ICD-10-CM | POA: Insufficient documentation

## 2020-02-27 DIAGNOSIS — I10 Essential (primary) hypertension: Secondary | ICD-10-CM

## 2020-02-27 DIAGNOSIS — R55 Syncope and collapse: Secondary | ICD-10-CM | POA: Diagnosis not present

## 2020-02-27 DIAGNOSIS — R7989 Other specified abnormal findings of blood chemistry: Secondary | ICD-10-CM | POA: Insufficient documentation

## 2020-02-27 DIAGNOSIS — Z20822 Contact with and (suspected) exposure to covid-19: Secondary | ICD-10-CM | POA: Diagnosis not present

## 2020-02-27 DIAGNOSIS — F17200 Nicotine dependence, unspecified, uncomplicated: Secondary | ICD-10-CM | POA: Diagnosis present

## 2020-02-27 DIAGNOSIS — I5033 Acute on chronic diastolic (congestive) heart failure: Secondary | ICD-10-CM

## 2020-02-27 DIAGNOSIS — I110 Hypertensive heart disease with heart failure: Principal | ICD-10-CM | POA: Insufficient documentation

## 2020-02-27 DIAGNOSIS — G4733 Obstructive sleep apnea (adult) (pediatric): Secondary | ICD-10-CM | POA: Diagnosis not present

## 2020-02-27 DIAGNOSIS — Z720 Tobacco use: Secondary | ICD-10-CM

## 2020-02-27 DIAGNOSIS — R200 Anesthesia of skin: Secondary | ICD-10-CM | POA: Insufficient documentation

## 2020-02-27 LAB — BASIC METABOLIC PANEL
Anion gap: 9 (ref 5–15)
BUN: 14 mg/dL (ref 6–20)
CO2: 27 mmol/L (ref 22–32)
Calcium: 9.1 mg/dL (ref 8.9–10.3)
Chloride: 104 mmol/L (ref 98–111)
Creatinine, Ser: 1.37 mg/dL — ABNORMAL HIGH (ref 0.61–1.24)
GFR calc Af Amer: >60 mL/min (ref >60)
GFR calc non Af Amer: >60 mL/min (ref >60)
Glucose, Bld: 125 mg/dL — ABNORMAL HIGH (ref 70–99)
Potassium: 3.8 mmol/L (ref 3.5–5.1)
Sodium: 140 mmol/L (ref 135–145)

## 2020-02-27 LAB — CBC
HCT: 47.4 % (ref 39.0–52.0)
Hemoglobin: 15.8 g/dL (ref 13.0–17.0)
MCH: 32 pg (ref 26.0–34.0)
MCHC: 33.3 g/dL (ref 30.0–36.0)
MCV: 96.1 fL (ref 80.0–100.0)
Platelets: 228 10*3/uL (ref 150–400)
RBC: 4.93 MIL/uL (ref 4.22–5.81)
RDW: 13.5 % (ref 11.5–15.5)
WBC: 11 10*3/uL — ABNORMAL HIGH (ref 4.0–10.5)
nRBC: 0 % (ref 0.0–0.2)

## 2020-02-27 LAB — TROPONIN I (HIGH SENSITIVITY)
Troponin I (High Sensitivity): 112 ng/L (ref <18)
Troponin I (High Sensitivity): 111 ng/L (ref <18)
Troponin I (High Sensitivity): 95 ng/L — ABNORMAL HIGH (ref <18)
Troponin I (High Sensitivity): 81 ng/L — ABNORMAL HIGH (ref <18)
Troponin I (High Sensitivity): 62 ng/L — ABNORMAL HIGH (ref <18)

## 2020-02-27 LAB — HIV ANTIBODY (ROUTINE TESTING W REFLEX): HIV Screen 4th Generation wRfx: NONREACTIVE

## 2020-02-27 LAB — BRAIN NATRIURETIC PEPTIDE: B Natriuretic Peptide: 224 pg/mL — ABNORMAL HIGH (ref 0.0–100.0)

## 2020-02-27 LAB — RESPIRATORY PANEL BY RT PCR (FLU A&B, COVID)
Influenza A by PCR: NEGATIVE
Influenza B by PCR: NEGATIVE
SARS Coronavirus 2 by RT PCR: NEGATIVE

## 2020-02-27 MED ORDER — METOPROLOL SUCCINATE ER 25 MG PO TB24
25.0000 mg | ORAL_TABLET | Freq: Every day | ORAL | Status: DC
  Administered 2020-02-28: 25 mg via ORAL
  Filled 2020-02-27 (×2): qty 1

## 2020-02-27 MED ORDER — SODIUM CHLORIDE 0.9 % IV SOLN: 250.0000 mL | INTRAVENOUS | Status: DC | PRN

## 2020-02-27 MED ORDER — LOSARTAN POTASSIUM 50 MG PO TABS
100.0000 mg | ORAL_TABLET | Freq: Every day | ORAL | Status: DC
  Administered 2020-02-28: 100 mg via ORAL
  Filled 2020-02-27: qty 2

## 2020-02-27 MED ORDER — FUROSEMIDE 10 MG/ML IJ SOLN
40.0000 mg | Freq: Two times a day (BID) | INTRAMUSCULAR | Status: DC
  Administered 2020-02-27 – 2020-02-28 (×2): 40 mg via INTRAVENOUS
  Filled 2020-02-27 (×2): qty 4

## 2020-02-27 MED ORDER — ASPIRIN EC 81 MG PO TBEC
81.0000 mg | DELAYED_RELEASE_TABLET | Freq: Every day | ORAL | Status: DC
  Administered 2020-02-28: 81 mg via ORAL
  Filled 2020-02-27: qty 1

## 2020-02-27 MED ORDER — PANTOPRAZOLE SODIUM 40 MG PO TBEC
40.0000 mg | DELAYED_RELEASE_TABLET | Freq: Every day | ORAL | Status: DC
  Administered 2020-02-28: 40 mg via ORAL
  Filled 2020-02-27: qty 1

## 2020-02-27 MED ORDER — NICOTINE 21 MG/24HR TD PT24: 21.0000 mg | MEDICATED_PATCH | Freq: Every day | TRANSDERMAL | Status: DC

## 2020-02-27 MED ORDER — ASPIRIN 81 MG PO CHEW
324.0000 mg | CHEWABLE_TABLET | Freq: Once | ORAL | Status: AC
  Administered 2020-02-27: 324 mg via ORAL
  Filled 2020-02-27: qty 4

## 2020-02-27 MED ORDER — ALLOPURINOL 300 MG PO TABS
300.0000 mg | ORAL_TABLET | Freq: Every day | ORAL | Status: DC
  Administered 2020-02-28: 300 mg via ORAL
  Filled 2020-02-27 (×2): qty 1

## 2020-02-27 MED ORDER — ENOXAPARIN SODIUM 40 MG/0.4ML ~~LOC~~ SOLN
40.0000 mg | Freq: Two times a day (BID) | SUBCUTANEOUS | Status: DC
  Administered 2020-02-27: 40 mg via SUBCUTANEOUS
  Filled 2020-02-27: qty 0.4

## 2020-02-27 MED ORDER — FUROSEMIDE 10 MG/ML IJ SOLN
60.0000 mg | Freq: Once | INTRAMUSCULAR | Status: AC
  Administered 2020-02-27: 60 mg via INTRAVENOUS
  Filled 2020-02-27: qty 8

## 2020-02-27 MED ORDER — OMEPRAZOLE MAGNESIUM 20 MG PO TBEC: 20.0000 mg | DELAYED_RELEASE_TABLET | Freq: Every day | ORAL | Status: DC

## 2020-02-27 MED ORDER — SODIUM CHLORIDE 0.9% FLUSH: 3.0000 mL | INTRAVENOUS | Status: DC | PRN

## 2020-02-27 MED ORDER — NICOTINE 21 MG/24HR TD PT24
21.0000 mg | MEDICATED_PATCH | Freq: Every day | TRANSDERMAL | Status: DC
  Administered 2020-02-27 – 2020-02-28 (×2): 21 mg via TRANSDERMAL
  Filled 2020-02-27 (×2): qty 1

## 2020-02-27 MED ORDER — SODIUM CHLORIDE 0.9% FLUSH: 3.0000 mL | Freq: Two times a day (BID) | INTRAVENOUS | Status: DC

## 2020-02-27 MED ORDER — ACETAMINOPHEN 325 MG PO TABS: 650.0000 mg | ORAL_TABLET | ORAL | Status: DC | PRN

## 2020-02-27 MED ORDER — HYDRALAZINE HCL 20 MG/ML IJ SOLN: 5.0000 mg | INTRAMUSCULAR | Status: DC | PRN

## 2020-02-27 NOTE — ED Notes (Signed)
Pt given warm blanket.

## 2020-02-27 NOTE — ED Triage Notes (Signed)
Pt presents to ED c/o increased leg swelling bil, SOB with exertion, and 10lb weight gain in last week. Referred to ED by [facility] for IV Lasix. On torsemide, has been taking as directed.

## 2020-02-27 NOTE — TOC Initial Note (Signed)
Transition of Care [facility]) - Initial/Assessment Note    Patient Details  Name: Ronald Pratt MRN: [id number] Date of Birth: 08/22/1976  Transition of [facility]) CM/SW Contact:    Marina Goodell Phone Number: [phone] 02/27/2020, 4:27 PM  Clinical Narrative:                  TOC aware of consult, pending disposition.       Patient Goals and CMS Choice        Expected Discharge Plan and Services                                                Prior Living Arrangements/Services                       Activities of Daily Living      Permission Sought/Granted                  Emotional Assessment              Admission diagnosis:  Acute on chronic diastolic CHF (congestive heart failure) (HCC) [I50.33] Patient Active Problem List   Diagnosis Date Noted  . Syncope 02/27/2020  . Elevated troponin 02/27/2020  . Viral syndrome 07/31/2018  . Tobacco use 03/16/2018  . Obstructive sleep apnea 03/16/2018  . Acute on chronic diastolic CHF (congestive heart failure) (HCC) 02/03/2018  . Umbilical hernia   . Seizures (HCC) 01/18/2017  . Gout 01/18/2017  . GERD (gastroesophageal reflux disease) 01/18/2017  . Dysrhythmia 01/18/2017  . Benign essential hypertension 12/29/2016  . Diverticulosis of colon without hemorrhage 12/20/2016  . Umbilical hernia without obstruction and without gangrene 12/20/2016  . Diverticulosis large intestine w/o perforation or abscess w/o bleeding 12/11/2016   PCP:  Barbette Reichmann, MD Pharmacy:   [facility] [address], [place] - [address] [place] [address] Phone: [phone] Fax: [phone]     Social Determinants of Health (SDOH) Interventions    Readmission Risk Interventions No flowsheet data found.

## 2020-02-27 NOTE — ED Notes (Signed)
Pt reports SHOB for the past week that worsens while laying flat and at night.  Reports "passing out" this past week with +LOC.  Denies CP this week and at the moment.  Reports "only feeling bad when trying to walk" Swelling noted to BLE.

## 2020-02-27 NOTE — ED Provider Notes (Signed)
[facility] Emergency Department Provider Note    First MD Initiated Contact with Patient 02/27/20 1237     (approximate)  I have reviewed the triage vital signs and the nursing notes.   HISTORY  Chief Complaint Congestive Heart Failure    HPI Ronald Pratt is a 44 y.o. male the below listed past medical history on torsemide at home presents to the ER for 5 days of worsening edema shortness of breath orthopnea exertional dyspnea has had 2 episodes of syncopal episode while exerting himself.  He denies any chest pain or pressure.  States he has been increasing his torsemide on his own but is not having any increased urine output.  States that he is gained 10 pounds just in the past 48 hours.  Does feel severe orthopnea.  Is not on home oxygen.    Past Medical History:  Diagnosis Date  . CHF (congestive heart failure) (HCC)   . Diverticulosis large intestine w/o perforation or abscess w/o bleeding 12/11/2016  . Dysrhythmia    tacchycardia new..  put on metoprolol by dr. Juliann Pares for surgery  . GERD (gastroesophageal reflux disease)   . Gout   . Hypertension   . Obstructive sleep apnea   . 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  [name] Umbilical hernia    Family History  Problem Relation Age of Onset  . Breast cancer Mother   . Heart disease Mother   . Prostate cancer Father   . Colon cancer Father   . Diabetes Sister   . Heart disease Sister    Past Surgical History:  Procedure Laterality Date  . ESOPHAGOGASTRODUODENOSCOPY ENDOSCOPY  2014  . INSERTION OF MESH N/A 01/04/2017   Procedure: INSERTION OF MESH;  Surgeon: Henrene Dodge, MD;  Location: [facility] ORS;  Service: General;  Laterality: N/A;  . UMBILICAL HERNIA REPAIR N/A 01/04/2017   Procedure: HERNIA REPAIR UMBILICAL ADULT with mesh;  Surgeon: Henrene Dodge, MD;  Location: [facility] ORS;  Service: General;  Laterality: N/A;   Patient Active Problem List    Diagnosis Date Noted  . Viral syndrome 07/31/2018  . Tobacco use 03/16/2018  . Obstructive sleep apnea 03/16/2018  . CHF (congestive heart failure) (HCC) 02/03/2018  . Umbilical hernia   . Seizures (HCC) 01/18/2017  . Gout 01/18/2017  . GERD (gastroesophageal reflux disease) 01/18/2017  . Dysrhythmia 01/18/2017  . Benign essential hypertension 12/29/2016  . Diverticulosis of colon without hemorrhage 12/20/2016  . Umbilical hernia without obstruction and without gangrene 12/20/2016  . Diverticulosis large intestine w/o perforation or abscess w/o bleeding 12/11/2016      Prior to Admission medications   Medication Sig Start Date End Date Taking? Authorizing Provider  allopurinol (ZYLOPRIM) 100 MG tablet Take 100 mg by mouth daily.    [provider]  indomethacin (INDOCIN) 25 MG capsule Take 25 mg by mouth 2 (two) times daily as needed.    [provider]  losartan (COZAAR) 100 MG tablet Take 1 tablet (100 mg total) by mouth daily. 02/04/18   Alford Highland, MD  metoprolol succinate (TOPROL-XL) 200 MG 24 hr tablet Take 1 tablet (200 mg total) by mouth every morning. Take with or immediately following a meal. 08/14/18   Clarisa Kindred A, FNP  potassium chloride SA (K-DUR,KLOR-CON) 20 MEQ tablet Take 1 tablet (20 mEq total) by mouth daily. 08/14/18   Delma Freeze, FNP  torsemide (DEMADEX) 20 MG tablet Take 2 tablets (40  mg total) by mouth daily. 08/14/18   Delma Freeze, FNP    Allergies Patient has no known allergies.    Social History Social History   Tobacco Use  . Smoking status: Current Every Day Smoker    Packs/day: 1.00    Years: 20.00    Pack years: 20.00    Types: Cigarettes  . Smokeless tobacco: Never Used  Substance Use Topics  . Alcohol use: No    Comment: SOBER SINCE 2010  . Drug use: No    Review of Systems Patient denies headaches, rhinorrhea, blurry vision, numbness, shortness of breath, chest pain, edema, cough, abdominal pain,  nausea, vomiting, diarrhea, dysuria, fevers, rashes or hallucinations unless otherwise stated above in HPI. ____________________________________________   PHYSICAL EXAM:  VITAL SIGNS: Vitals:   02/27/20 1141  BP: 113/77  Pulse: (!) 103  Resp: 20  Temp: 97.8 F (36.6 C)  SpO2: 96%    Constitutional: Alert and oriented.  Eyes: Conjunctivae are normal.  Head: Atraumatic. Nose: No congestion/rhinnorhea. Mouth/Throat: Mucous membranes are moist.   Neck: No stridor. Painless ROM.  Cardiovascular: Normal rate, regular rhythm. Grossly normal heart sounds.  Good peripheral circulation. Respiratory: Normal respiratory effort.  No retractions. Lungs with posterior crackles Gastrointestinal: Soft and nontender. No distention. No abdominal bruits. No CVA tenderness. Genitourinary:  Musculoskeletal: No lower extremity tenderness, 2+ edema.  No joint effusions. Neurologic:  Normal speech and language. No gross focal neurologic deficits are appreciated. No facial droop Skin:  Skin is warm, dry and intact. No rash noted. Psychiatric: Mood and affect are normal. Speech and behavior are normal.  ____________________________________________   LABS (all labs ordered are listed, but only abnormal results are displayed)  Results for orders placed or performed during the hospital encounter of 02/27/20 (from the past 24 hour(s))  Basic metabolic panel     Status: Abnormal   Collection Time: 02/27/20 11:36 AM  Result Value Ref Range   Sodium 140 135 - 145 mmol/L   Potassium 3.8 3.5 - 5.1 mmol/L   Chloride 104 98 - 111 mmol/L   CO2 27 22 - 32 mmol/L   Glucose, Bld 125 (H) 70 - 99 mg/dL   BUN 14 6 - 20 mg/dL   Creatinine, Ser 0.86 (H) 0.61 - 1.24 mg/dL   Calcium 9.1 8.9 - 57.8 mg/dL   GFR calc non Af Amer >60 >60 mL/min   GFR calc Af Amer >60 >60 mL/min   Anion gap 9 5 - 15  CBC     Status: Abnormal   Collection Time: 02/27/20 11:36 AM  Result Value Ref Range   WBC 11.0 (H) 4.0 - 10.5 K/uL    RBC 4.93 4.22 - 5.81 MIL/uL   Hemoglobin 15.8 13.0 - 17.0 g/dL   HCT 46.9 62.9 - 52.8 %   MCV 96.1 80.0 - 100.0 fL   MCH 32.0 26.0 - 34.0 pg   MCHC 33.3 30.0 - 36.0 g/dL   RDW 41.3 24.4 - 01.0 %   Platelets 228 150 - 400 K/uL   nRBC 0.0 0.0 - 0.2 %  Brain natriuretic peptide     Status: Abnormal   Collection Time: 02/27/20 11:36 AM  Result Value Ref Range   B Natriuretic Peptide 224.0 (H) 0.0 - 100.0 pg/mL  Troponin I (High Sensitivity)     Status: Abnormal   Collection Time: 02/27/20 11:36 AM  Result Value Ref Range   Troponin I (High Sensitivity) 112 (HH) <18 ng/L   ____________________________________________  EKG My  review and personal interpretation at Time: 11:32   Indication: sob  Rate: 100  Rhythm: sinus Axis: normal Other: nonspecific st and t wave changes from previous, no stemi criteria ____________________________________________  RADIOLOGY  I personally reviewed all radiographic images ordered to evaluate for the above acute complaints and reviewed radiology reports and findings.  These findings were personally discussed with the patient.  Please see medical record for radiology report.  ____________________________________________   PROCEDURES  Procedure(s) performed:  Procedures    Critical Care performed: no ____________________________________________   INITIAL IMPRESSION / ASSESSMENT AND PLAN / ED COURSE  Pertinent labs & imaging results that were available during my care of the patient were reviewed by me and considered in my medical decision making (see chart for details).   DDX: Asthma, copd, CHF, pna, ptx, malignancy, Pe, anemia   Ronald Pratt is a 44 y.o. who presents to the ED with symptoms as described above.  Patient currently nontoxic-appearing but has significant weight gain despite outpatient increasing his diuretic at home.  He is currently afebrile mildly tachycardic.  Does have significant edema throughout.  Will require IV Lasix.   His EKG is changed from previous but he denies any chest pain or pressure at this time.  Has been ongoing for the past several days.  His troponin is mildly elevated.  Will give aspirin.  Will repeat troponin to decide whether he needs heparin at this time.  I suspect this is more likely demand troponin in the setting of worsening heart failure.  Patient will require hospitalization.     The patient was evaluated in Emergency Department today for the symptoms described in the history of present illness. He/she was evaluated in the context of the global COVID-19 pandemic, which necessitated consideration that the patient might be at risk for infection with the SARS-CoV-2 virus that causes COVID-19. Institutional protocols and algorithms that pertain to the evaluation of patients at risk for COVID-19 are in a state of rapid change based on information released by regulatory bodies including the CDC and federal and state organizations. These policies and algorithms were followed during the patient's care in the ED.  As part of my medical decision making, I reviewed the following data within the [facility] notes reviewed and incorporated, Labs reviewed, notes from prior ED visits and [place] Controlled Substance Database   ____________________________________________   FINAL CLINICAL IMPRESSION(S) / ED DIAGNOSES  Final diagnoses:  None      NEW MEDICATIONS STARTED DURING THIS VISIT:  New Prescriptions   No medications on file     Note:  This document was prepared using Dragon voice recognition software and may include unintentional dictation errors.    Merlyn Lot, MD 02/27/20 [phone]

## 2020-02-27 NOTE — ED Notes (Signed)
Date and time results received: 02/27/20 12:25 PM   Test: Troponin Critical Value: 112  Name of Provider Notified: Mayford Knife, MD

## 2020-02-27 NOTE — ED Notes (Addendum)
Dr. Clyde Lundborg notified via secure chat of holding BP medications d/t systolic BP 100's. No new orders at this time

## 2020-02-27 NOTE — Progress Notes (Signed)
PHARMACIST - PHYSICIAN COMMUNICATION  CONCERNING:  Enoxaparin (Lovenox) for DVT Prophylaxis    RECOMMENDATION: Patient was prescribed enoxaprin 40mg  q24 hours for VTE prophylaxis.   Filed Weights   02/27/20 1142  Weight: (!) 139.7 kg (308 lb)    Body mass index is 41.77 kg/m.  Estimated Creatinine Clearance: 100.7 mL/min (A) (by C-G formula based on SCr of 1.37 mg/dL (H)).   Based on [facility] policy patient is candidate for enoxaparin 40mg  every 12 hour dosing due to BMI being >40.   DESCRIPTION: Pharmacy has adjusted enoxaparin dose per [facility] policy.  Patient is now receiving enoxaparin 40mg  every 12 hours.    , PharmD Clinical Pharmacist  02/27/2020 3:52 PM

## 2020-02-27 NOTE — ED Notes (Signed)
Pt in MRI.

## 2020-02-27 NOTE — H&P (Signed)
History and Physical    Ronald Pratt DJM:[id number] DOB: 1976/07/22 DOA: 02/27/2020  Referring MD/NP/PA:   PCP: Barbette Reichmann, MD   Patient coming from:  The patient is coming from home.  At baseline, pt is independent for most of ADL.        Chief Complaint: Shortness of breath, syncope  HPI: Ronald Pratt is a 44 y.o. male with medical history significant of hypertension, GERD, gout, OSA not on CPAP, dCHF, tobacco abuse, who presents with shortness of breath and syncope.  Patient states that he has been having exertional shortness of breath for more than 5 days, which has been progressively worsening.  The shortness breath is worse at night when laying down.  He also has bilateral lower leg edema and 4 pounds of weight gain. He has dry cough, but denies chest pain, fever or chills. Pt states that he pass out 2 days ago and again passed out yesterday.  He has right upper leg numbness.  Denies unilateral weakness in extremities.  No facial droop or slurred speech.  No hearing loss or vision loss.  ED Course: pt was found to have troponin 112, 111, 95, BNP 224, WBC 11.0, negative COVID-19 PCR, creatinine 1.37, BUN 14, GFR >60, temperature normal, blood pressure 113/77, tachycardia, oxygen saturation 96% on room air.  Chest x-ray negative.  Patient is placed on progressive bed for observation.  Review of Systems:   General: no fevers, chills, no body weight gain, has fatigue HEENT: no blurry vision, hearing changes or sore throat Respiratory: has dyspnea, coughing, no wheezing CV: no chest pain, no palpitations GI: no nausea, vomiting, abdominal pain, diarrhea, constipation GU: no dysuria, burning on urination, increased urinary frequency, hematuria  Ext: has leg edema Neuro:  no vision change or hearing loss. Has right upper leg numbness.  Has syncope Skin: no rash, no skin tear. MSK: No muscle spasm, no deformity, no limitation of range of movement in spin Heme: No easy bruising.    Travel history: No recent long distant travel.  Allergy: No Known Allergies  Past Medical History:  Diagnosis Date  . CHF (congestive heart failure) (HCC)   . Diverticulosis large intestine w/o perforation or abscess w/o bleeding 12/11/2016  . Dysrhythmia    tacchycardia new..  put on metoprolol by dr. Juliann Pares for surgery  . GERD (gastroesophageal reflux disease)   . Gout   . Hypertension   . Obstructive sleep apnea   . 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  [name] Umbilical hernia     Past Surgical History:  Procedure Laterality Date  . ESOPHAGOGASTRODUODENOSCOPY ENDOSCOPY  2014  . INSERTION OF MESH N/A 01/04/2017   Procedure: INSERTION OF MESH;  Surgeon: Henrene Dodge, MD;  Location: [facility] ORS;  Service: General;  Laterality: N/A;  . UMBILICAL HERNIA REPAIR N/A 01/04/2017   Procedure: HERNIA REPAIR UMBILICAL ADULT with mesh;  Surgeon: Henrene Dodge, MD;  Location: [facility] ORS;  Service: General;  Laterality: N/A;    Social History:  reports that he has been smoking cigarettes. He has a 20.00 pack-year smoking history. He has never used smokeless tobacco. He reports that he does not drink alcohol or use drugs.  Family History:  Family History  Problem Relation Age of Onset  . Breast cancer Mother   . Heart disease Mother   . Prostate cancer Father   . Colon cancer Father   . Diabetes Sister   . Heart disease Sister  Prior to Admission medications   Medication Sig Start Date End Date Taking? Authorizing Provider  allopurinol (ZYLOPRIM) 100 MG tablet Take 100 mg by mouth daily.    [provider]  indomethacin (INDOCIN) 25 MG capsule Take 25 mg by mouth 2 (two) times daily as needed.    [provider]  losartan (COZAAR) 100 MG tablet Take 1 tablet (100 mg total) by mouth daily. 02/04/18   Alford Highland, MD  metoprolol succinate (TOPROL-XL) 200 MG 24 hr tablet Take 1 tablet (200 mg total) by mouth every morning.  Take with or immediately following a meal. 08/14/18   Clarisa Kindred A, FNP  potassium chloride SA (K-DUR,KLOR-CON) 20 MEQ tablet Take 1 tablet (20 mEq total) by mouth daily. 08/14/18   Delma Freeze, FNP  torsemide (DEMADEX) 20 MG tablet Take 2 tablets (40 mg total) by mouth daily. 08/14/18   Delma Freeze, FNP    Physical Exam: Vitals:   02/27/20 1141 02/27/20 1142 02/27/20 1625  BP: 113/77  104/78  Pulse: (!) 103  99  Resp: 20    Temp: 97.8 F (36.6 C)    TempSrc: Oral    SpO2: 96%    Weight:  (!) 139.7 kg   Height:  6' (1.829 m)    General: Not in acute distress HEENT:       Eyes: PERRL, EOMI, no scleral icterus.       ENT: No discharge from the ears and nose, no pharynx injection, no tonsillar enlargement.        Neck: Difficult to assess JVD due to obesity, no bruit, no mass felt. Heme: No neck lymph node enlargement. Cardiac: S1/S2, RRR, No murmurs, No gallops or rubs. Respiratory: No rales, wheezing, rhonchi or rubs. GI: Soft, nondistended, nontender, no rebound pain, no organomegaly, BS present. GU: No hematuria Ext: 2+pitting leg edema bilaterally. 2+DP/PT pulse bilaterally. Musculoskeletal: No joint deformities, No joint redness or warmth, no limitation of ROM in spin. Skin: No rashes.  Neuro: Alert, oriented X3, cranial nerves II-XII grossly intact, moves all extremities normally. Muscle strength 5/5 in all extremities, sensation to light touch intact. Psych: Patient is not psychotic, no suicidal or hemocidal ideation.  Labs on Admission: I have personally reviewed following labs and imaging studies  CBC: Recent Labs  Lab 02/27/20 1136  WBC 11.0*  HGB 15.8  HCT 47.4  MCV 96.1  PLT 228   Basic Metabolic Panel: Recent Labs  Lab 02/27/20 1136  NA 140  K 3.8  CL 104  CO2 27  GLUCOSE 125*  BUN 14  CREATININE 1.37*  CALCIUM 9.1   GFR: Estimated Creatinine Clearance: 100.7 mL/min (A) (by C-G formula based on SCr of 1.37 mg/dL (H)). Liver Function  Tests: No results for input(s): AST, ALT, ALKPHOS, BILITOT, PROT, ALBUMIN in the last 168 hours. No results for input(s): LIPASE, AMYLASE in the last 168 hours. No results for input(s): AMMONIA in the last 168 hours. Coagulation Profile: No results for input(s): INR, PROTIME in the last 168 hours. Cardiac Enzymes: No results for input(s): CKTOTAL, CKMB, CKMBINDEX, TROPONINI in the last 168 hours. BNP (last 3 results) No results for input(s): PROBNP in the last 8760 hours. HbA1C: No results for input(s): HGBA1C in the last 72 hours. CBG: No results for input(s): GLUCAP in the last 168 hours. Lipid Profile: No results for input(s): CHOL, HDL, LDLCALC, TRIG, CHOLHDL, LDLDIRECT in the last 72 hours. Thyroid Function Tests: No results for input(s): TSH, T4TOTAL, FREET4, T3FREE, THYROIDAB in  the last 72 hours. Anemia Panel: No results for input(s): VITAMINB12, FOLATE, FERRITIN, TIBC, IRON, RETICCTPCT in the last 72 hours. Urine analysis:    Component Value Date/Time   COLORURINE YELLOW (A) 12/11/2016 0633   APPEARANCEUR CLEAR (A) 12/11/2016 0633   LABSPEC 1.015 12/11/2016 0633   PHURINE 6.0 12/11/2016 0633   GLUCOSEU NEGATIVE 12/11/2016 0633   HGBUR NEGATIVE 12/11/2016 0633   BILIRUBINUR NEGATIVE 12/11/2016 0633   KETONESUR NEGATIVE 12/11/2016 0633   PROTEINUR NEGATIVE 12/11/2016 0633   NITRITE NEGATIVE 12/11/2016 0633   LEUKOCYTESUR NEGATIVE 12/11/2016 0633   Sepsis Labs: @LABRCNTIP (procalcitonin:4,lacticidven:4) ) Recent Results (from the past 240 hour(s))  Respiratory Panel by RT PCR (Flu A&B, Covid) - Nasopharyngeal Swab     Status: None   Collection Time: 02/27/20  1:00 PM   Specimen: Nasopharyngeal Swab  Result Value Ref Range Status   SARS Coronavirus 2 by RT PCR NEGATIVE NEGATIVE Final    Comment: (NOTE) SARS-CoV-2 target nucleic acids are NOT DETECTED. The SARS-CoV-2 RNA is generally detectable in upper respiratoy specimens during the acute phase of infection. The  lowest concentration of SARS-CoV-2 viral copies this assay can detect is 131 copies/mL. A negative result does not preclude SARS-Cov-2 infection and should not be used as the sole basis for treatment or other patient management decisions. A negative result may occur with  improper specimen collection/handling, submission of specimen other than nasopharyngeal swab, presence of viral mutation(s) within the areas targeted by this assay, and inadequate number of viral copies (<131 copies/mL). A negative result must be combined with clinical observations, patient history, and epidemiological information. The expected result is Negative. Fact Sheet for Patients:  04/28/20 Fact Sheet for Healthcare Providers:  https://www.moore.com/ This test is not yet ap proved or cleared by the https://www.young.biz/ FDA and  has been authorized for detection and/or diagnosis of SARS-CoV-2 by FDA under an Emergency Use Authorization (EUA). This EUA will remain  in effect (meaning this test can be used) for the duration of the COVID-19 declaration under Section 564(b)(1) of the Act, 21 U.S.C. section 360bbb-3(b)(1), unless the authorization is terminated or revoked sooner.    Influenza A by PCR NEGATIVE NEGATIVE Final   Influenza B by PCR NEGATIVE NEGATIVE Final    Comment: (NOTE) The Xpert Xpress SARS-CoV-2/FLU/RSV assay is intended as an aid in  the diagnosis of influenza from Nasopharyngeal swab specimens and  should not be used as a sole basis for treatment. Nasal washings and  aspirates are unacceptable for Xpert Xpress SARS-CoV-2/FLU/RSV  testing. Fact Sheet for Patients: [place] Fact Sheet for Healthcare Providers: https://www.moore.com/ This test is not yet approved or cleared by the https://www.young.biz/ FDA and  has been authorized for detection and/or diagnosis of SARS-CoV-2 by  FDA under an Emergency  Use Authorization (EUA). This EUA will remain  in effect (meaning this test can be used) for the duration of the  Covid-19 declaration under Section 564(b)(1) of the Act, 21  U.S.C. section 360bbb-3(b)(1), unless the authorization is  terminated or revoked. Performed at [facility], [address] [place], [place]      Radiological Exams on Admission: DG Chest 2 View  Result Date: 02/27/2020 CLINICAL DATA:  Shortness of breath with exertion and increased bilateral leg swelling. EXAM: CHEST - 2 VIEW COMPARISON:  02/02/2018 FINDINGS: The cardiac silhouette, mediastinal and hilar contours are within normal limits and stable. The lungs are clear. No pulmonary edema, pleural effusions or pulmonary lesions. The bony thorax is intact. IMPRESSION:  No acute cardiopulmonary findings. Electronically Signed   By: Marijo Sanes M.D.   On: 02/27/2020 12:11   MR BRAIN WO CONTRAST  Result Date: 02/27/2020 CLINICAL DATA:  Syncope EXAM: MRI HEAD WITHOUT CONTRAST TECHNIQUE: Multiplanar, multiecho pulse sequences of the brain and surrounding structures were obtained without intravenous contrast. COMPARISON:  Head CT December 11 2016 FINDINGS: Brain: No acute infarction, hemorrhage, hydrocephalus, extra-axial collection or mass lesion. Rare foci of T2 hyperintensity are seen within the white matter of the cerebral hemispheres, nonspecific. Vascular: Normal flow voids. Skull and upper cervical spine: Normal marrow signal. Sinuses/Orbits: Negative. IMPRESSION: 1. No acute intracranial abnormality. 2. Rare foci of T2 hyperintensity within the white matter of the cerebral hemispheres are nonspecific and may be seen with migraine headache or sequelae of prior infectious/inflammatory process. Electronically Signed   By: Pedro Earls M.D.   On: 02/27/2020 16:24     EKG: Independently reviewed.  Sinus rhythm, QTC 612, LAE, ST elevation in V3?  Assessment/Plan Principal Problem:    Acute on chronic diastolic CHF (congestive heart failure) (HCC) Active Problems:   Benign essential hypertension   Gout   Tobacco use   Syncope   Elevated troponin   Acute on chronic diastolic CHF and elevated trop: Patient has bilateral leg edema, shortness of breath, elevated BNP 224, clinically consistent with CHF exacerbation.  2D echo on 02/03/2018 showed EF of 55 to 60% with grade 1 diastolic dysfunction.  Patient has elevated troponin, 112, 111, 95, but no chest pain.  Likely due to demand ischemia.  Cardiology is consulted.  -Place to progressive unit for observation -Lasix 40 mg bid by IV (patient received 60 mg of Lasix by IV in ED) -trend trop -Check A1c, FLP -Aspirin -2d echo -Daily weights -strict I/O's -Low salt diet -Fluid restriction -Obtain REDs Vest reading  Benign essential hypertension -Cozaar, metoprolol, -Patient is on IV Lasix -IV hydralazine as needed  Gout -Continue allopurinol  Tobacco use -Nicotine patch  Syncope: Etiology is not clear.  Patient complains of right upper leg numbness, but no unilateral weakness in extremities.  No other focal neuro deficit on physical examination. -will get MRI of brain --> negative for acute intracranial abnormalities -Follow-up 2D echo   Status is: Observation The patient remains OBS appropriate and will d/c before 2 midnights. Dispo: The patient is from: Home              Anticipated d/c is to: Home              Anticipated d/c date is: 1 day              Patient currently is not medically stable to d/c.      DVT ppx:  SQ Lovenox Code Status: Full code Family Communication: not done, no family member is at bed side.   Disposition Plan:  Anticipate discharge back to previous home environment Consults called: Cardiology Admission status:  progressive unit for obs     .patient     Date of Service 02/27/2020    Ivor Costa [facility]   If 7PM-7AM, please contact  night-coverage [email or web address] 02/27/2020, 4:43 PM

## 2020-02-27 NOTE — ED Notes (Signed)
Pt given ice water as requested. Family at bedside. Call bell within reach

## 2020-02-27 NOTE — Consult Note (Signed)
CARDIOLOGY CONSULT NOTE               Patient ID: Ronald Pratt MRN: [id number] DOB/AGE: October 17, 1976 44 y.o.  Admit date: 02/27/2020 Referring Physician Clyde Lundborg, MD Primary Physician Marcello Fennel, MD Primary Cardiologist [facility], MD Reason for Consultation acute on chronic diastolic CHF  HPI: 44 year old male referred for evaluation of acute on chronic diastolic CHF. The patient has a history of obesity, OSA, not on CPAP, hypertension, and tobacco abuse. The patient was seen by his primary care provider today for concerns of a 4 day history of worsening shortness of breath, peripheral edema, 4 pound overnight weight gain, orthopnea, dry cough, and two syncopal episodes, one that occurred after standing up quickly, and the other after a coughing fit. He was advised to come to the ER for management. The patient has been taking his medications as prescribed, including torsemide 20 mg daily, but states that in the last 2 weeks, he has had minimal urine output. He states he limits his salt intake. He drinks 4 to 5 32-oz jugs of water daily as he works in a hot environment. ECG on arrival revealed sinus rhythm at a rate of 101 bpm with non-diagnostic ST-T wave abnormalities with QTc 612. The patient denies chest pain, and states that he feels "great," and feels well enough to leave except for his exertional dyspnea.  Admission labs are notable for high-sensitivity troponin 112 and 111, BNP 224 sodium 140, potassium 3.8, BUN 14, creatinine 1.37.  Chest x-ray is clear without evidence of pulmonary edema or effusions. 2D echocardiogram 01/2018 revealed LVEF 55-60%.  Review of systems complete and found to be negative unless listed above     Past Medical History:  Diagnosis Date  . CHF (congestive heart failure) (HCC)   . Diverticulosis large intestine w/o perforation or abscess w/o bleeding 12/11/2016  . Dysrhythmia    tacchycardia new..  put on metoprolol by dr. Juliann Pares for surgery  . GERD (gastroesophageal  reflux disease)   . Gout   . Hypertension   . Obstructive sleep apnea   . 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  [name] Umbilical hernia     Past Surgical History:  Procedure Laterality Date  . ESOPHAGOGASTRODUODENOSCOPY ENDOSCOPY  2014  . INSERTION OF MESH N/A 01/04/2017   Procedure: INSERTION OF MESH;  Surgeon: Henrene Dodge, MD;  Location: [facility] ORS;  Service: General;  Laterality: N/A;  . UMBILICAL HERNIA REPAIR N/A 01/04/2017   Procedure: HERNIA REPAIR UMBILICAL ADULT with mesh;  Surgeon: Henrene Dodge, MD;  Location: [facility] ORS;  Service: General;  Laterality: N/A;    (Not in a hospital admission)  Social History   Socioeconomic History  . Marital status: Divorced    Spouse name: Not on file  . Number of children: Not on file  . Years of education: Not on file  . Highest education level: Not on file  Occupational History  . Not on file  Tobacco Use  . Smoking status: Current Every Day Smoker    Packs/day: 1.00    Years: 20.00    Pack years: 20.00    Types: Cigarettes  . Smokeless tobacco: Never Used  Substance and Sexual Activity  . Alcohol use: No    Comment: SOBER SINCE 2010  . Drug use: No  . Sexual activity: Yes    Birth control/protection: None  Other Topics Concern  . Not on file  Social History Narrative  . Not on  file   Social Determinants of Health   Financial Resource Strain:   . Difficulty of Paying Living Expenses:   Food Insecurity:   . Worried About Programme researcher, broadcasting/film/video in the Last Year:   . Barista in the Last Year:   Transportation Pratt:   . Freight forwarder (Medical):   [name] Lack of Transportation (Non-Medical):   Physical Activity:   . Days of Exercise per Week:   . Minutes of Exercise per Session:   Stress:   . Feeling of Stress :   Social Connections:   . Frequency of Communication with Friends and Family:   . Frequency of Social Gatherings with Friends and Family:   . Attends  Religious Services:   . Active Member of Clubs or Organizations:   . Attends Banker Meetings:   [name] Marital Status:   Intimate Partner Violence:   . Fear of Current or Ex-Partner:   . Emotionally Abused:   [name] Physically Abused:   . Sexually Abused:     Family History  Problem Relation Age of Onset  . Breast cancer Mother   . Heart disease Mother   . Prostate cancer Father   . Colon cancer Father   . Diabetes Sister   . Heart disease Sister       Review of systems complete and found to be negative unless listed above      PHYSICAL EXAM  General: Well developed, well nourished, in no acute distress, sitting in chair next to bed, appears comfortable, laughing and joking HEENT:  Normocephalic and atramatic Neck:  No JVD.  Lungs: Clear bilaterally to auscultation, normal effort of breathing on room air Heart: HRRR . Normal S1 and S2 without gallops or murmurs.  Abdomen: obese Msk:  Back normal, gait not assessed. Strength and tone appear normal for age Extremities: 1-2+ bilateral lower extremity edema.   Neuro: Alert and oriented X 3. Psych:  Good affect, responds appropriately  Labs:   Lab Results  Component Value Date   WBC 11.0 (H) 02/27/2020   HGB 15.8 02/27/2020   HCT 47.4 02/27/2020   MCV 96.1 02/27/2020   PLT 228 02/27/2020    Recent Labs  Lab 02/27/20 1136  NA 140  K 3.8  CL 104  CO2 27  BUN 14  CREATININE 1.37*  CALCIUM 9.1  GLUCOSE 125*   Lab Results  Component Value Date   TROPONINI <0.03 02/03/2018   No results found for: CHOL No results found for: HDL No results found for: LDLCALC No results found for: TRIG No results found for: CHOLHDL No results found for: LDLDIRECT    Radiology: DG Chest 2 View  Result Date: 02/27/2020 CLINICAL DATA:  Shortness of breath with exertion and increased bilateral leg swelling. EXAM: CHEST - 2 VIEW COMPARISON:  02/02/2018 FINDINGS: The cardiac silhouette, mediastinal and hilar contours are within  normal limits and stable. The lungs are clear. No pulmonary edema, pleural effusions or pulmonary lesions. The bony thorax is intact. IMPRESSION: No acute cardiopulmonary findings. Electronically Signed   By: Rudie Meyer M.D.   On: 02/27/2020 12:11    EKG: sinus rhythm 101 bpm with nondiagnostic ST-T wave abnormalities with QTc 612  ASSESSMENT AND PLAN:  1. Acute on chronic diastolic CHF, presenting as a several day history of orthopnea, weight gain, progressive dyspnea on exertion, and peripheral edema, refractory to oral torsemide. Patient reports compliance with medications and low salt diet, but does drink about 128-160  ounces of water daily. BNP 224. Chest xray clear. 2D echocardiogram in 2019 revealed normal left ventricular function.   2. Syncope, one episode occurred after standing up quickly, and the other after a coughing fit.  3. Elevated troponin, 112 and 111, likely secondary to demand supply ischemia. Patient has non-diagnostic ECG changes in the absence of chest pain.  4. Hypertension, well controlled at this time. 5. Obesity 6. Tobacco abuse, smokes 1/2-1 ppd  Recommendations: 1. Agree with current therapy 2. IV Lasix 40 mg BID 3. Metoprolol succinate 25 mg daily; uptitrate as needed for blood pressure control 4. 2D echocardiogram 5. Monitor renal status, electrolytes, daily weight, and I&Os  6. Defer heparin at this time in the absence of chest pain with down-trending troponin. 7. Encouraged patient to limit fluid intake to 60 ounces daily 8. Encouraged patient to stop smoking 9. Will plan to have patient follow-up with heart failure clinic after discharge 10. Further recommendations pending patient's initial course and echocardiogram.  Signed: Clabe Seal PA-C 02/27/2020, 1:35 PM

## 2020-02-27 NOTE — ED Notes (Signed)
Dr Robinson at bedside 

## 2020-02-27 NOTE — ED Notes (Addendum)
Dinner tray at bedside.  Denies any needs at this time.

## 2020-02-28 DIAGNOSIS — I5033 Acute on chronic diastolic (congestive) heart failure: Secondary | ICD-10-CM | POA: Diagnosis not present

## 2020-02-28 LAB — ECHOCARDIOGRAM COMPLETE
Height: 72 in
Weight: 4928 oz

## 2020-02-28 LAB — BASIC METABOLIC PANEL
Anion gap: 9 (ref 5–15)
BUN: 14 mg/dL (ref 6–20)
CO2: 28 mmol/L (ref 22–32)
Calcium: 9 mg/dL (ref 8.9–10.3)
Chloride: 103 mmol/L (ref 98–111)
Creatinine, Ser: 1.29 mg/dL — ABNORMAL HIGH (ref 0.61–1.24)
GFR calc Af Amer: >60 mL/min (ref >60)
GFR calc non Af Amer: >60 mL/min (ref >60)
Glucose, Bld: 113 mg/dL — ABNORMAL HIGH (ref 70–99)
Potassium: 3.4 mmol/L — ABNORMAL LOW (ref 3.5–5.1)
Sodium: 140 mmol/L (ref 135–145)

## 2020-02-28 LAB — LIPID PANEL
Cholesterol: 170 mg/dL (ref 0–200)
HDL: 41 mg/dL (ref >40)
LDL Cholesterol: 95 mg/dL (ref 0–99)
Total CHOL/HDL Ratio: 4.1 RATIO
Triglycerides: 172 mg/dL — ABNORMAL HIGH (ref <150)
VLDL: 34 mg/dL (ref 0–40)

## 2020-02-28 LAB — HEMOGLOBIN A1C
Hgb A1c MFr Bld: 6.4 % — ABNORMAL HIGH (ref 4.8–5.6)
Mean Plasma Glucose: 136.98 mg/dL

## 2020-02-28 MED ORDER — TORSEMIDE 20 MG PO TABS: 40.0000 mg | ORAL_TABLET | Freq: Every day | ORAL | 1 refills | Status: DC

## 2020-02-28 MED ORDER — ENOXAPARIN SODIUM 40 MG/0.4ML ~~LOC~~ SOLN: 40.0000 mg | SUBCUTANEOUS | Status: DC

## 2020-02-28 MED ORDER — POTASSIUM CHLORIDE CRYS ER 20 MEQ PO TBCR
40.0000 meq | EXTENDED_RELEASE_TABLET | Freq: Once | ORAL | Status: AC
  Administered 2020-02-28: 40 meq via ORAL
  Filled 2020-02-28: qty 2

## 2020-02-28 MED ORDER — ASPIRIN 81 MG PO TBEC: 81.0000 mg | DELAYED_RELEASE_TABLET | Freq: Every day | ORAL | 1 refills | Status: DC

## 2020-02-28 MED ORDER — METOPROLOL SUCCINATE ER 25 MG PO TB24: 25.0000 mg | ORAL_TABLET | Freq: Every day | ORAL | 0 refills | Status: DC

## 2020-02-28 NOTE — Plan of Care (Signed)
Patient stable no complaints of shortness of breath nor chest pain. Patient concern about fluid returning once he gets home. MD at bedside and spoke with patient about increasing diuretic at home.  No distress noted. Continue to monitor.    Problem: Education: Goal: Ability to demonstrate management of disease process will improve Outcome: Progressing Goal: Ability to verbalize understanding of medication therapies will improve Outcome: Progressing   Problem: Activity: Goal: Capacity to carry out activities will improve Outcome: Progressing   Problem: Cardiac: Goal: Ability to achieve and maintain adequate cardiopulmonary perfusion will improve Outcome: Progressing

## 2020-02-28 NOTE — Progress Notes (Signed)
[facility] Cardiology    SUBJECTIVE: The patient reports feeling great this morning. He reports improvement in his breathing, is able to lie more comfortably in bed, whereas before, he had to sit upright, and denies chest pain. He has ambulated around his room and reports decreased exertional dyspnea.   Vitals:   02/27/20 2215 02/27/20 2233 02/28/20 0336 02/28/20 0714  BP: 114/80 (!) 120/93 (!) 126/92 132/85  Pulse: (!) 102 83 (!) 104 99  Resp: 16   17  Temp:  99.1 F (37.3 C) 98.4 F (36.9 C) 98.6 F (37 C)  TempSrc:  Oral Oral Oral  SpO2:  98% 95% 98%  Weight:  135.4 kg 135.1 kg   Height:  6\' 2"  (1.88 m)       Intake/Output Summary (Last 24 hours) at 02/28/2020 0858 Last data filed at 02/28/2020 9371 Gross per 24 hour  Intake --  Output 1900 ml  Net -1900 ml      PHYSICAL EXAM  General: Well developed, well nourished, in no acute distress, smiling, laughing HEENT:  Normocephalic and atramatic Neck:  No JVD.  Lungs: Clear bilaterally to auscultation, normal effort of breathing on room air. Heart: HRRR . Normal S1 and S2 without gallops or murmurs.  Abdomen: obese, soft Msk:  Back normal. Normal strength and tone for age. Extremities: No clubbing, cyanosis. With 1+ bilateral lower extremity edema.   Neuro: Alert and oriented X 3. Psych:  Good affect, responds appropriately   LABS: Basic Metabolic Panel: Recent Labs    02/27/20 1136 02/28/20 0514  NA 140 140  K 3.8 3.4*  CL 104 103  CO2 27 28  GLUCOSE 125* 113*  BUN 14 14  CREATININE 1.37* 1.29*  CALCIUM 9.1 9.0   Liver Function Tests: No results for input(s): AST, ALT, ALKPHOS, BILITOT, PROT, ALBUMIN in the last 72 hours. No results for input(s): LIPASE, AMYLASE in the last 72 hours. CBC: Recent Labs    02/27/20 1136  WBC 11.0*  HGB 15.8  HCT 47.4  MCV 96.1  PLT 228   Cardiac Enzymes: No results for input(s): CKTOTAL, CKMB, CKMBINDEX, TROPONINI in the last 72 hours. BNP: Invalid input(s):  POCBNP D-Dimer: No results for input(s): DDIMER in the last 72 hours. Hemoglobin A1C: No results for input(s): HGBA1C in the last 72 hours. Fasting Lipid Panel: Recent Labs    02/28/20 0514  CHOL 170  HDL 41  LDLCALC 95  TRIG 172*  CHOLHDL 4.1   Thyroid Function Tests: No results for input(s): TSH, T4TOTAL, T3FREE, THYROIDAB in the last 72 hours.  Invalid input(s): FREET3 Anemia Panel: No results for input(s): VITAMINB12, FOLATE, FERRITIN, TIBC, IRON, RETICCTPCT in the last 72 hours.  DG Chest 2 View  Result Date: 02/27/2020 CLINICAL DATA:  Shortness of breath with exertion and increased bilateral leg swelling. EXAM: CHEST - 2 VIEW COMPARISON:  02/02/2018 FINDINGS: The cardiac silhouette, mediastinal and hilar contours are within normal limits and stable. The lungs are clear. No pulmonary edema, pleural effusions or pulmonary lesions. The bony thorax is intact. IMPRESSION: No acute cardiopulmonary findings. Electronically Signed   By: Marijo Sanes M.D.   On: 02/27/2020 12:11   MR BRAIN WO CONTRAST  Result Date: 02/27/2020 CLINICAL DATA:  Syncope EXAM: MRI HEAD WITHOUT CONTRAST TECHNIQUE: Multiplanar, multiecho pulse sequences of the brain and surrounding structures were obtained without intravenous contrast. COMPARISON:  Head CT December 11 2016 FINDINGS: Brain: No acute infarction, hemorrhage, hydrocephalus, extra-axial collection or mass lesion. Rare foci of T2 hyperintensity  are seen within the white matter of the cerebral hemispheres, nonspecific. Vascular: Normal flow voids. Skull and upper cervical spine: Normal marrow signal. Sinuses/Orbits: Negative. IMPRESSION: 1. No acute intracranial abnormality. 2. Rare foci of T2 hyperintensity within the white matter of the cerebral hemispheres are nonspecific and may be seen with migraine headache or sequelae of prior infectious/inflammatory process. Electronically Signed   By: Baldemar Lenis M.D.   On: 02/27/2020 16:24      Echo Pending  TELEMETRY: sinus rhythm, 78 bpm  ASSESSMENT AND PLAN:  Principal Problem:   Acute on chronic diastolic CHF (congestive heart failure) (HCC) Active Problems:   Benign essential hypertension   Gout   Tobacco use   Syncope   Elevated troponin   1. Acute on chronic diastolic CHF, presenting as a several day history of orthopnea, weight gain, progressive dyspnea on exertion, and peripheral edema, refractory to oral torsemide. Patient reports compliance with medications and low salt diet, but does drink about 128-160 ounces of water daily. BNP 224. Chest xray clear. 2D echocardiogram in 2019 revealed normal left ventricular function. Patient reports clinical improvement with IV Lasix with approximate 4 pound weight loss.  2. Syncope, one episode occurred after standing up quickly, and the other after a coughing fit.  3. Elevated troponin, 112, 111, 95, 81, 62,  likely secondary to demand supply ischemia. Patient has non-diagnostic ECG changes in the absence of chest pain.  4. Hypertension, well controlled at this time. 5. Obesity 6. Tobacco abuse, smokes 1/2-1 ppd  Recommendations: 1. Continue IV Lasix 40 mg BID today, likely transitioning to PO in AM 2. Continue to carefully monitor renal status, I&Os, and daily weight 3. Follow-up with Heart Failure clinic as outpatient 4. Discussed limiting fluid and sodium intake and encouraged weight loss and smoking cessation with the patient 5. Continue losartan 100 mg and metoprolol succinate 25 mg daily 6. Review 2D echocardiogram 7. Supplement potassium as needed 8. Anticipate discharge tomorrow with follow-up with Dr. Juliann Pares as outpatient 9. No further cardiac diagnostics recommended at this time.  Signing off to Dr. Gwen Pounds who is on-call today.  Leanora Ivanoff, PA-C 02/28/2020 8:58 AM

## 2020-02-28 NOTE — Progress Notes (Signed)
Patient notifies this nurse of increasing pain to IV site. Site appears infiltrated. IV removed. Catheter intact, site clean and dry. Patient tolerates well. IV team consult placed for new site. Will continue to  monitor to ensure comfort and safety.

## 2020-02-28 NOTE — Discharge Summary (Signed)
Physician Discharge Summary  DETAVIOUS Pratt JJK:[id number] DOB: 25-May-1976 DOA: 02/27/2020  PCP: Tracie Harrier, MD  Admit date: 02/27/2020 Discharge date: 02/28/2020  Admitted From: Home Disposition: Home  Recommendations for Outpatient Follow-up:  1. Follow up with PCP in 1-2 weeks 2. Follow-up with cardiology 3. Please obtain BMP/CBC in one week 4. Please follow up on the following pending results: None  Home Health: No Equipment/Devices: None Discharge Condition: Stable CODE STATUS: Full Diet recommendation: Heart Healthy   Brief/Interim Summary: Ronald Pratt is a 44 y.o. male with medical history significant of hypertension, GERD, gout, OSA not on CPAP, dCHF, tobacco abuse, who presents with shortness of breath and syncope.  Patient states that he has been having exertional shortness of breath for more than 5 days, which has been progressively worsening.  The shortness breath is worse at night when laying down.  He also has bilateral lower leg edema and 4 pounds of weight gain. He has dry cough, but denies chest pain, fever or chills. Pt states that he pass out 2 days ago and again passed out yesterday.  He has right upper leg numbness.  Denies unilateral weakness in extremities.  No facial droop or slurred speech.  No hearing loss or vision loss. On presentation he was hemodynamically stable, mildly elevated troponin with downward trend which was thought to be secondary to demand.  He had 2+ lower extremity edema and was admitted for acute on chronic diastolic heart failure. Repeat echogram shows EF of 45 to 50% which was mildly reduced as compared to prior with no regional wall motion abnormalities or diastolic dysfunction noted.  Weight down to 297 pound with IV Lasix.  Cardiology was consulted and he will follow-up with them as an outpatient. His home dose of torsemide was increased to 40 mg daily. Metoprolol dose was decreased to 25 mg twice daily.  MRI brain was negative for any  intracranial abnormalities.  He will continue rest of his home meds.  Discharge Diagnoses:  Principal Problem:   Acute on chronic diastolic CHF (congestive heart failure) (HCC) Active Problems:   Benign essential hypertension   Gout   Tobacco use   Syncope   Elevated troponin  Discharge Instructions  Discharge Instructions    (HEART FAILURE PATIENTS) Call MD:  Anytime you have any of the following symptoms: 1) 3 pound weight gain in 24 hours or 5 pounds in 1 week 2) shortness of breath, with or without a dry hacking cough 3) swelling in the hands, feet or stomach 4) if you have to sleep on extra pillows at night in order to breathe.   Complete by: As directed    Diet - low sodium heart healthy   Complete by: As directed    Discharge instructions   Complete by: As directed    It was pleasure taking care of you. We increased the dose of torsemide from 20 to 40 mg daily. It is important that you quit smoking as it will increase your risk of heart disease. Please follow-up with cardiologist in 1 week for further evaluation and management.   Increase activity slowly   Complete by: As directed      Allergies as of 02/28/2020   No Known Allergies     Medication List    TAKE these medications   allopurinol 300 MG tablet Commonly known as: ZYLOPRIM Take 300 mg by mouth daily.   aspirin 81 MG EC tablet Take 1 tablet (81 mg total) by mouth daily. Start  taking on: Feb 29, 2020   colchicine 0.6 MG tablet Take 0.6-1.2 mg by mouth See admin instructions. Take 2 tablets (1.2mg ) by mouth at onset of gout flare - take 1 additional tablet (0.6mg ) by mouth after an hour if needed   losartan 100 MG tablet Commonly known as: COZAAR Take 1 tablet (100 mg total) by mouth daily.   metoprolol succinate 25 MG 24 hr tablet Commonly known as: TOPROL-XL Take 1 tablet (25 mg total) by mouth daily. Start taking on: Feb 29, 2020 What changed:   medication strength  how much to take    omeprazole 20 MG tablet Commonly known as: PRILOSEC OTC Take 20 mg by mouth daily.   potassium chloride SA 20 MEQ tablet Commonly known as: KLOR-CON Take 1 tablet (20 mEq total) by mouth daily.   torsemide 20 MG tablet Commonly known as: DEMADEX Take 2 tablets (40 mg total) by mouth daily. What changed: how much to take      Follow-up Information    [facility] Follow up on 03/06/2020.   Specialty: Cardiology Why: at 11:30am. Enter through the Medical Mall entrance  Contact information: [address] [place] [id number] [phone]       Barbette ReichmannHande, Vishwanath, MD. Schedule an appointment as soon as possible for a visit.   Specialty: Internal Medicine Contact information: [address] [facility] [place] [id number] [phone]          No Known Allergies  Consultations:  Cardiology  Procedures/Studies: DG Chest 2 View  Result Date: 02/27/2020 CLINICAL DATA:  Shortness of breath with exertion and increased bilateral leg swelling. EXAM: CHEST - 2 VIEW COMPARISON:  02/02/2018 FINDINGS: The cardiac silhouette, mediastinal and hilar contours are within normal limits and stable. The lungs are clear. No pulmonary edema, pleural effusions or pulmonary lesions. The bony thorax is intact. IMPRESSION: No acute cardiopulmonary findings. Electronically Signed   By: Rudie MeyerP.  Gallerani M.D.   On: 02/27/2020 12:11   MR BRAIN WO CONTRAST  Result Date: 02/27/2020 CLINICAL DATA:  Syncope EXAM: MRI HEAD WITHOUT CONTRAST TECHNIQUE: Multiplanar, multiecho pulse sequences of the brain and surrounding structures were obtained without intravenous contrast. COMPARISON:  Head CT December 11 2016 FINDINGS: Brain: No acute infarction, hemorrhage, hydrocephalus, extra-axial collection or mass lesion. Rare foci of T2 hyperintensity are seen within the white matter of the cerebral hemispheres, nonspecific.  Vascular: Normal flow voids. Skull and upper cervical spine: Normal marrow signal. Sinuses/Orbits: Negative. IMPRESSION: 1. No acute intracranial abnormality. 2. Rare foci of T2 hyperintensity within the white matter of the cerebral hemispheres are nonspecific and may be seen with migraine headache or sequelae of prior infectious/inflammatory process. Electronically Signed   By: Baldemar LenisKatyucia  De Macedo Rodrigues M.D.   On: 02/27/2020 16:24   ECHOCARDIOGRAM COMPLETE  Result Date: 02/28/2020    ECHOCARDIOGRAM REPORT   Patient Name:   Ronald GallusJOHN E Spranger Date of Exam: 02/27/2020 Medical Rec #:  [id number]    Height:       72.0 in Accession #:    [id number]   Weight:       308.0 lb Date of Birth:  06/20/1976     BSA:          2.560 m Patient Age:    43 years     BP:           114/89 mmHg Patient Gender: M  HR:           92 bpm. Exam Location:  [facility] Procedure: 2D Echo, Cardiac Doppler and Color Doppler Indications:     I50.9 Congestive Heart Failure  History:         Patient has prior history of Echocardiogram examinations, most                  recent 02/03/2018. Risk Factors:Hypertension and Obesity.                  Congestive heart failure. Obstructive sleep apnea. Dysrhythmia.  Sonographer:     Sedonia Small Rodgers-Jones Referring Phys:  [id number] YIRSW Ronald Pratt Diagnosing Phys: Arnoldo Hooker MD IMPRESSIONS  1. Left ventricular ejection fraction, by estimation, is 45 to 50%. The left ventricle has mildly decreased function. The left ventricle has no regional wall motion abnormalities. Left ventricular diastolic parameters were normal.  2. Right ventricular systolic function is normal. The right ventricular size is normal. There is mildly elevated pulmonary artery systolic pressure.  3. The mitral valve is normal in structure. Mild mitral valve regurgitation.  4. The aortic valve is normal in structure. Aortic valve regurgitation is not visualized. FINDINGS  Left Ventricle: Left ventricular ejection fraction, by estimation, is 45 to  50%. The left ventricle has mildly decreased function. The left ventricle has no regional wall motion abnormalities. The left ventricular internal cavity size was normal in size. There is no left ventricular hypertrophy. Left ventricular diastolic parameters were normal. Right Ventricle: The right ventricular size is normal. No increase in right ventricular wall thickness. Right ventricular systolic function is normal. There is mildly elevated pulmonary artery systolic pressure. The tricuspid regurgitant velocity is 2.66  m/s, and with an assumed right atrial pressure of 10 mmHg, the estimated right ventricular systolic pressure is 38.3 mmHg. Left Atrium: Left atrial size was normal in size. Right Atrium: Right atrial size was normal in size. Pericardium: There is no evidence of pericardial effusion. Mitral Valve: The mitral valve is normal in structure. Mild mitral valve regurgitation. Tricuspid Valve: The tricuspid valve is normal in structure. Tricuspid valve regurgitation is trivial. Aortic Valve: The aortic valve is normal in structure. Aortic valve regurgitation is not visualized. Pulmonic Valve: The pulmonic valve was normal in structure. Pulmonic valve regurgitation is not visualized. Aorta: The aortic root and ascending aorta are structurally normal, with no evidence of dilitation. IAS/Shunts: No atrial level shunt detected by color flow Doppler.  LEFT VENTRICLE PLAX 2D LVIDd:         4.86 cm  Diastology LVIDs:         3.33 cm  LV e' lateral:   8.16 cm/s LV PW:         1.10 cm  LV E/e' lateral: 4.6 LV IVS:        1.13 cm  LV e' medial:    5.55 cm/s LVOT diam:     2.20 cm  LV E/e' medial:  6.7 LV SV:         37 LV SV Index:   15 LVOT Area:     3.80 cm  RIGHT VENTRICLE RV Basal diam:  5.49 cm LEFT ATRIUM             Index       RIGHT ATRIUM           Index LA diam:        4.40 cm 1.72 cm/m  RA Area:     19.60 cm LA Vol (  A2C):   41.8 ml 16.33 ml/m RA Volume:   65.30 ml  25.51 ml/m LA Vol (A4C):   51.3 ml  20.04 ml/m LA Biplane Vol: 50.2 ml 19.61 ml/m  AORTIC VALVE LVOT Vmax:   66.15 cm/s LVOT Vmean:  48.300 cm/s LVOT VTI:    0.098 m  AORTA Ao Root diam: 3.70 cm MITRAL VALVE               TRICUSPID VALVE MV Area (PHT): 3.77 cm    TR Peak grad:   28.3 mmHg MV Decel Time: 201 msec    TR Vmax:        266.00 cm/s MV E velocity: 37.20 cm/s MV A velocity: 49.50 cm/s  SHUNTS MV E/A ratio:  0.75        Systemic VTI:  0.10 m                            Systemic Diam: 2.20 cm Arnoldo Hooker MD Electronically signed by Arnoldo Hooker MD Signature Date/Time: 02/28/2020/1:39:01 PM    Final      Subjective: Patient was feeling better when seen today.  No orthopnea today.  Mild exertional dyspnea.  Denies any chest pain.  He was accompanied by his sister and mother in the room.  Discharge Exam: Vitals:   02/28/20 0714 02/28/20 1138  BP: 132/85 (!) 128/96  Pulse: 99 91  Resp: 17 18  Temp: 98.6 F (37 C) 97.7 F (36.5 C)  SpO2: 98% 97%   Vitals:   02/27/20 2233 02/28/20 0336 02/28/20 0714 02/28/20 1138  BP: (!) 120/93 (!) 126/92 132/85 (!) 128/96  Pulse: 83 (!) 104 99 91  Resp:   17 18  Temp: 99.1 F (37.3 C) 98.4 F (36.9 C) 98.6 F (37 C) 97.7 F (36.5 C)  TempSrc: Oral Oral Oral Oral  SpO2: 98% 95% 98% 97%  Weight: 135.4 kg 135.1 kg    Height: 6\' 2"  (1.88 m)       General: Pt is alert, awake, not in acute distress Cardiovascular: RRR, S1/S2 +, no rubs, no gallops Respiratory: CTA bilaterally, no wheezing, no rhonchi Abdominal: Soft, NT, ND, bowel sounds + Extremities: 1+ LE edema, no cyanosis   The results of significant diagnostics from this hospitalization (including imaging, microbiology, ancillary and laboratory) are listed below for reference.    Microbiology: Recent Results (from the past 240 hour(s))  Respiratory Panel by RT PCR (Flu A&B, Covid) - Nasopharyngeal Swab     Status: None   Collection Time: 02/27/20  1:00 PM   Specimen: Nasopharyngeal Swab  Result Value Ref Range  Status   SARS Coronavirus 2 by RT PCR NEGATIVE NEGATIVE Final    Comment: (NOTE) SARS-CoV-2 target nucleic acids are NOT DETECTED. The SARS-CoV-2 RNA is generally detectable in upper respiratoy specimens during the acute phase of infection. The lowest concentration of SARS-CoV-2 viral copies this assay can detect is 131 copies/mL. A negative result does not preclude SARS-Cov-2 infection and should not be used as the sole basis for treatment or other patient management decisions. A negative result may occur with  improper specimen collection/handling, submission of specimen other than nasopharyngeal swab, presence of viral mutation(s) within the areas targeted by this assay, and inadequate number of viral copies (<131 copies/mL). A negative result must be combined with clinical observations, patient history, and epidemiological information. The expected result is Negative. Fact Sheet for Patients:  04/28/20 Fact Sheet for Healthcare Providers:  https://www.young.biz/ This test is not yet ap proved or cleared by the [place] and  has been authorized for detection and/or diagnosis of SARS-CoV-2 by FDA under an Emergency Use Authorization (EUA). This EUA will remain  in effect (meaning this test can be used) for the duration of the COVID-19 declaration under Section 564(b)(1) of the Act, 21 U.S.C. section 360bbb-3(b)(1), unless the authorization is terminated or revoked sooner.    Influenza A by PCR NEGATIVE NEGATIVE Final   Influenza B by PCR NEGATIVE NEGATIVE Final    Comment: (NOTE) The Xpert Xpress SARS-CoV-2/FLU/RSV assay is intended as an aid in  the diagnosis of influenza from Nasopharyngeal swab specimens and  should not be used as a sole basis for treatment. Nasal washings and  aspirates are unacceptable for Xpert Xpress SARS-CoV-2/FLU/RSV  testing. Fact Sheet for  Patients: https://www.moore.com/ Fact Sheet for Healthcare Providers: https://www.young.biz/ This test is not yet approved or cleared by the [place] FDA and  has been authorized for detection and/or diagnosis of SARS-CoV-2 by  FDA under an Emergency Use Authorization (EUA). This EUA will remain  in effect (meaning this test can be used) for the duration of the  Covid-19 declaration under Section 564(b)(1) of the Act, 21  U.S.C. section 360bbb-3(b)(1), unless the authorization is  terminated or revoked. Performed at [facility], [address] [place], [place] [address]      Labs: BNP (last 3 results) Recent Labs    02/27/20 1136  BNP 224.0*   Basic Metabolic Panel: Recent Labs  Lab 02/27/20 1136 02/28/20 0514  NA 140 140  K 3.8 3.4*  CL 104 103  CO2 27 28  GLUCOSE 125* 113*  BUN 14 14  CREATININE 1.37* 1.29*  CALCIUM 9.1 9.0   Liver Function Tests: No results for input(s): AST, ALT, ALKPHOS, BILITOT, PROT, ALBUMIN in the last 168 hours. No results for input(s): LIPASE, AMYLASE in the last 168 hours. No results for input(s): AMMONIA in the last 168 hours. CBC: Recent Labs  Lab 02/27/20 1136  WBC 11.0*  HGB 15.8  HCT 47.4  MCV 96.1  PLT 228   Cardiac Enzymes: No results for input(s): CKTOTAL, CKMB, CKMBINDEX, TROPONINI in the last 168 hours. BNP: Invalid input(s): POCBNP CBG: No results for input(s): GLUCAP in the last 168 hours. D-Dimer No results for input(s): DDIMER in the last 72 hours. Hgb A1c Recent Labs    02/28/20 0514  HGBA1C 6.4*   Lipid Profile Recent Labs    02/28/20 0514  CHOL 170  HDL 41  LDLCALC 95  TRIG 172*  CHOLHDL 4.1   Thyroid function studies No results for input(s): TSH, T4TOTAL, T3FREE, THYROIDAB in the last 72 hours.  Invalid input(s): FREET3 Anemia work up No results for input(s): VITAMINB12, FOLATE, FERRITIN, TIBC, IRON, RETICCTPCT in the last 72  hours. Urinalysis    Component Value Date/Time   COLORURINE YELLOW (A) 12/11/2016 0633   APPEARANCEUR CLEAR (A) 12/11/2016 0633   LABSPEC 1.015 12/11/2016 0633   PHURINE 6.0 12/11/2016 0633   GLUCOSEU NEGATIVE 12/11/2016 0633   HGBUR NEGATIVE 12/11/2016 0633   BILIRUBINUR NEGATIVE 12/11/2016 0633   KETONESUR NEGATIVE 12/11/2016 0633   PROTEINUR NEGATIVE 12/11/2016 0633   NITRITE NEGATIVE 12/11/2016 0633   LEUKOCYTESUR NEGATIVE 12/11/2016 0633   Sepsis Labs Invalid input(s): PROCALCITONIN,  WBC,  LACTICIDVEN Microbiology Recent Results (from the past 240 hour(s))  Respiratory Panel by RT PCR (Flu A&B, Covid) - Nasopharyngeal Swab     Status: None  Collection Time: 02/27/20  1:00 PM   Specimen: Nasopharyngeal Swab  Result Value Ref Range Status   SARS Coronavirus 2 by RT PCR NEGATIVE NEGATIVE Final    Comment: (NOTE) SARS-CoV-2 target nucleic acids are NOT DETECTED. The SARS-CoV-2 RNA is generally detectable in upper respiratoy specimens during the acute phase of infection. The lowest concentration of SARS-CoV-2 viral copies this assay can detect is 131 copies/mL. A negative result does not preclude SARS-Cov-2 infection and should not be used as the sole basis for treatment or other patient management decisions. A negative result may occur with  improper specimen collection/handling, submission of specimen other than nasopharyngeal swab, presence of viral mutation(s) within the areas targeted by this assay, and inadequate number of viral copies (<131 copies/mL). A negative result must be combined with clinical observations, patient history, and epidemiological information. The expected result is Negative. Fact Sheet for Patients:  https://www.moore.com/ Fact Sheet for Healthcare Providers:  https://www.young.biz/ This test is not yet ap proved or cleared by the [place] FDA and  has been authorized for detection and/or diagnosis of  SARS-CoV-2 by FDA under an Emergency Use Authorization (EUA). This EUA will remain  in effect (meaning this test can be used) for the duration of the COVID-19 declaration under Section 564(b)(1) of the Act, 21 U.S.C. section 360bbb-3(b)(1), unless the authorization is terminated or revoked sooner.    Influenza A by PCR NEGATIVE NEGATIVE Final   Influenza B by PCR NEGATIVE NEGATIVE Final    Comment: (NOTE) The Xpert Xpress SARS-CoV-2/FLU/RSV assay is intended as an aid in  the diagnosis of influenza from Nasopharyngeal swab specimens and  should not be used as a sole basis for treatment. Nasal washings and  aspirates are unacceptable for Xpert Xpress SARS-CoV-2/FLU/RSV  testing. Fact Sheet for Patients: https://www.moore.com/ Fact Sheet for Healthcare Providers: https://www.young.biz/ This test is not yet approved or cleared by the [place] FDA and  has been authorized for detection and/or diagnosis of SARS-CoV-2 by  FDA under an Emergency Use Authorization (EUA). This EUA will remain  in effect (meaning this test can be used) for the duration of the  Covid-19 declaration under Section 564(b)(1) of the Act, 21  U.S.C. section 360bbb-3(b)(1), unless the authorization is  terminated or revoked. Performed at [facility], [address] [place], [place] [address]     Time coordinating discharge: Over 30 minutes  SIGNED:  Arnetha Courser, MD  [facility] 02/28/2020, 2:35 PM  If 7PM-7AM, please contact night-coverage [email or web address]  This record has been created using Conservation officer, historic buildings. Errors have been sought and corrected,but may not always be located. Such creation errors do not reflect on the standard of care.

## 2020-02-28 NOTE — TOC Initial Note (Signed)
Transition of Care [facility]) - Initial/Assessment Note    Patient Details  Name: Ronald Pratt MRN: [id number] Date of Birth: 1976-06-12  [facility] of [facility] CM/SW Contact:    Shelbie Ammons, RN Phone Number: 02/28/2020, 10:27 AM  Clinical Narrative:     RNCM assessed patient at bedside, family members present. Patient is independent from home, reports that he still works, drives, and takes care of himself independently. Patient reports that he had began experiencing some shortness of breath and then noted a 4Lb weight gain in a day. He got in touch with his PCP and was eventually sent here. Patient does monitor his weight daily and understands importance of reporting weight gain. RNCM will remain available for any further needs.             Expected Discharge Plan: Home/Self Care Barriers to Discharge: Continued Medical Work up   Patient Goals and CMS Choice        Expected Discharge Plan and Services Expected Discharge Plan: Home/Self Care   Discharge Planning Services: CM Consult                                          Prior Living Arrangements/Services   Lives with:: Self   Do you feel safe going back to the place where you live?: Yes      Need for Family Participation in Patient Care: No (Comment) Care giver support system in place?: No (comment)   Criminal Activity/Legal Involvement Pertinent to Current Situation/Hospitalization: No - Comment as needed  Activities of Daily Living Home Assistive Devices/Equipment: None ADL Screening (condition at time of admission) Patient's cognitive ability adequate to safely complete daily activities?: Yes Is the patient deaf or have difficulty hearing?: No Does the patient have difficulty seeing, even when wearing glasses/contacts?: No Does the patient have difficulty concentrating, remembering, or making decisions?: No Patient able to express need for assistance with ADLs?: Yes Does the patient have difficulty  dressing or bathing?: No Independently performs ADLs?: Yes (appropriate for developmental age) Does the patient have difficulty walking or climbing stairs?: No Weakness of Legs: None Weakness of Arms/Hands: None  Permission Sought/Granted                  Emotional Assessment Appearance:: Appears stated age Attitude/Demeanor/Rapport: Engaged Affect (typically observed): Appropriate Orientation: : Oriented to Self, Oriented to Place, Oriented to  Time, Oriented to Situation Alcohol / Substance Use: Not Applicable Psych Involvement: No (comment)  Admission diagnosis:  Acute on chronic diastolic CHF (congestive heart failure) (HCC) [I50.33] Acute on chronic congestive heart failure, unspecified heart failure type ([place]) [I50.9] Patient Active Problem List   Diagnosis Date Noted  . Syncope 02/27/2020  . Elevated troponin 02/27/2020  . Viral syndrome 07/31/2018  . Tobacco use 03/16/2018  . Obstructive sleep apnea 03/16/2018  . Acute on chronic diastolic CHF (congestive heart failure) ([place]) 02/03/2018  . Umbilical hernia   . Seizures ([place]) 01/18/2017  . Gout 01/18/2017  . GERD (gastroesophageal reflux disease) 01/18/2017  . Dysrhythmia 01/18/2017  . Benign essential hypertension 12/29/2016  . Diverticulosis of colon without hemorrhage 12/20/2016  . Umbilical hernia without obstruction and without gangrene 12/20/2016  . Diverticulosis large intestine w/o perforation or abscess w/o bleeding 12/11/2016   PCP:  Tracie Harrier, MD Pharmacy:   [facility] [place]  [facility] [place] [address] Phone: [phone] Fax: [phone]     Social Determinants of Health (SDOH) Interventions    Readmission Risk Interventions No flowsheet data found.

## 2020-02-28 NOTE — Progress Notes (Signed)
Patient discharged to home via wheelchair in stable condition. Discharge instructions given all questions answered .

## 2020-03-03 ENCOUNTER — Telehealth: Payer: Self-pay | Admitting: Family

## 2020-03-03 NOTE — Telephone Encounter (Signed)
Spoke with patient who is doing well since he was discharged from hospital. He is taking his meds daily as he should, following a low sodium diet, walking daily and has no complaints currently related to heart failure. He is checking his weight daily as well and confirmed his follow up appointment with [place] on 5/13 at 1130am.   Deetta Perla, NT

## 2020-03-05 NOTE — Progress Notes (Signed)
Patient ID: Ronald Pratt, male    DOB: 1976-05-10, 44 y.o.   MRN: [id number]  HPI  Ronald Pratt is a 44 y/o male with a history of HTN, gout, GERD, seizures, obstructive sleep apnea, current tobacco use and chronic heart failure.   Echo report from 02/27/20 reviewed and showed an EF of 45-50% along with mild Ronald and mildly elevated PA pressure. Echo report from 02/03/18 reviewed and showed an EF of 55-60% along with mild Ronald with normal PA pressure.   Admitted 02/27/20 due to acute on chronic HF and syncope. Cardiology consult obtained. Initially given IV lasix with transition to oral diuretics. Brain MRI was negative. Discharged the following day.   He presents today for a follow-up visit although hasn't been seen since October 2019. He presents with a chief complaint of minimal shortness of breath upon moderate exertion. He describes this as chronic in nature having been present for several years. He has associated fatigue, light-headedness and chronic difficulty sleeping along with this. He denies any abdominal distention, palpitations, pedal edema, chest pain, cough or weight gain.   Overall he says that he feels "much better" since his recent admission.   Past Medical History:  Diagnosis Date  . CHF (congestive heart failure) (HCC)   . Diverticulosis large intestine w/o perforation or abscess w/o bleeding 12/11/2016  . Dysrhythmia    tacchycardia new..  put on metoprolol by dr. Juliann Pares for surgery  . GERD (gastroesophageal reflux disease)   . Gout   . Hypertension   . Obstructive sleep apnea   . 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  [name] Umbilical hernia    Past Surgical History:  Procedure Laterality Date  . ESOPHAGOGASTRODUODENOSCOPY ENDOSCOPY  2014  . INSERTION OF MESH N/A 01/04/2017   Procedure: INSERTION OF MESH;  Surgeon: Henrene Dodge, MD;  Location: [facility] ORS;  Service: General;  Laterality: N/A;  . UMBILICAL HERNIA REPAIR N/A 01/04/2017    Procedure: HERNIA REPAIR UMBILICAL ADULT with mesh;  Surgeon: Henrene Dodge, MD;  Location: [facility] ORS;  Service: General;  Laterality: N/A;   Family History  Problem Relation Age of Onset  . Breast cancer Mother   . Heart disease Mother   . Prostate cancer Father   . Colon cancer Father   . Diabetes Sister   . Heart disease Sister    Social History   Tobacco Use  . Smoking status: Current Every Day Smoker    Packs/day: 1.00    Years: 20.00    Pack years: 20.00    Types: Cigarettes  . Smokeless tobacco: Never Used  Substance Use Topics  . Alcohol use: No    Comment: SOBER SINCE 2010   No Known Allergies  Prior to Admission medications   Medication Sig Start Date End Date Taking? Authorizing Provider  allopurinol (ZYLOPRIM) 300 MG tablet Take 300 mg by mouth daily.    Yes [provider]  aspirin EC 81 MG EC tablet Take 1 tablet (81 mg total) by mouth daily. 02/29/20  Yes Arnetha Courser, MD  losartan (COZAAR) 100 MG tablet Take 1 tablet (100 mg total) by mouth daily. 02/04/18  Yes Wieting, Richard, MD  metoprolol succinate (TOPROL-XL) 25 MG 24 hr tablet Take 1 tablet (25 mg total) by mouth daily. 02/29/20  Yes Arnetha Courser, MD  omeprazole (PRILOSEC OTC) 20 MG tablet Take 20 mg by mouth daily.   Yes [provider]  potassium chloride SA (K-DUR,KLOR-CON)  20 MEQ tablet Take 1 tablet (20 mEq total) by mouth daily. 08/14/18  Yes Adar Rase, Inetta Fermo A, FNP  torsemide (DEMADEX) 20 MG tablet Take 2 tablets (40 mg total) by mouth daily. 02/28/20  Yes Arnetha Courser, MD  colchicine 0.6 MG tablet Take 0.6-1.2 mg by mouth See admin instructions. Take 2 tablets (1.2mg ) by mouth at onset of gout flare - take 1 additional tablet (0.6mg ) by mouth after an hour if needed    [provider]     Review of Systems  Constitutional: Positive for fatigue (with moderate exertion). Negative for appetite change and fever.  HENT: Negative for congestion, postnasal drip and sore throat.    Eyes: Negative.   Respiratory: Positive for shortness of breath (with moderate exertion). Negative for cough and chest tightness.   Cardiovascular: Negative for chest pain, palpitations and leg swelling.  Gastrointestinal: Negative for abdominal distention and abdominal pain.  Endocrine: Negative.   Genitourinary: Negative.   Musculoskeletal: Negative for arthralgias and back pain.  Skin: Negative.   Allergic/Immunologic: Negative.   Neurological: Positive for light-headedness (at times). Negative for dizziness and headaches.  Hematological: Negative for adenopathy. Does not bruise/bleed easily.  Psychiatric/Behavioral: Positive for sleep disturbance (not sleeping well). Negative for dysphoric mood. The patient is not nervous/anxious.    Vitals:   03/06/20 1150  BP: 124/90  Pulse: 99  Resp: 20  SpO2: 93%  Weight: 296 lb 2 oz (134.3 kg)  Height: 6\' 1"  (1.854 m)   Wt Readings from Last 3 Encounters:  03/06/20 296 lb 2 oz (134.3 kg)  02/28/20 297 lb 12.8 oz (135.1 kg)  08/14/18 267 lb (121.1 kg)   Lab Results  Component Value Date   CREATININE 1.29 (H) 02/28/2020   CREATININE 1.37 (H) 02/27/2020   CREATININE 1.27 (H) 08/14/2018     Physical Exam Vitals and nursing note reviewed.  Constitutional:      Appearance: He is well-developed.  HENT:     Head: Normocephalic and atraumatic.  Neck:     Vascular: No JVD.  Cardiovascular:     Rate and Rhythm: Normal rate and regular rhythm.  Pulmonary:     Effort: Pulmonary effort is normal. No respiratory distress.     Breath sounds: No wheezing or rales.  Abdominal:     General: There is no distension.     Palpations: Abdomen is soft.  Musculoskeletal:     Cervical back: Normal range of motion and neck supple.     Right lower leg: No tenderness. Edema (trace pitting) present.     Left lower leg: No tenderness. Edema (trace pitting) present.  Skin:    General: Skin is warm and dry.  Neurological:     Mental Status: He is  alert and oriented to person, place, and time.  Psychiatric:        Behavior: Behavior normal.    Assessment & Plan:  1: Chronic heart failure with mildly reduced ejection fraction- - NYHA class II - euvolemic today - weighing daily and he was reminded to call for an overnight weight gain of >2 pounds or a weekly weight gain of >5 pounds - not adding salt to his food and has been reading food labels. Reviewed the importance of closely following a 2000mg  sodium diet  - saw cardiology 08/16/2018) 03/04/20 & returns 04/16/20 - BNP 02/27/20 was 224.0 - PharmD reconciled medications with the patient - scheduled for stress test mid- June - consider changing losartan to entresto after stress test results  2: HTN- - BP looks good today - saw PCP (Hande) 02/27/20 - BMP from 02/28/20 reviewed and showed sodium 140, potassium 3.4, creatinine 1.29 and GFR >60  3: Tobacco use- - smoking 1 ppd of cigarettes - complete cessation discussed for 3 minutes with patient  4: Obstructive sleep apnea- - unable to afford his CPAP   Medication bottles reviewed.   Return here in 2 months or sooner for any questions/problems before then.

## 2020-03-06 ENCOUNTER — Other Ambulatory Visit: Payer: Self-pay

## 2020-03-06 ENCOUNTER — Ambulatory Visit: Payer: PRIVATE HEALTH INSURANCE | Attending: Family | Admitting: Family

## 2020-03-06 ENCOUNTER — Encounter: Payer: Self-pay | Admitting: Family

## 2020-03-06 VITALS — BP 124/90 | HR 99 | Resp 20 | Ht 73.0 in | Wt 296.1 lb

## 2020-03-06 DIAGNOSIS — I5022 Chronic systolic (congestive) heart failure: Secondary | ICD-10-CM | POA: Diagnosis not present

## 2020-03-06 DIAGNOSIS — K219 Gastro-esophageal reflux disease without esophagitis: Secondary | ICD-10-CM | POA: Insufficient documentation

## 2020-03-06 DIAGNOSIS — Z803 Family history of malignant neoplasm of breast: Secondary | ICD-10-CM | POA: Insufficient documentation

## 2020-03-06 DIAGNOSIS — Z7982 Long term (current) use of aspirin: Secondary | ICD-10-CM | POA: Diagnosis not present

## 2020-03-06 DIAGNOSIS — I110 Hypertensive heart disease with heart failure: Secondary | ICD-10-CM | POA: Diagnosis not present

## 2020-03-06 DIAGNOSIS — M109 Gout, unspecified: Secondary | ICD-10-CM | POA: Insufficient documentation

## 2020-03-06 DIAGNOSIS — Z833 Family history of diabetes mellitus: Secondary | ICD-10-CM | POA: Diagnosis not present

## 2020-03-06 DIAGNOSIS — F17210 Nicotine dependence, cigarettes, uncomplicated: Secondary | ICD-10-CM | POA: Diagnosis not present

## 2020-03-06 DIAGNOSIS — Z8042 Family history of malignant neoplasm of prostate: Secondary | ICD-10-CM | POA: Diagnosis not present

## 2020-03-06 DIAGNOSIS — Z8249 Family history of ischemic heart disease and other diseases of the circulatory system: Secondary | ICD-10-CM | POA: Diagnosis not present

## 2020-03-06 DIAGNOSIS — Z79899 Other long term (current) drug therapy: Secondary | ICD-10-CM | POA: Diagnosis not present

## 2020-03-06 DIAGNOSIS — G4733 Obstructive sleep apnea (adult) (pediatric): Secondary | ICD-10-CM | POA: Insufficient documentation

## 2020-03-06 DIAGNOSIS — Z720 Tobacco use: Secondary | ICD-10-CM

## 2020-03-06 DIAGNOSIS — Z800 Family history of malignant neoplasm of digestive organs: Secondary | ICD-10-CM | POA: Insufficient documentation

## 2020-03-06 DIAGNOSIS — I10 Essential (primary) hypertension: Secondary | ICD-10-CM

## 2020-03-06 NOTE — Progress Notes (Signed)
[facility] - HEART FAILURE CLINIC - PHARMACIST COUNSELING NOTE  ADHERENCE ASSESSMENT  Adherence strategy: Fiance helps patient with medications and utilizes a pillbox. Patient brought medications with him to visit today.   Do you ever forget to take your medication? [] Yes (1) [x] No (0)  Do you ever skip doses due to side effects? [] Yes (1) [x] No (0)  Do you have trouble affording your medicines? [] Yes (1) [x] No (0)  Are you ever unable to pick up your medication due to transportation difficulties? [] Yes (1) [x] No (0)  Do you ever stop taking your medications because you don't believe they are helping? [] Yes (1) [x] No (0)  Total score 0   Recommendations given to patient about increasing adherence: None needed  Guideline-Directed Medical Therapy/Evidence Based Medicine  ACE/ARB/ARNI: Losartan 100 mg daily Beta Blocker: Metoprolol succinate 25 mg daily Aldosterone Antagonist: None Diuretic: Torsemide 40 mg daily    SUBJECTIVE  HPI: Patient is a 44 y/o M with PMH as below who presents to CHF clinic for follow-up. He was hospitalized 02/27/20-02/28/20 for syncopal episodes / CHF exacerbation. Torsemide was increased and metoprolol was decreased.   Past Medical History:  Diagnosis Date  . CHF (congestive heart failure) (HCC)   . Diverticulosis large intestine w/o perforation or abscess w/o bleeding 12/11/2016  . Dysrhythmia    tacchycardia new..  put on metoprolol by dr. for surgery  . GERD (gastroesophageal reflux disease)   . Gout   . Hypertension   . Obstructive sleep apnea   . 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  Umbilical hernia      OBJECTIVE   Vital signs: HR 99, BP 124/90, weight (pounds) 296 ECHO: Date 02/27/20, EF 45-50%, notes: Mildly decreased LV function, no LV regional wall motion abnormalities, diastolic parameters normal. RV normal. Mildly elevated PA systolic pressure. No significant  valvular disease  BMP Latest Ref Rng & Units 02/28/2020 02/27/2020 08/14/2018  Glucose 70 - 99 mg/dL 55) 04/28/20) 04/29/20)  BUN 6 - 20 mg/dL 14 14 17   Creatinine 0.61 - 1.24 mg/dL 12/13/2016) Juliann Pares) [name])  Sodium 135 - 145 mmol/L 140 140 141  Potassium 3.5 - 5.1 mmol/L 3.4(L) 3.8 3.7  Chloride 98 - 111 mmol/L 103 104 100  CO2 22 - 32 mmol/L 28 27 30   Calcium 8.9 - 10.3 mg/dL 9.0 9.1 9.2    ASSESSMENT Patient is well appearing in no acute distress. Fiance present with patient at visit. He is short of breath today but reports it is due to the long walk to clinic. Patient endorses taking medications as prescribed. Denies missed doses. Denies adverse effects of therapy. Reports that symptoms of hypotension have improved / no further syncopal episodes since being discharged from the hospital. He reports he often has a dry mouth at night and that he was put on a fluid restriction of 36 oz per day. Reports weighing himself most days at home and that weights have been 292-294 lbs.   PLAN  1). CHF -GDMT with losartan 100 mg daily, metoprolol succinate 25 mg daily -Fluid management with torsemide 40 mg daily + potassium 20 mEq daily -Drinking 36 oz fluid per day -Continue daily weights -Continue low salt diet -Discussed with provider - no changes to medication regimen today. Considering switching losartan to Entresto in the future.   2). Hypertension -Antihypertensive regimen includes losartan 100 mg daily, metoprolol succinate 25 mg daily, and torsemide 40 mg daily -Not checking BP  at home regularly  3). Prediabetes -HgbA1C was 6.4% on 02/28/2020 -Lifestyle management only at this time -Patient reports he was encouraged to exercise but that he is concerned with becoming dehydrated due to fluid restriction  4). OSA -Has had trouble getting CPAP machine due to cost  5). Gout -Denies recent flares -Allopurinol 300 mg daily  -Colchicine PRN flares - patient reports he never received this  medication  6). GERD -Omeprazole 20 mg daily  7). Tobacco abuse -Smoking 1 PPD -Reports he had recently quit for 1 year with pharmacological aid (Chantix) but that his new insurance will not cover this medication   Time spent: 25 minutes  Tressie Ellis Pharmacy Resident 03/06/2020 11:47 AM    Current Outpatient Medications:  .  allopurinol (ZYLOPRIM) 300 MG tablet, Take 300 mg by mouth daily. , Disp: , Rfl:  .  aspirin EC 81 MG EC tablet, Take 1 tablet (81 mg total) by mouth daily., Disp: 90 tablet, Rfl: 1 .  colchicine 0.6 MG tablet, Take 0.6-1.2 mg by mouth See admin instructions. Take 2 tablets (1.2mg ) by mouth at onset of gout flare - take 1 additional tablet (0.6mg ) by mouth after an hour if needed, Disp: , Rfl:  .  losartan (COZAAR) 100 MG tablet, Take 1 tablet (100 mg total) by mouth daily., Disp: 30 tablet, Rfl: 0 .  metoprolol succinate (TOPROL-XL) 25 MG 24 hr tablet, Take 1 tablet (25 mg total) by mouth daily., Disp: 90 tablet, Rfl: 0 .  omeprazole (PRILOSEC OTC) 20 MG tablet, Take 20 mg by mouth daily., Disp: , Rfl:  .  potassium chloride SA (K-DUR,KLOR-CON) 20 MEQ tablet, Take 1 tablet (20 mEq total) by mouth daily., Disp: 90 tablet, Rfl: 3 .  torsemide (DEMADEX) 20 MG tablet, Take 2 tablets (40 mg total) by mouth daily., Disp: 60 tablet, Rfl: 1   COUNSELING POINTS/CLINICAL PEARLS  Metoprolol Succinate (Goal: 200 mg once daily) Warn patient to avoid activities requiring mental alertness or coordination until drug effects are realized, as drug may cause dizziness. Tell patient planning major surgery with anesthesia to alert physician that drug is being used, as drug impairs ability of heart to respond to reflex adrenergic stimuli. Drug may cause diarrhea, fatigue, headache, or depression. Advise diabetic patient to carefully monitor blood glucose as drug may mask symptoms of hypoglycemia. Patient should take extended-release tablet with or immediately following  meals. Counsel patient against sudden discontinuation of drug, as this may precipitate hypertension, angina, or myocardial infarction. In the event of a missed dose, counsel patient to skip the missed dose and maintain a regular dosing schedule. Losartan (Goal: 150 mg once daily)  Warn male patient to avoid pregnancy and to report a pregnancy that occurs during therapy.  Side effects may include dizziness, upper respiratory infection, nasal congestion, and back pain.  Warn patient to avoid use of potassium supplements or potassium-containing salt substitutes unless they consult healthcare provider. Torsemide  Side effects may include excessive urination.  Tell patient to report symptoms of ototoxicity.  Instruct patient to report lightheadedness or syncope.  Warn patient to avoid use of nonprescription NSAID products without first discussing it with their healthcare provider.  DRUGS TO AVOID IN HEART FAILURE  Drug or Class Mechanism  Analgesics . NSAIDs . COX-2 inhibitors . Glucocorticoids  Sodium and water retention, increased systemic vascular resistance, decreased response to diuretics   Diabetes Medications . Metformin . Thiazolidinediones o Rosiglitazone (Avandia) o Pioglitazone (Actos) . DPP4 Inhibitors o Saxagliptin (Onglyza) o Sitagliptin (  Januvia)   Lactic acidosis Possible calcium channel blockade   Unknown  Antiarrhythmics . Class I  o Flecainide o Disopyramide . Class III o Sotalol . Other o Dronedarone  Negative inotrope, proarrhythmic   Proarrhythmic, beta blockade  Negative inotrope  Antihypertensives . Alpha Blockers o Doxazosin . Calcium Channel Blockers o Diltiazem o Verapamil o Nifedipine . Central Alpha Adrenergics o Moxonidine . Peripheral Vasodilators o Minoxidil  Increases renin and aldosterone  Negative inotrope    Possible sympathetic withdrawal  Unknown  Anti-infective . Itraconazole . Amphotericin B  Negative  inotrope Unknown  Hematologic . Anagrelide . Cilostazol   Possible inhibition of PD IV Inhibition of PD III causing arrhythmias  Neurologic/Psychiatric . Stimulants . Anti-Seizure Drugs o Carbamazepine o Pregabalin . Antidepressants o Tricyclics o Citalopram . Parkinsons o Bromocriptine o Pergolide o Pramipexole . Antipsychotics o Clozapine . Antimigraine o Ergotamine o Methysergide . Appetite suppressants . Bipolar o Lithium  Peripheral alpha and beta agonist activity  Negative inotrope and chronotrope Calcium channel blockade  Negative inotrope, proarrhythmic Dose-dependent QT prolongation  Excessive serotonin activity/valvular damage Excessive serotonin activity/valvular damage Unknown  IgE mediated hypersensitivy, calcium channel blockade  Excessive serotonin activity/valvular damage Excessive serotonin activity/valvular damage Valvular damage  Direct myofibrillar degeneration, adrenergic stimulation  Antimalarials . Chloroquine . Hydroxychloroquine Intracellular inhibition of lysosomal enzymes  Urologic Agents . Alpha Blockers o Doxazosin o Prazosin o Tamsulosin o Terazosin  Increased renin and aldosterone  Adapted from Page RL, et al. "Drugs That May Cause or Exacerbate Heart Failure: A Scientific Statement from the [place]." Circulation 2016; 407:W80-S81. DOI: 10.1161/CIR.0000000000000426   MEDICATION ADHERENCES TIPS AND STRATEGIES 1. Taking medication as prescribed improves patient outcomes in heart failure (reduces hospitalizations, improves symptoms, increases survival) 2. Side effects of medications can be managed by decreasing doses, switching agents, stopping drugs, or adding additional therapy. Please let someone in the [facility] know if you have having bothersome side effects so we can modify your regimen. Do not alter your medication regimen without talking to [place].  3. Medication reminders can help patients  remember to take drugs on time. If you are missing or forgetting doses you can try linking behaviors, using pill boxes, or an electronic reminder like an alarm on your phone or an app. Some people can also get automated phone calls as medication reminders.

## 2020-03-06 NOTE — Patient Instructions (Signed)
Continue weighing daily and call for an overnight weight gain of > 2 pounds or a weekly weight gain of >5 pounds. 

## 2020-05-19 NOTE — Progress Notes (Deleted)
Patient ID: Ronald Pratt, male    DOB: April 09, 1976, 44 y.o.   MRN: [id number]  HPI  Ronald Pratt is a 44 y/o male with a history of HTN, gout, GERD, seizures, obstructive sleep apnea, current tobacco use and chronic heart failure.   Echo report from 02/27/20 reviewed and showed an EF of 45-50% along with mild Ronald and mildly elevated PA pressure. Echo report from 02/03/18 reviewed and showed an EF of 55-60% along with mild Ronald with normal PA pressure.   Admitted 02/27/20 due to acute on chronic HF and syncope. Cardiology consult obtained. Initially given IV lasix with transition to oral diuretics. Brain MRI was negative. Discharged the following day.   He presents today for a follow-up visit with a chief complaint of  Past Medical History:  Diagnosis Date  . CHF (congestive heart failure) (HCC)   . Diverticulosis large intestine w/o perforation or abscess w/o bleeding 12/11/2016  . Dysrhythmia    tacchycardia new..  put on metoprolol by dr. Juliann Pares for surgery  . GERD (gastroesophageal reflux disease)   . Gout   . Hypertension   . Obstructive sleep apnea   . 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  [name] Umbilical hernia    Past Surgical History:  Procedure Laterality Date  . ESOPHAGOGASTRODUODENOSCOPY ENDOSCOPY  2014  . INSERTION OF MESH N/A 01/04/2017   Procedure: INSERTION OF MESH;  Surgeon: Henrene Dodge, MD;  Location: [facility] ORS;  Service: General;  Laterality: N/A;  . UMBILICAL HERNIA REPAIR N/A 01/04/2017   Procedure: HERNIA REPAIR UMBILICAL ADULT with mesh;  Surgeon: Henrene Dodge, MD;  Location: [facility] ORS;  Service: General;  Laterality: N/A;   Family History  Problem Relation Age of Onset  . Breast cancer Mother   . Heart disease Mother   . Prostate cancer Father   . Colon cancer Father   . Diabetes Sister   . Heart disease Sister    Social History   Tobacco Use  . Smoking status: Current Every Day Smoker    Packs/day: 1.00    Years: 20.00     Pack years: 20.00    Types: Cigarettes  . Smokeless tobacco: Never Used  Substance Use Topics  . Alcohol use: No    Comment: SOBER SINCE 2010   No Known Allergies     Review of Systems  Constitutional: Positive for fatigue (with moderate exertion). Negative for appetite change and fever.  HENT: Negative for congestion, postnasal drip and sore throat.   Eyes: Negative.   Respiratory: Positive for shortness of breath (with moderate exertion). Negative for cough and chest tightness.   Cardiovascular: Negative for chest pain, palpitations and leg swelling.  Gastrointestinal: Negative for abdominal distention and abdominal pain.  Endocrine: Negative.   Genitourinary: Negative.   Musculoskeletal: Negative for arthralgias and back pain.  Skin: Negative.   Allergic/Immunologic: Negative.   Neurological: Positive for light-headedness (at times). Negative for dizziness and headaches.  Hematological: Negative for adenopathy. Does not bruise/bleed easily.  Psychiatric/Behavioral: Positive for sleep disturbance (not sleeping well). Negative for dysphoric mood. The patient is not nervous/anxious.       Physical Exam Vitals and nursing note reviewed.  Constitutional:      Appearance: He is well-developed.  HENT:     Head: Normocephalic and atraumatic.  Neck:     Vascular: No JVD.  Cardiovascular:     Rate and Rhythm: Normal rate and regular rhythm.  Pulmonary:  Effort: Pulmonary effort is normal. No respiratory distress.     Breath sounds: No wheezing or rales.  Abdominal:     General: There is no distension.     Palpations: Abdomen is soft.  Musculoskeletal:     Cervical back: Normal range of motion and neck supple.     Right lower leg: No tenderness. Edema (trace pitting) present.     Left lower leg: No tenderness. Edema (trace pitting) present.  Skin:    General: Skin is warm and dry.  Neurological:     Mental Status: He is alert and oriented to person, place, and  time.  Psychiatric:        Behavior: Behavior normal.    Assessment & Plan:  1: Chronic heart failure with preserved ejection fraction without structural changes- - NYHA class II - euvolemic today - weighing daily and he was reminded to call for an overnight weight gain of >2 pounds or a weekly weight gain of >5 pounds - weight 296.2 from last visit here ~ 2.5 months ago - not adding salt to his food and has been reading food labels. Reviewed the importance of closely following a 2000mg  sodium diet  - saw cardiology ) 04/17/20 - BNP 02/27/20 was 224.0 - PharmD reconciled medications with the patient - stress test completed 04/14/20 which showed EF 55% and negative scan  2: HTN- - BP  - saw PCP (Hande) 03/18/20 - BMP from 02/28/20 reviewed and showed sodium 140, potassium 3.4, creatinine 1.29 and GFR >60  3: Tobacco use- - smoking 1 ppd of cigarettes - complete cessation discussed for 3 minutes with patient  4: Obstructive sleep apnea- - unable to afford his CPAP   Medication bottles reviewed.

## 2020-05-20 ENCOUNTER — Ambulatory Visit: Payer: PRIVATE HEALTH INSURANCE | Admitting: Family

## 2020-05-20 ENCOUNTER — Telehealth: Payer: Self-pay | Admitting: Family

## 2020-05-20 NOTE — Telephone Encounter (Signed)
Patient did not show for his Heart Failure Clinic appointment on 05/20/20. Will attempt to reschedule.  

## 2021-01-29 ENCOUNTER — Other Ambulatory Visit: Payer: Self-pay | Admitting: Internal Medicine

## 2021-01-29 ENCOUNTER — Other Ambulatory Visit (HOSPITAL_COMMUNITY): Payer: Self-pay | Admitting: Internal Medicine

## 2021-01-29 DIAGNOSIS — R1900 Intra-abdominal and pelvic swelling, mass and lump, unspecified site: Secondary | ICD-10-CM

## 2021-02-24 ENCOUNTER — Ambulatory Visit: Payer: PRIVATE HEALTH INSURANCE

## 2021-12-31 DIAGNOSIS — I5022 Chronic systolic (congestive) heart failure: Secondary | ICD-10-CM | POA: Diagnosis present

## 2022-07-01 ENCOUNTER — Other Ambulatory Visit
Admission: RE | Admit: 2022-07-01 | Discharge: 2022-07-01 | Disposition: A | Discharge status: Home / Self Care | Payer: Commercial Managed Care - HMO | Source: Ambulatory Visit | Attending: Student | Admitting: Student

## 2022-07-01 DIAGNOSIS — I5032 Chronic diastolic (congestive) heart failure: Secondary | ICD-10-CM | POA: Diagnosis present

## 2022-07-01 LAB — BRAIN NATRIURETIC PEPTIDE: B Natriuretic Peptide: 5.3 pg/mL (ref 0.0–100.0)

## 2023-06-13 ENCOUNTER — Emergency Department: Payer: Managed Care, Other (non HMO)

## 2023-06-13 ENCOUNTER — Other Ambulatory Visit: Payer: Self-pay

## 2023-06-13 ENCOUNTER — Inpatient Hospital Stay
Admission: EM | Admit: 2023-06-13 | Discharge: 2023-06-16 | DRG: 163 | Disposition: A | Discharge status: Home / Self Care | Payer: Managed Care, Other (non HMO) | Attending: Internal Medicine | Admitting: Internal Medicine

## 2023-06-13 DIAGNOSIS — M109 Gout, unspecified: Secondary | ICD-10-CM | POA: Diagnosis present

## 2023-06-13 DIAGNOSIS — I10 Essential (primary) hypertension: Secondary | ICD-10-CM | POA: Diagnosis present

## 2023-06-13 DIAGNOSIS — K219 Gastro-esophageal reflux disease without esophagitis: Secondary | ICD-10-CM | POA: Diagnosis present

## 2023-06-13 DIAGNOSIS — R0602 Shortness of breath: Secondary | ICD-10-CM

## 2023-06-13 DIAGNOSIS — Z7982 Long term (current) use of aspirin: Secondary | ICD-10-CM | POA: Diagnosis not present

## 2023-06-13 DIAGNOSIS — E119 Type 2 diabetes mellitus without complications: Secondary | ICD-10-CM | POA: Diagnosis present

## 2023-06-13 DIAGNOSIS — J9601 Acute respiratory failure with hypoxia: Secondary | ICD-10-CM | POA: Diagnosis not present

## 2023-06-13 DIAGNOSIS — I2609 Other pulmonary embolism with acute cor pulmonale: Principal | ICD-10-CM

## 2023-06-13 DIAGNOSIS — Z803 Family history of malignant neoplasm of breast: Secondary | ICD-10-CM

## 2023-06-13 DIAGNOSIS — I5022 Chronic systolic (congestive) heart failure: Secondary | ICD-10-CM | POA: Diagnosis present

## 2023-06-13 DIAGNOSIS — N179 Acute kidney failure, unspecified: Secondary | ICD-10-CM | POA: Diagnosis present

## 2023-06-13 DIAGNOSIS — J989 Respiratory disorder, unspecified: Secondary | ICD-10-CM | POA: Diagnosis not present

## 2023-06-13 DIAGNOSIS — Z6841 Body Mass Index (BMI) 40.0 and over, adult: Secondary | ICD-10-CM

## 2023-06-13 DIAGNOSIS — F17210 Nicotine dependence, cigarettes, uncomplicated: Secondary | ICD-10-CM | POA: Diagnosis present

## 2023-06-13 DIAGNOSIS — Z1152 Encounter for screening for COVID-19: Secondary | ICD-10-CM | POA: Diagnosis not present

## 2023-06-13 DIAGNOSIS — I2602 Saddle embolus of pulmonary artery with acute cor pulmonale: Secondary | ICD-10-CM | POA: Diagnosis present

## 2023-06-13 DIAGNOSIS — E876 Hypokalemia: Secondary | ICD-10-CM | POA: Diagnosis present

## 2023-06-13 DIAGNOSIS — I110 Hypertensive heart disease with heart failure: Secondary | ICD-10-CM | POA: Diagnosis present

## 2023-06-13 DIAGNOSIS — Z800 Family history of malignant neoplasm of digestive organs: Secondary | ICD-10-CM | POA: Diagnosis not present

## 2023-06-13 DIAGNOSIS — Z8249 Family history of ischemic heart disease and other diseases of the circulatory system: Secondary | ICD-10-CM | POA: Diagnosis not present

## 2023-06-13 DIAGNOSIS — Z833 Family history of diabetes mellitus: Secondary | ICD-10-CM | POA: Diagnosis not present

## 2023-06-13 DIAGNOSIS — Z8042 Family history of malignant neoplasm of prostate: Secondary | ICD-10-CM

## 2023-06-13 DIAGNOSIS — Z79899 Other long term (current) drug therapy: Secondary | ICD-10-CM

## 2023-06-13 DIAGNOSIS — G4733 Obstructive sleep apnea (adult) (pediatric): Secondary | ICD-10-CM | POA: Diagnosis present

## 2023-06-13 DIAGNOSIS — I2699 Other pulmonary embolism without acute cor pulmonale: Secondary | ICD-10-CM | POA: Diagnosis not present

## 2023-06-13 DIAGNOSIS — J449 Chronic obstructive pulmonary disease, unspecified: Secondary | ICD-10-CM | POA: Diagnosis present

## 2023-06-13 DIAGNOSIS — Z7952 Long term (current) use of systemic steroids: Secondary | ICD-10-CM

## 2023-06-13 DIAGNOSIS — Z7984 Long term (current) use of oral hypoglycemic drugs: Secondary | ICD-10-CM | POA: Diagnosis not present

## 2023-06-13 DIAGNOSIS — F17200 Nicotine dependence, unspecified, uncomplicated: Secondary | ICD-10-CM | POA: Diagnosis present

## 2023-06-13 DIAGNOSIS — R569 Unspecified convulsions: Secondary | ICD-10-CM

## 2023-06-13 LAB — BASIC METABOLIC PANEL
Anion gap: 14 (ref 5–15)
BUN: 24 mg/dL — ABNORMAL HIGH (ref 6–20)
CO2: 27 mmol/L (ref 22–32)
Calcium: 9 mg/dL (ref 8.9–10.3)
Chloride: 97 mmol/L — ABNORMAL LOW (ref 98–111)
Creatinine, Ser: 1.65 mg/dL — ABNORMAL HIGH (ref 0.61–1.24)
GFR, Estimated: 52 mL/min — ABNORMAL LOW (ref >60)
Glucose, Bld: 111 mg/dL — ABNORMAL HIGH (ref 70–99)
Potassium: 3.4 mmol/L — ABNORMAL LOW (ref 3.5–5.1)
Sodium: 138 mmol/L (ref 135–145)

## 2023-06-13 LAB — D-DIMER, QUANTITATIVE: D-Dimer, Quant: 3.82 ug{FEU}/mL — ABNORMAL HIGH (ref 0.00–0.50)

## 2023-06-13 LAB — CBC
HCT: 51.6 % (ref 39.0–52.0)
Hemoglobin: 17.2 g/dL — ABNORMAL HIGH (ref 13.0–17.0)
MCH: 31.6 pg (ref 26.0–34.0)
MCHC: 33.3 g/dL (ref 30.0–36.0)
MCV: 94.9 fL (ref 80.0–100.0)
Platelets: 240 10*3/uL (ref 150–400)
RBC: 5.44 MIL/uL (ref 4.22–5.81)
RDW: 12.3 % (ref 11.5–15.5)
WBC: 14.6 10*3/uL — ABNORMAL HIGH (ref 4.0–10.5)
nRBC: 0 % (ref 0.0–0.2)

## 2023-06-13 LAB — PROTIME-INR
INR: 1.1 (ref 0.8–1.2)
Prothrombin Time: 14.6 seconds (ref 11.4–15.2)

## 2023-06-13 LAB — BRAIN NATRIURETIC PEPTIDE: B Natriuretic Peptide: 801.7 pg/mL — ABNORMAL HIGH (ref 0.0–100.0)

## 2023-06-13 LAB — SARS CORONAVIRUS 2 BY RT PCR: SARS Coronavirus 2 by RT PCR: NEGATIVE

## 2023-06-13 LAB — TROPONIN I (HIGH SENSITIVITY)
Troponin I (High Sensitivity): 27 ng/L — ABNORMAL HIGH (ref <18)
Troponin I (High Sensitivity): 27 ng/L — ABNORMAL HIGH (ref <18)

## 2023-06-13 LAB — APTT: aPTT: 28 seconds (ref 24–36)

## 2023-06-13 MED ORDER — MORPHINE SULFATE (PF) 2 MG/ML IV SOLN: 2.0000 mg | INTRAVENOUS | Status: DC | PRN

## 2023-06-13 MED ORDER — ONDANSETRON HCL 4 MG PO TABS: 4.0000 mg | ORAL_TABLET | Freq: Four times a day (QID) | ORAL | Status: DC | PRN

## 2023-06-13 MED ORDER — HEPARIN (PORCINE) 25000 UT/250ML-% IV SOLN
2050.0000 [IU]/h | INTRAVENOUS | Status: AC
  Administered 2023-06-14: 2150 [IU]/h via INTRAVENOUS
  Administered 2023-06-14 (×2): 1900 [IU]/h via INTRAVENOUS
  Administered 2023-06-15: 2050 [IU]/h via INTRAVENOUS
  Filled 2023-06-13 (×3): qty 250

## 2023-06-13 MED ORDER — ACETAMINOPHEN 650 MG RE SUPP: 650.0000 mg | Freq: Four times a day (QID) | RECTAL | Status: DC | PRN

## 2023-06-13 MED ORDER — IOHEXOL 350 MG/ML SOLN
100.0000 mL | Freq: Once | INTRAVENOUS | Status: AC | PRN
  Administered 2023-06-13: 100 mL via INTRAVENOUS

## 2023-06-13 MED ORDER — ACETAMINOPHEN 325 MG PO TABS: 650.0000 mg | ORAL_TABLET | Freq: Four times a day (QID) | ORAL | Status: DC | PRN

## 2023-06-13 MED ORDER — HEPARIN BOLUS VIA INFUSION
7000.0000 [IU] | Freq: Once | INTRAVENOUS | Status: AC
  Administered 2023-06-14: 7000 [IU] via INTRAVENOUS
  Filled 2023-06-13: qty 7000

## 2023-06-13 MED ORDER — NICOTINE 21 MG/24HR TD PT24
21.0000 mg | MEDICATED_PATCH | Freq: Every day | TRANSDERMAL | Status: DC
  Filled 2023-06-13: qty 1

## 2023-06-13 MED ORDER — HYDROCODONE-ACETAMINOPHEN 5-325 MG PO TABS: 1.0000 | ORAL_TABLET | ORAL | Status: DC | PRN

## 2023-06-13 MED ORDER — FUROSEMIDE 10 MG/ML IJ SOLN
40.0000 mg | Freq: Once | INTRAMUSCULAR | Status: AC
  Administered 2023-06-13: 40 mg via INTRAVENOUS
  Filled 2023-06-13: qty 4

## 2023-06-13 MED ORDER — SODIUM CHLORIDE 0.9% FLUSH
3.0000 mL | Freq: Two times a day (BID) | INTRAVENOUS | Status: DC
  Administered 2023-06-14 – 2023-06-16 (×6): 3 mL via INTRAVENOUS

## 2023-06-13 MED ORDER — ONDANSETRON HCL 4 MG/2ML IJ SOLN: 4.0000 mg | Freq: Four times a day (QID) | INTRAMUSCULAR | Status: DC | PRN

## 2023-06-13 NOTE — Assessment & Plan Note (Signed)
Not acutely exacerbated DuoNebs as needed

## 2023-06-13 NOTE — Assessment & Plan Note (Signed)
-  Nicotine patch 

## 2023-06-13 NOTE — ED Notes (Signed)
Ambulation trial results- pt able to ambulate without assistance/difficulty. Pt A&O x4, oxygen saturations remain above 90% during ambulation. No obvious shortness of breathe noted.

## 2023-06-13 NOTE — Assessment & Plan Note (Signed)
Patient with mild exacerbation related to RV strain from massive PE Received Lasix in the ED Will continue IV Lasix To ensure continued hemodynamic stability, we will hold off on losartan and metoprolol for now Daily weights with intake and output monitoring Will get echocardiogram, last EF 45 to 50% September 2023

## 2023-06-13 NOTE — Assessment & Plan Note (Addendum)
Patient with shortness of breath on exertion x 2 days Troponin 27 and BNP 801 CTA with large burden of PE extending from bilateral middle pulmonary arteries all the way to the subsegmental branches with RV LV ratio of 1.7 Patient hemodynamically stable.  Tachycardic to 116 and tachypneic to 22 but with normal blood pressure and O2 sat 94% on room air Continue heparin infusion Discussed with vascular surgeon, Dr. Wyn Quaker: States patient's vitals are relatively stable so okay to hold off till in the a.m. for intervention Also received Lasix IV in the ED due to cor pulmonale Close monitoring in stepdown Supplemental oxygen

## 2023-06-13 NOTE — Assessment & Plan Note (Signed)
Complicating factor to overall prognosis and care 

## 2023-06-13 NOTE — Progress Notes (Addendum)
ANTICOAGULATION CONSULT NOTE  Pharmacy Consult for heparin infusion Indication: pulmonary embolus  No Known Allergies  Patient Measurements:   Heparin Dosing Weight: 115.5 kg  Vital Signs: Temp: 98 F (36.7 C) (08/19 2119) Temp Source: Oral (08/19 2010) BP: 139/94 (08/19 2119) Pulse Rate: 116 (08/19 2119)  Labs: Recent Labs    06/13/23 1549 06/13/23 1757  HGB 17.2*  --   HCT 51.6  --   PLT 240  --   CREATININE 1.65*  --   TROPONINIHS 27* 27*    CrCl cannot be calculated (Unknown ideal weight.).   Medical History: Past Medical History:  Diagnosis Date   CHF (congestive heart failure) (HCC)    Diverticulosis large intestine w/o perforation or abscess w/o bleeding 12/11/2016   Dysrhythmia    tacchycardia new..  put on metoprolol by dr. 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   Umbilical hernia     Assessment: Pt is a 47 yo male presenting to ED c/o SOB with possible PE, pending CT Angio Chest PE exam.  Goal of Therapy:  Heparin level 0.3-0.7 units/ml Monitor platelets by anticoagulation protocol: Yes   Plan:  Bolus 7000 units x 1 Start heparin infusion at 1900 units/hr Will check HL in 6 hr after start of infusion CBC daily while on heparin  Otelia Sergeant, PharmD, [facility] 06/13/2023 11:17 PM

## 2023-06-13 NOTE — H&P (Signed)
History and Physical    Patient: Ronald Pratt:[id number] DOB: [birth year] DOA: 06/13/2023 DOS: the patient was seen and examined on 06/13/2023 PCP: Barbette Reichmann, MD  Patient coming from: Home  Chief Complaint:  Chief Complaint  Patient presents with   Shortness of Breath    HPI: Ronald Pratt is a 47 y.o. male with medical history significant for COPD, nicotine dependence, OSA not on CPAP, HFrEF(EF 45 to 50% 06/2022) who presents today with a 2-day history of progressively worsening shortness of breath associated with lightheadedness and feeling like he is going to pass out when ambulating.  He denies cough, fever or chills.  He has no chest pain.  He has a cough that is nonproductive and has no fever or chills.  He denies lower extremity pain or swelling. ED course and data review: Tachycardic to 108 on arrival O2 sat 94 on room air at rest 90 with ambulation.  Other vitals unremarkable. Labs notable for troponin of 27 and BNP 801 with D-dimer 3.82 WBC 14.6, creatinine1.65, baseline around 1.29 EKG, personally viewed and interpreted showing sinus tachycardia at 106 with T wave inversion in V3 V4 V5 CT angio chest showing large burden of PE originating in the bilateral main pulmonary arteries extending into the lobar segmental and subsegmental branches with right heart strain, RV/LV ratio of 1.7 Patient started on a heparin infusion and given a dose of Lasix.   Hospitalist consulted for admission.   Review of Systems: As mentioned in the history of present illness. All other systems reviewed and are negative.  Past Medical History:  Diagnosis Date   CHF (congestive heart failure) (HCC)    Diverticulosis large intestine w/o perforation or abscess w/o bleeding 12/11/2016   Dysrhythmia    tacchycardia new..  put on metoprolol by dr. Juliann Pares for surgery   GERD (gastroesophageal reflux disease)    Gout    Hypertension    Obstructive sleep apnea    Seizures (HCC)    last one was 8  years ago. alcoholic seizures. stopped drinking.   Umbilical hernia    Past Surgical History:  Procedure Laterality Date   ESOPHAGOGASTRODUODENOSCOPY ENDOSCOPY  2014   INSERTION OF MESH N/A 01/04/2017   Procedure: INSERTION OF MESH;  Surgeon: Henrene Dodge, MD;  Location: [facility] ORS;  Service: General;  Laterality: N/A;   UMBILICAL HERNIA REPAIR N/A 01/04/2017   Procedure: HERNIA REPAIR UMBILICAL ADULT with mesh;  Surgeon: Henrene Dodge, MD;  Location: [facility] ORS;  Service: General;  Laterality: N/A;   Social History:  reports that he has been smoking cigarettes. He has a 20 pack-year smoking history. He has never used smokeless tobacco. He reports that he does not drink alcohol and does not use drugs.  No Known Allergies  Family History  Problem Relation Age of Onset   Breast cancer Mother    Heart disease Mother    Prostate cancer Father    Colon cancer Father    Diabetes Sister    Heart disease Sister     Prior to Admission medications   Medication Sig Start Date End Date Taking? Authorizing Provider  allopurinol (ZYLOPRIM) 300 MG tablet Take 300 mg by mouth daily.     [provider]  aspirin EC 81 MG EC tablet Take 1 tablet (81 mg total) by mouth daily. 02/29/20   Arnetha Courser, MD  colchicine 0.6 MG tablet Take 0.6-1.2 mg by mouth See admin instructions. Take 2 tablets (1.2mg ) by mouth at onset of gout flare -  take 1 additional tablet (0.6mg ) by mouth after an hour if needed    [provider]  losartan (COZAAR) 100 MG tablet Take 1 tablet (100 mg total) by mouth daily. 02/04/18   Alford Highland, MD  metoprolol succinate (TOPROL-XL) 25 MG 24 hr tablet Take 1 tablet (25 mg total) by mouth daily. 02/29/20   Arnetha Courser, MD  omeprazole (PRILOSEC OTC) 20 MG tablet Take 20 mg by mouth daily.    [provider]  potassium chloride SA (K-DUR,KLOR-CON) 20 MEQ tablet Take 1 tablet (20 mEq total) by mouth daily. 08/14/18   Delma Freeze, FNP  torsemide (DEMADEX) 20  MG tablet Take 2 tablets (40 mg total) by mouth daily. 02/28/20   Arnetha Courser, MD    Physical Exam: Vitals:   06/13/23 1757 06/13/23 2010 06/13/23 2119 06/13/23 2321  BP: (!) 153/126  (!) 139/94   Pulse: 75  (!) 116   Resp: 19  (!) 22   Temp:  98.1 F (36.7 C) 98 F (36.7 C)   TempSrc:  Oral    SpO2: 96%  92%   Weight:    (!) 152 kg  Height:    6\' 1"  (1.854 m)   Physical Exam Vitals and nursing note reviewed.  Constitutional:      General: He is not in acute distress.    Appearance: He is morbidly obese.     Interventions: Nasal cannula in place.     Comments: Conversational dyspnea  HENT:     Head: Normocephalic and atraumatic.  Cardiovascular:     Rate and Rhythm: Normal rate and regular rhythm.     Heart sounds: Normal heart sounds.  Pulmonary:     Effort: Tachypnea present.     Breath sounds: Normal breath sounds.  Abdominal:     Palpations: Abdomen is soft.     Tenderness: There is no abdominal tenderness.  Neurological:     Mental Status: Mental status is at baseline.     Labs on Admission: I have personally reviewed following labs and imaging studies  CBC: Recent Labs  Lab 06/13/23 1549  WBC 14.6*  HGB 17.2*  HCT 51.6  MCV 94.9  PLT 240   Basic Metabolic Panel: Recent Labs  Lab 06/13/23 1549  NA 138  K 3.4*  CL 97*  CO2 27  GLUCOSE 111*  BUN 24*  CREATININE 1.65*  CALCIUM 9.0   GFR: Estimated Creatinine Clearance: 86 mL/min (A) (by C-G formula based on SCr of 1.65 mg/dL (H)). Liver Function Tests: No results for input(s): "AST", "ALT", "ALKPHOS", "BILITOT", "PROT", "ALBUMIN" in the last 168 hours. No results for input(s): "LIPASE", "AMYLASE" in the last 168 hours. No results for input(s): "AMMONIA" in the last 168 hours. Coagulation Profile: No results for input(s): "INR", "PROTIME" in the last 168 hours. Cardiac Enzymes: No results for input(s): "CKTOTAL", "CKMB", "CKMBINDEX", "TROPONINI" in the last 168 hours. BNP (last 3  results) No results for input(s): "PROBNP" in the last 8760 hours. HbA1C: No results for input(s): "HGBA1C" in the last 72 hours. CBG: No results for input(s): "GLUCAP" in the last 168 hours. Lipid Profile: No results for input(s): "CHOL", "HDL", "LDLCALC", "TRIG", "CHOLHDL", "LDLDIRECT" in the last 72 hours. Thyroid Function Tests: No results for input(s): "TSH", "T4TOTAL", "FREET4", "T3FREE", "THYROIDAB" in the last 72 hours. Anemia Panel: No results for input(s): "VITAMINB12", "FOLATE", "FERRITIN", "TIBC", "IRON", "RETICCTPCT" in the last 72 hours. Urine analysis:    Component Value Date/Time   COLORURINE YELLOW (A) 12/11/2016  1610   APPEARANCEUR CLEAR (A) 12/11/2016 0633   LABSPEC 1.015 12/11/2016 0633   PHURINE 6.0 12/11/2016 0633   GLUCOSEU NEGATIVE 12/11/2016 0633   HGBUR NEGATIVE 12/11/2016 0633   BILIRUBINUR NEGATIVE 12/11/2016 0633   KETONESUR NEGATIVE 12/11/2016 0633   PROTEINUR NEGATIVE 12/11/2016 0633   NITRITE NEGATIVE 12/11/2016 0633   LEUKOCYTESUR NEGATIVE 12/11/2016 9604    Radiological Exams on Admission: CT Angio Chest PE W/Cm &/Or Wo Cm  Result Date: 06/13/2023 CLINICAL DATA:  Shortness of breath EXAM: CT ANGIOGRAPHY CHEST WITH CONTRAST TECHNIQUE: Multidetector CT imaging of the chest was performed using the standard protocol during bolus administration of intravenous contrast. Multiplanar CT image reconstructions and MIPs were obtained to evaluate the vascular anatomy. RADIATION DOSE REDUCTION: This exam was performed according to the departmental dose-optimization program which includes automated exposure control, adjustment of the mA and/or kV according to patient size and/or use of iterative reconstruction technique. CONTRAST:  OMNIPAQUE IOHEXOL 350 MG/ML SOLN COMPARISON:  Chest radiograph 06/13/2023 and CTA chest 02/03/2018 FINDINGS: Cardiovascular: Large bilateral pulmonary emboli originating within the right and left main pulmonary artery extending into  multiple lobar, segmental, and subsegmental branches. There is evidence of right heart strain with RV/LV ratio of 1.7. No pericardial effusion. Mediastinum/Nodes: Trachea and esophagus are unremarkable. No thoracic adenopathy Lungs/Pleura: No focal consolidation, pleural effusion, or pneumothorax. Upper Abdomen: No acute abnormality. Musculoskeletal: No acute fracture. Review of the MIP images confirms the above findings. IMPRESSION: Large burden of pulmonary emboli originating in the bilateral main pulmonary arteries extending into the lobar, segmental and subsegmental branches. Right heart strain with RV/LV ratio of 1.7. Critical Value/emergent results were called by telephone at the time of interpretation on 06/13/2023 at 11:23 pm to provider [facility] , who verbally acknowledged these results. Electronically Signed   By: Minerva Fester M.D.   On: 06/13/2023 23:31   DG Chest 2 View  Result Date: 06/13/2023 CLINICAL DATA:  Shortness of breath. EXAM: CHEST - 2 VIEW COMPARISON:  Radiograph 02/27/2020 FINDINGS: Borderline cardiomegaly. Unchanged mediastinal contours. No focal airspace disease. No pleural fluid or pneumothorax. No pulmonary edema. No acute osseous findings. IMPRESSION: Borderline cardiomegaly. No acute pulmonary process. Electronically Signed   By: Narda Rutherford M.D.   On: 06/13/2023 16:40     Data Reviewed: Relevant notes from primary care and specialist visits, past discharge summaries as available in EHR, including Care Everywhere. 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  Tachycardic to 116 and tachypneic to 22 but with normal blood pressure and O2 sat 94% on room air Continue heparin infusion Discussed with vascular surgeon, Dr. Wyn Quaker: States patient's vitals are relatively stable so okay to hold off till in the a.m. for intervention Also received Lasix IV in the ED due to cor pulmonale Close monitoring in stepdown Supplemental oxygen  Chronic systolic CHF (congestive heart failure) (HCC) Patient with mild exacerbation related to RV strain from massive PE Received Lasix in the  ED Will continue IV Lasix To ensure continued hemodynamic stability, we will hold off on losartan and metoprolol for now Daily weights with intake and output monitoring Will get echocardiogram, last EF 45 to 50% September 2023   COPD (chronic obstructive pulmonary disease) (HCC) Not acutely exacerbated DuoNebs as needed  Obstructive sleep apnea Not currently on CPAP  Obesity, Class III, BMI 40-49.9 (morbid obesity) (HCC) Complicating factor to overall prognosis and care  Benign essential hypertension Patient is normotensive Holding off on antihypertensives for show for hemodynamic decompensation with massive PE  Tobacco use disorder Nicotine patch        DVT prophylaxis: Heparin infusion  Consults: Vascular, Dr. Wyn Quaker  Advance Care Planning:   Code Status: Prior   Family Communication: none  Disposition Plan: Back to previous home environment  Severity of Illness: The appropriate patient status for this patient is INPATIENT. Inpatient status is judged to be reasonable and necessary in order to provide the required intensity of service to ensure the patient's safety. The patient's presenting symptoms, physical exam findings, and initial radiographic and  laboratory data in the context of their chronic comorbidities is felt to place them at high risk for further clinical deterioration. Furthermore, it is not anticipated that the patient will be medically stable for discharge from the hospital within 2 midnights of admission.   * I certify that at the point of admission it is my clinical judgment that the patient will require inpatient hospital care spanning beyond 2 midnights from the point of admission due to high intensity of service, high risk for further deterioration and high frequency of surveillance required.*  Author: Andris Baumann, MD 06/13/2023 11:42 PM  For on call review [email or web address].

## 2023-06-13 NOTE — ED Triage Notes (Signed)
Pt presents to ED with c/o of SOB. Pt states he does smoke daily-1 pack a day-. Pt states possible HX of COPD. Pt states SOB since Friday. Pt is  A&Ox4.

## 2023-06-13 NOTE — ED Provider Notes (Signed)
[facility] Provider Note    Event Date/Time   First MD Initiated Contact with Patient 06/13/23 1959     (approximate)   History   Shortness of Breath   HPI  Ronald Pratt is a 47 y.o. male past medical history significant for hypertension, GERD, gout, OSA not on CPAP, HFpEF, daily tobacco use, who presents to the emergency department with shortness of breath.  States that over the past couple of days he has had progressively worsening shortness of breath.  States that he is able to take about 15 steps and then gets short of breath.  Denies any chest pain.  Endorses compliance with his home medications.  Denies any falls or trauma.  No history of DVT or PE.  Ongoing daily tobacco use.  Does endorse a cough but no production of sputum.     Physical Exam   Triage Vital Signs: ED Triage Vitals  Encounter Vitals Group     BP 06/13/23 1540 115/78     Systolic BP Percentile --      Diastolic BP Percentile --      Pulse Rate 06/13/23 1540 (!) 108     Resp 06/13/23 1540 20     Temp 06/13/23 1540 98 F (36.7 C)     Temp Source 06/13/23 1540 Oral     SpO2 06/13/23 1540 94 %     Weight --      Height --      Head Circumference --      Peak Flow --      Pain Score 06/13/23 1542 0     Pain Loc --      Pain Education --      Exclude from Growth Chart --     Most recent vital signs: Vitals:   06/13/23 2010 06/13/23 2119  BP:  (!) 139/94  Pulse:  (!) 116  Resp:  (!) 22  Temp: 98.1 F (36.7 C) 98 F (36.7 C)  SpO2:  92%    Physical Exam Constitutional:      Appearance: He is well-developed.  HENT:     Head: Atraumatic.  Eyes:     Conjunctiva/sclera: Conjunctivae normal.  Cardiovascular:     Rate and Rhythm: Regular rhythm.  Pulmonary:     Effort: No respiratory distress.     Breath sounds: Rales (Bilateral lower lung fields) present.     Comments: Speaking in full sentences.  No wheezing. Musculoskeletal:     Cervical back: Normal range of  motion.     Right lower leg: No edema.     Left lower leg: No edema.  Skin:    General: Skin is warm.     Capillary Refill: Capillary refill takes less than 2 seconds.  Neurological:     General: No focal deficit present.     Mental Status: He is alert. Mental status is at baseline.     IMPRESSION / MDM / ASSESSMENT AND PLAN / ED COURSE  I reviewed the triage vital signs and the nursing notes.  Differential diagnosis including COPD exacerbation, CHF exacerbation, pneumonia, COVID, pulmonary hypertension, pulmonary embolism, ACS  On chart review patient has been followed by Dr. Juliann Pares, followed by heart failure clinic.  History of HFpEF.  Takes Lasix 40 mg twice daily.  Patient states that he has been taking medications differently and taking Lasix 4 pills all at 1 time secondary to his work.  EKG  I, Corena Herter, the attending physician, personally  viewed and interpreted this ECG.  EKG with sinus tachycardia with infrequent PVCs.  Heart rate 106.  QTc 496.  Nonspecific ST changes.  No significant change when compared to prior.   RADIOLOGY  my interpretation of imaging: Chest x-ray with megaly but no signs of pulmonary edema.  No signs of pneumonia  On my evaluation of CTA signs of bilateral pulmonary embolism with findings concerning for right heart strain.  Waiting for CTA to be read by radiologist.  LABS (all labs ordered are listed, but only abnormal results are displayed) Labs interpreted as -    Labs Reviewed  BASIC METABOLIC PANEL - Abnormal; Notable for the following components:      Result Value   Potassium 3.4 (*)    Chloride 97 (*)    Glucose, Bld 111 (*)    BUN 24 (*)    Creatinine, Ser 1.65 (*)    GFR, Estimated 52 (*)    All other components within normal limits  CBC - Abnormal; Notable for the following components:   WBC 14.6 (*)    Hemoglobin 17.2 (*)    All other components within normal limits  BRAIN NATRIURETIC PEPTIDE - Abnormal; Notable for  the following components:   B Natriuretic Peptide 801.7 (*)    All other components within normal limits  D-DIMER, QUANTITATIVE - Abnormal; Notable for the following components:   D-Dimer, Quant 3.82 (*)    All other components within normal limits  TROPONIN I (HIGH SENSITIVITY) - Abnormal; Notable for the following components:   Troponin I (High Sensitivity) 27 (*)    All other components within normal limits  TROPONIN I (HIGH SENSITIVITY) - Abnormal; Notable for the following components:   Troponin I (High Sensitivity) 27 (*)    All other components within normal limits  SARS CORONAVIRUS 2 BY RT PCR     MDM  Serial troponins stable at 27.  BNP elevated at 800.  Low risk Wells criteria and elevated D-dimer so obtaining CTA to evaluate for PE.  On my evaluation of CTA -bilateral pulmonary embolism with concern for right heart strain.  Mildly elevated troponin and BNP.  No hypotension.  No oxygen requirement.  Consulted hospitalist for admission.  Started on heparin bolus and infusion.     PROCEDURES:  Critical Care performed: yes  .Critical Care  Performed by: Corena Herter, MD Authorized by: Corena Herter, MD   Critical care provider statement:    Critical care time (minutes):  45   Critical care time was exclusive of:  Separately billable procedures and treating other patients   Critical care was necessary to treat or prevent imminent or life-threatening deterioration of the following conditions:  Cardiac failure   Critical care was time spent personally by me on the following activities:  Development of treatment plan with patient or surrogate, discussions with consultants, evaluation of patient's response to treatment, examination of patient, ordering and review of laboratory studies, ordering and review of radiographic studies, ordering and performing treatments and interventions, pulse oximetry, re-evaluation of patient's condition and review of old charts   Patient's  presentation is most consistent with acute presentation with potential threat to life or bodily function.   MEDICATIONS ORDERED IN ED: Medications  furosemide (LASIX) injection 40 mg (40 mg Intravenous Given 06/13/23 2056)  iohexol (OMNIPAQUE) 350 MG/ML injection 100 mL (100 mLs Intravenous Contrast Given 06/13/23 2245)    FINAL CLINICAL IMPRESSION(S) / ED DIAGNOSES   Final diagnoses:  SOB (shortness of breath)  Acute  pulmonary embolism with acute cor pulmonale, unspecified pulmonary embolism type (HCC)     Rx / DC Orders   ED Discharge Orders     None        Note:  This document was prepared using Dragon voice recognition software and may include unintentional dictation errors.   Corena Herter, MD 06/13/23 2310

## 2023-06-13 NOTE — Assessment & Plan Note (Signed)
Not currently on CPAP

## 2023-06-13 NOTE — ED Notes (Signed)
Pt O2 sat at 88% with a good pleth. Pt placed on 2L via nasal cannula. Provider notified.

## 2023-06-13 NOTE — Assessment & Plan Note (Signed)
Patient is normotensive Holding off on antihypertensives for show for hemodynamic decompensation with massive PE

## 2023-06-14 ENCOUNTER — Encounter
Admission: EM | Disposition: A | Discharge status: Home / Self Care | Payer: Self-pay | Source: Home / Self Care | Attending: Internal Medicine

## 2023-06-14 ENCOUNTER — Encounter: Payer: Self-pay | Admitting: Internal Medicine

## 2023-06-14 ENCOUNTER — Inpatient Hospital Stay: Payer: Managed Care, Other (non HMO)

## 2023-06-14 ENCOUNTER — Inpatient Hospital Stay (HOSPITAL_COMMUNITY)
Admit: 2023-06-14 | Discharge: 2023-06-14 | Disposition: A | Discharge status: Home / Self Care | Payer: Managed Care, Other (non HMO) | Attending: Vascular Surgery | Admitting: Vascular Surgery

## 2023-06-14 DIAGNOSIS — I2699 Other pulmonary embolism without acute cor pulmonale: Secondary | ICD-10-CM | POA: Diagnosis not present

## 2023-06-14 DIAGNOSIS — R0602 Shortness of breath: Secondary | ICD-10-CM

## 2023-06-14 DIAGNOSIS — J989 Respiratory disorder, unspecified: Secondary | ICD-10-CM | POA: Diagnosis not present

## 2023-06-14 DIAGNOSIS — I2609 Other pulmonary embolism with acute cor pulmonale: Secondary | ICD-10-CM

## 2023-06-14 DIAGNOSIS — E119 Type 2 diabetes mellitus without complications: Secondary | ICD-10-CM

## 2023-06-14 DIAGNOSIS — I2602 Saddle embolus of pulmonary artery with acute cor pulmonale: Secondary | ICD-10-CM | POA: Diagnosis not present

## 2023-06-14 HISTORY — PX: PULMONARY THROMBECTOMY: CATH118295

## 2023-06-14 LAB — HEPARIN LEVEL (UNFRACTIONATED)
Heparin Unfractionated: 0.54 [IU]/mL (ref 0.30–0.70)
Heparin Unfractionated: 0.27 [IU]/mL — ABNORMAL LOW (ref 0.30–0.70)
Heparin Unfractionated: 0.67 [IU]/mL (ref 0.30–0.70)

## 2023-06-14 LAB — GLUCOSE, CAPILLARY
Glucose-Capillary: 93 mg/dL (ref 70–99)
Glucose-Capillary: 100 mg/dL — ABNORMAL HIGH (ref 70–99)
Glucose-Capillary: 96 mg/dL (ref 70–99)

## 2023-06-14 LAB — HEMOGLOBIN A1C
Hgb A1c MFr Bld: 7.4 % — ABNORMAL HIGH (ref 4.8–5.6)
Mean Plasma Glucose: 165.68 mg/dL

## 2023-06-14 LAB — HIV ANTIBODY (ROUTINE TESTING W REFLEX): HIV Screen 4th Generation wRfx: NONREACTIVE

## 2023-06-14 LAB — ANTITHROMBIN III: AntiThromb III Func: 83 % (ref 75–120)

## 2023-06-14 LAB — MRSA NEXT GEN BY PCR, NASAL: MRSA by PCR Next Gen: NOT DETECTED

## 2023-06-14 SURGERY — PULMONARY THROMBECTOMY
Anesthesia: Moderate Sedation | Laterality: Bilateral

## 2023-06-14 MED ORDER — CEFAZOLIN IN SODIUM CHLORIDE 3-0.9 GM/100ML-% IV SOLN
3.0000 g | INTRAVENOUS | Status: DC
  Filled 2023-06-14: qty 100

## 2023-06-14 MED ORDER — INSULIN ASPART 100 UNIT/ML IJ SOLN: 0.0000 [IU] | Freq: Every day | INTRAMUSCULAR | Status: DC

## 2023-06-14 MED ORDER — LIDOCAINE-EPINEPHRINE (PF) 1 %-1:200000 IJ SOLN
INTRAMUSCULAR | Status: DC | PRN
  Administered 2023-06-14: 10 mL

## 2023-06-14 MED ORDER — CEFAZOLIN SODIUM-DEXTROSE 2-4 GM/100ML-% IV SOLN
INTRAVENOUS | Status: AC
  Filled 2023-06-14: qty 100

## 2023-06-14 MED ORDER — MIDAZOLAM HCL 5 MG/5ML IJ SOLN
INTRAMUSCULAR | Status: AC
  Filled 2023-06-14: qty 5

## 2023-06-14 MED ORDER — HEPARIN BOLUS VIA INFUSION
1750.0000 [IU] | Freq: Once | INTRAVENOUS | Status: AC
  Administered 2023-06-14: 1750 [IU] via INTRAVENOUS
  Filled 2023-06-14: qty 1750

## 2023-06-14 MED ORDER — ALTEPLASE 1 MG/ML SYRINGE FOR VASCULAR PROCEDURE
INTRAMUSCULAR | Status: DC | PRN
  Administered 2023-06-14 (×2): 4 mg via INTRA_ARTERIAL

## 2023-06-14 MED ORDER — CHLORHEXIDINE GLUCONATE CLOTH 2 % EX PADS
6.0000 | MEDICATED_PAD | Freq: Every day | CUTANEOUS | Status: DC
  Administered 2023-06-14 – 2023-06-15 (×2): 6 via TOPICAL

## 2023-06-14 MED ORDER — CEFAZOLIN SODIUM-DEXTROSE 2-4 GM/100ML-% IV SOLN
2.0000 g | INTRAVENOUS | Status: AC
  Administered 2023-06-14: 2 g via INTRAVENOUS
  Filled 2023-06-14: qty 100

## 2023-06-14 MED ORDER — HEPARIN SODIUM (PORCINE) 1000 UNIT/ML IJ SOLN
INTRAMUSCULAR | Status: DC | PRN
  Administered 2023-06-14: 3000 [IU] via INTRAVENOUS

## 2023-06-14 MED ORDER — MIDAZOLAM HCL 2 MG/ML PO SYRP: 8.0000 mg | ORAL_SOLUTION | Freq: Once | ORAL | Status: DC | PRN

## 2023-06-14 MED ORDER — IODIXANOL 320 MG/ML IV SOLN
INTRAVENOUS | Status: DC | PRN
  Administered 2023-06-14: 75 mL

## 2023-06-14 MED ORDER — FUROSEMIDE 10 MG/ML IJ SOLN
60.0000 mg | Freq: Once | INTRAMUSCULAR | Status: AC
  Administered 2023-06-14: 60 mg via INTRAVENOUS
  Filled 2023-06-14: qty 6

## 2023-06-14 MED ORDER — ONDANSETRON HCL 4 MG/2ML IJ SOLN: 4.0000 mg | Freq: Four times a day (QID) | INTRAMUSCULAR | Status: DC | PRN

## 2023-06-14 MED ORDER — METHYLPREDNISOLONE SODIUM SUCC 125 MG IJ SOLR: 125.0000 mg | Freq: Once | INTRAMUSCULAR | Status: DC | PRN

## 2023-06-14 MED ORDER — HEPARIN SODIUM (PORCINE) 1000 UNIT/ML IJ SOLN
INTRAMUSCULAR | Status: AC
  Filled 2023-06-14: qty 10

## 2023-06-14 MED ORDER — DIPHENHYDRAMINE HCL 50 MG/ML IJ SOLN: 50.0000 mg | Freq: Once | INTRAMUSCULAR | Status: DC | PRN

## 2023-06-14 MED ORDER — HYDROMORPHONE HCL 1 MG/ML IJ SOLN: 1.0000 mg | Freq: Once | INTRAMUSCULAR | Status: DC | PRN

## 2023-06-14 MED ORDER — FENTANYL CITRATE (PF) 100 MCG/2ML IJ SOLN
INTRAMUSCULAR | Status: AC
  Filled 2023-06-14: qty 2

## 2023-06-14 MED ORDER — ALTEPLASE 2 MG IJ SOLR
INTRAMUSCULAR | Status: AC
  Filled 2023-06-14: qty 8

## 2023-06-14 MED ORDER — ALLOPURINOL 100 MG PO TABS
300.0000 mg | ORAL_TABLET | Freq: Every day | ORAL | Status: DC
  Administered 2023-06-15 – 2023-06-16 (×2): 300 mg via ORAL
  Filled 2023-06-14: qty 3
  Filled 2023-06-14: qty 1
  Filled 2023-06-14: qty 3

## 2023-06-14 MED ORDER — SODIUM CHLORIDE 0.9 % IV SOLN: INTRAVENOUS | Status: DC

## 2023-06-14 MED ORDER — MIDAZOLAM HCL 2 MG/2ML IJ SOLN
INTRAMUSCULAR | Status: DC | PRN
  Administered 2023-06-14: .5 mg via INTRAVENOUS
  Administered 2023-06-14: 2 mg via INTRAVENOUS

## 2023-06-14 MED ORDER — FENTANYL CITRATE PF 50 MCG/ML IJ SOSY: 12.5000 ug | PREFILLED_SYRINGE | Freq: Once | INTRAMUSCULAR | Status: DC | PRN

## 2023-06-14 MED ORDER — FAMOTIDINE 20 MG PO TABS: 40.0000 mg | ORAL_TABLET | Freq: Once | ORAL | Status: DC | PRN

## 2023-06-14 MED ORDER — INSULIN ASPART 100 UNIT/ML IJ SOLN
0.0000 [IU] | Freq: Three times a day (TID) | INTRAMUSCULAR | Status: DC
  Administered 2023-06-15: 3 [IU] via SUBCUTANEOUS
  Filled 2023-06-14: qty 1

## 2023-06-14 MED ORDER — FENTANYL CITRATE (PF) 100 MCG/2ML IJ SOLN
INTRAMUSCULAR | Status: DC | PRN
  Administered 2023-06-14: 50 ug via INTRAVENOUS
  Administered 2023-06-14: 25 ug via INTRAVENOUS

## 2023-06-14 MED ORDER — HEPARIN (PORCINE) IN NACL 1000-0.9 UT/500ML-% IV SOLN
INTRAVENOUS | Status: DC | PRN
  Administered 2023-06-14: 1000 mL

## 2023-06-14 SURGICAL SUPPLY — 17 items
CANISTER PENUMBRA ENGINE (MISCELLANEOUS) IMPLANT
CATH INFINITI 5FR ANG PIGTAIL (CATHETERS) IMPLANT
CATH INFINITI JR4 5F (CATHETERS) IMPLANT
CATH LIGHTNI FLASH 16XTORQ 100 (CATHETERS) IMPLANT
CATH LIGHTNING FLASH XTORQ 100 (CATHETERS) ×1
CLOSURE PERCLOSE PROSTYLE (VASCULAR PRODUCTS) IMPLANT
COVER PROBE ULTRASOUND 5X96 (MISCELLANEOUS) IMPLANT
GLIDEWIRE ADV .035X180CM (WIRE) IMPLANT
PACK ANGIOGRAPHY (CUSTOM PROCEDURE TRAY) ×1 IMPLANT
SHEATH BRITE TIP 6FRX11 (SHEATH) IMPLANT
SHEATH INTRO CHECKFLO 16F 13 (SHEATH) IMPLANT
SUT MNCRL AB 4-0 PS2 18 (SUTURE) IMPLANT
SUT PROLENE 0 CT 1 30 (SUTURE) IMPLANT
SYR MEDRAD MARK 7 150ML (SYRINGE) IMPLANT
TUBING CONTRAST HIGH PRESS 72 (TUBING) IMPLANT
WIRE GUIDERIGHT .035X150 (WIRE) IMPLANT
WIRE SUPRACORE 300CM (WIRE) IMPLANT

## 2023-06-14 NOTE — Interval H&P Note (Signed)
History and Physical Interval Note:  06/14/2023 5:32 PM  Ronald Pratt  has presented today for surgery, with the diagnosis of Pulmonary Embolism.  The various methods of treatment have been discussed with the patient and family. After consideration of risks, benefits and other options for treatment, the patient has consented to  Procedure(s): PULMONARY THROMBECTOMY (Bilateral) as a surgical intervention.  The patient's history has been reviewed, patient examined, no change in status, stable for surgery.  I have reviewed the patient's chart and labs.  Questions were answered to the patient's satisfaction.     Festus Barren

## 2023-06-14 NOTE — ED Notes (Signed)
Pt is NPO starting now.

## 2023-06-14 NOTE — ED Notes (Signed)
Report given to [facility] of ICU at this time.

## 2023-06-14 NOTE — Progress Notes (Signed)
Pt to transfer to 2A-241. Report given to [facility]. All belongings sent with pt. VSS. Pt transported via wheelchair.

## 2023-06-14 NOTE — Progress Notes (Signed)
Pt stated "he has OSA but doesn't wear CPAP at home".

## 2023-06-14 NOTE — Consult Note (Signed)
Hospital Consult    Reason for Consult:  Bilateral Pulmonary Embolisms Requesting Physician:  Dr Lindajo Royal MD MRN #:  [id number]  History of Present Illness: This is a 47 y.o. male with medical history significant for COPD, nicotine dependence, OSA not on CPAP, HFrEF(EF 45 to 50% 06/2022) who presents today with a 2-day history of progressively worsening shortness of breath associated with lightheadedness and feeling like he is going to pass out when ambulating.  He denies cough, fever or chills.  He has no chest pain.  He has a cough that is nonproductive and has no fever or chills.  He denies lower extremity pain or swelling.   Past Medical History:  Diagnosis Date   CHF (congestive heart failure) (HCC)    Diverticulosis large intestine w/o perforation or abscess w/o bleeding 12/11/2016   Dysrhythmia    tacchycardia new..  put on metoprolol by dr. 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   Umbilical hernia     Past Surgical History:  Procedure Laterality Date   ESOPHAGOGASTRODUODENOSCOPY ENDOSCOPY  2014   INSERTION OF MESH N/A 01/04/2017   Procedure: INSERTION OF MESH;  Surgeon: Henrene Dodge, MD;  Location: [facility] ORS;  Service: General;  Laterality: N/A;   UMBILICAL HERNIA REPAIR N/A 01/04/2017   Procedure: HERNIA REPAIR UMBILICAL ADULT with mesh;  Surgeon: Henrene Dodge, MD;  Location: [facility] ORS;  Service: General;  Laterality: N/A;    No Known Allergies  Prior to Admission medications   Medication Sig Start Date End Date Taking? Authorizing Provider  allopurinol (ZYLOPRIM) 300 MG tablet Take 300 mg by mouth daily.    Yes [provider]  aspirin EC 81 MG EC tablet Take 1 tablet (81 mg total) by mouth daily. 02/29/20  Yes Arnetha Courser, MD  colchicine 0.6 MG tablet Take 0.6-1.2 mg by mouth See admin instructions.  Take 2 tablets (1.2mg ) by mouth at onset of gout flare - take 1 additional tablet (0.6mg ) by mouth after an hour if needed   Yes [provider]  dapagliflozin propanediol (FARXIGA) 10 MG TABS tablet Take 1 tablet by mouth daily. 11/04/22 11/04/23 Yes [provider]  predniSONE (DELTASONE) 10 MG tablet Take by mouth daily-4, 4, 4, 3, 3, 3, 2, 2, 2, 1, 1, 1 and stop 05/30/23  Yes Hande, Vishwanath, MD  sacubitril-valsartan (ENTRESTO) 97-103 MG Take 1 tablet by mouth 2 (two) times daily. 11/04/22  Yes [provider]  Torsemide 40 MG TABS Take 80 mg by mouth daily. 07/01/22  Yes [provider]  losartan (COZAAR) 100 MG tablet Take 1 tablet (100 mg total) by mouth daily. 02/04/18   Alford Highland, MD  metoprolol succinate (TOPROL-XL) 25 MG 24 hr tablet Take 1 tablet (25 mg total) by mouth daily. 02/29/20   Arnetha Courser, MD  omeprazole (PRILOSEC OTC) 20 MG tablet Take 20 mg by mouth daily.    [provider]  potassium chloride SA (K-DUR,KLOR-CON) 20 MEQ tablet Take 1 tablet (20 mEq total) by mouth daily. 08/14/18   Delma Freeze, FNP    Social History   Socioeconomic History   Marital status: Divorced    Spouse name: Not on file   Number of children: Not on file   Years of education: Not on file   Highest education level:  Not on file  Occupational History   Not on file  Tobacco Use   Smoking status: Every Day    Current packs/day: 1.00    Average packs/day: 1 pack/day for 20.0 years (20.0 ttl pk-yrs)    Types: Cigarettes   Smokeless tobacco: Never  Substance and Sexual Activity   Alcohol use: No    Comment: SOBER SINCE 2010   Drug use: No   Sexual activity: Yes    Birth control/protection: None  Other Topics Concern   Not on file  Social History Narrative   Not on file   Social Determinants of Health   Financial Resource Strain: Not on file  Food Insecurity: Not on file  Transportation Needs: Not on file  Physical Activity: Not on file   Stress: Not on file  Social Connections: Not on file  Intimate Partner Violence: Not on file     Family History  Problem Relation Age of Onset   Breast cancer Mother    Heart disease Mother    Prostate cancer Father    Colon cancer Father    Diabetes Sister    Heart disease Sister     ROS: Otherwise negative unless mentioned in HPI  Physical Examination  Vitals:   06/14/23 0430 06/14/23 0444  BP: (!) 85/72 (!) 111/91  Pulse: 97 92  Resp: 16 17  Temp:  97.6 F (36.4 C)  SpO2: 93% 94%   Body mass index is 44.2 kg/m.  General:  WDWN in NAD Gait: Not observed HENT: WNL, normocephalic Pulmonary: normal labored breathing, with Rales in bilateral bases,  no rhonchi,  wheezing noted. Patient requires 4 liters nasal cannula oxygen  Cardiac: regular, Tachycardic in 110-120, without  Murmurs, rubs or gallops; without carotid bruits Abdomen: Positive bowel sounds throughout,  soft, NT/ND, no masses, Large obese abdomen Skin: without rashes Vascular Exam/Pulses: Palpable pulses throughout Extremities: without ischemic changes, without Gangrene , without cellulitis; without open wounds;  Musculoskeletal: no muscle wasting or atrophy  Neurologic: A&O X 3;  No focal weakness or paresthesias are detected; speech is fluent/normal Psychiatric:  The pt has Normal affect. Lymph:  Unremarkable  CBC    Component Value Date/Time   WBC 14.6 (H) 06/13/2023 1549   RBC 5.44 06/13/2023 1549   HGB 17.2 (H) 06/13/2023 1549   HCT 51.6 06/13/2023 1549   PLT 240 06/13/2023 1549   MCV 94.9 06/13/2023 1549   MCH 31.6 06/13/2023 1549   MCHC 33.3 06/13/2023 1549   RDW 12.3 06/13/2023 1549    BMET    Component Value Date/Time   NA 138 06/13/2023 1549   K 3.4 (L) 06/13/2023 1549   CL 97 (L) 06/13/2023 1549   CO2 27 06/13/2023 1549   GLUCOSE 111 (H) 06/13/2023 1549   BUN 24 (H) 06/13/2023 1549   CREATININE 1.65 (H) 06/13/2023 1549   CALCIUM 9.0 06/13/2023 1549   GFRNONAA 52 (L)  06/13/2023 1549   GFRAA >60 02/28/2020 0514    COAGS: Lab Results  Component Value Date   INR 1.1 06/13/2023     Non-Invasive Vascular Imaging:   EXAM:06/13/23 CT ANGIOGRAPHY CHEST WITH CONTRAST   TECHNIQUE: Multidetector CT imaging of the chest was performed using the standard protocol during bolus administration of intravenous contrast. Multiplanar CT image reconstructions and MIPs were obtained to evaluate the vascular anatomy.   RADIATION DOSE REDUCTION: This exam was performed according to the departmental dose-optimization program which includes automated exposure control, adjustment of the mA and/or kV according to  patient size and/or use of iterative reconstruction technique.   CONTRAST:  OMNIPAQUE IOHEXOL 350 MG/ML SOLN   COMPARISON:  Chest radiograph 06/13/2023 and CTA chest 02/03/2018   FINDINGS: Cardiovascular: Large bilateral pulmonary emboli originating within the right and left main pulmonary artery extending into multiple lobar, segmental, and subsegmental branches. There is evidence of right heart strain with RV/LV ratio of 1.7. No pericardial effusion.   Mediastinum/Nodes: Trachea and esophagus are unremarkable. No thoracic adenopathy   Lungs/Pleura: No focal consolidation, pleural effusion, or pneumothorax.   Upper Abdomen: No acute abnormality.   Musculoskeletal: No acute fracture.   Review of the MIP images confirms the above findings.   IMPRESSION: Large burden of pulmonary emboli originating in the bilateral main pulmonary arteries extending into the lobar, segmental and subsegmental branches. Right heart strain with RV/LV ratio of 1.7.  Statin:  No. Beta Blocker:  Yes.   Aspirin:  Yes.   ACEI:  No. ARB:  Yes.   CCB use:  No Other antiplatelets/anticoagulants:  No.    ASSESSMENT/PLAN: This is a 47 y.o. male with a significant medical history for COPD, OSA not on CPAP with heart failure who presents to [facility] urgency department  with a 2-day history of progressively worsening shortness of breath.  He associates this with lightheadedness and feeling like he is going to pass out when ambulating.  Upon workup patient had a CT scan of his chest which revealed a large pulmonary embolism with RV to LV ratio of 1.7.  Vascular surgery plans on taking the patient to the vascular lab later today for a pulmonary thrombectomy.  I discussed in detail today with the patient and family members the procedure, benefits, risk, and complications.  The patient and his family verbalized her understanding.  They would like to proceed as soon as possible.  I answered all her questions this morning.  Patient has been n.p.o. since midnight last night.  Patient is currently on a heparin drip and denies taking any other blood thinning medication.  Patient's current BUN and creatinine are 24/1.65.  Patient's estimated GFR is 52.  Also significant on his labs or B natruretic peptide at 801.7   -I discussed the plan in detail with Dr. Festus Barren MD and he agrees with the plan.   Marcie Bal [facility] 06/14/2023 7:20 AM

## 2023-06-14 NOTE — Progress Notes (Signed)
ANTICOAGULATION CONSULT NOTE  Pharmacy Consult for Heparin Infusion Indication: pulmonary embolus  No Known Allergies  Patient Measurements: Height: 6\' 1"  (185.4 cm) Weight: (!) 146 kg (321 lb 14 oz) IBW/kg (Calculated) : 79.9 Heparin Dosing Weight: 115.5 kg  Vital Signs: Temp: 97.9 F (36.6 C) (08/20 0915) Temp Source: Oral (08/20 0915) BP: 105/73 (08/20 1008) Pulse Rate: 60 (08/20 1100)  Labs: Recent Labs    06/13/23 1549 06/13/23 1757 06/13/23 2328 06/14/23 0649  HGB 17.2*  --   --   --   HCT 51.6  --   --   --   PLT 240  --   --   --   APTT  --   --  28  --   LABPROT  --   --  14.6  --   INR  --   --  1.1  --   HEPARINUNFRC  --   --   --  0.54  CREATININE 1.65*  --   --   --   TROPONINIHS 27* 27*  --   --     Estimated Creatinine Clearance: 84.1 mL/min (A) (by C-G formula based on SCr of 1.65 mg/dL (H)).   Medical History: Past Medical History:  Diagnosis Date   CHF (congestive heart failure) (HCC)    Diverticulosis large intestine w/o perforation or abscess w/o bleeding 12/11/2016   Dysrhythmia    tacchycardia new..  put on metoprolol by dr. 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   Umbilical hernia     Assessment: Ronald Pratt is a 47 y.o. male presenting with SOB. PMH significant for HTN, TUD, sCHF, COPD, diverticulosis. Patient was not on [facility] PTA per chart review. CTA with large burden of PE extending from bilateral middle pulmonary arteries all the way to the subsegmental branches with RV LV ratio of 1.7. Vascular evaluating for intervention. Pharmacy has been consulted to initiate and manage heparin infusion.   Baseline Labs: Hgb 17.2, Hct 51.6, Plt 240   Goal of Therapy:  Heparin level 0.3-0.7 units/ml Monitor platelets by anticoagulation protocol: Yes   Date Time HL Rate/Comment   8/20 0649 0.54 1900/therapeutic x1  Plan: heparin level subtherapeutic (no interruptions per RN) Bolus IV heparin 1750 units IV x 1 increase heparin infusion rate to 2150 units/hr Check heparin level in 6 hours after rate change Continue to monitor H&H and platelets daily while on heparin infusion   Lowella Bandy, PharmD Clinical Pharmacist 06/14/2023 12:43 PM

## 2023-06-14 NOTE — Progress Notes (Signed)
PROGRESS NOTE    Ronald Pratt  ZOX:[id number] DOB: [birth year] DOA: 06/13/2023 PCP: Barbette Reichmann, MD  Outpatient Specialists: cardiology    Brief Narrative:   From admission h and p  Ronald Pratt is a 47 y.o. male with medical history significant for COPD, nicotine dependence, OSA not on CPAP, HFrEF(EF 45 to 50% 06/2022) who presents today with a 2-day history of progressively worsening shortness of breath associated with lightheadedness and feeling like he is going to pass out when ambulating.  He denies cough, fever or chills.  He has no chest pain.  He has a cough that is nonproductive and has no fever or chills.  He denies lower extremity pain or swelling.   Assessment & Plan:   Principal Problem:   Acute saddle pulmonary embolism with acute cor pulmonale (HCC) Active Problems:   Saddle embolus of pulmonary artery with acute cor pulmonale (HCC)   Chronic systolic CHF (congestive heart failure) (HCC)   COPD (chronic obstructive pulmonary disease) (HCC)   Obstructive sleep apnea   Obesity, Class III, BMI 40-49.9 (morbid obesity) (HCC)   Benign essential hypertension   Tobacco use disorder   Seizures (HCC)   T2DM (type 2 diabetes mellitus) (HCC)  # Acute pulmonary embolus # Acute DVT Unprovoked. Large clot burden in bilateral main pulmonary arteries with evidence right heart strain. PVL with thrombus left popliteal vein. No family history but is in poor health. - continue heparin - tte pending - for thrombectomy/thrombolysis later today - thrombophilia w/u ordered (as this is unprovoked and age is relatively young)  # HFmrEF with acute exacerbation EF 45-50 most recent TTE, here with evidence right heart strain on CT, dyspnea - on torsemide 80 at home, will hold - received lasix 40 in the ER yesterday, will increase to 60 IV today - holding home entresto, metop, losartan, farxiga pending tte  # AKI Cr 1.65 from baseline of ~1.2 suspect 2/2 chf - closely monitor while  diuresing  # Acute hypoxic respiratory failure? On 3 liters when I entered his room but no documented hypoxia and O2 maintained 95-96 throughout my interview off oxygen - have asked nursing to maintain off o2 and only re-start if hypoxic - he does probably desat when sleeping given untreated osa  # OSA Reports not being on cpap due to insurance issues - cpap while inpatient  # COPD Quiescent, doesn't appear to be on maintenance meds - monitor  # T2DM Most recent A1c 8.5% last month - holding farxiga as above - SSI  # HTN Here bp wnl - holding home losart, metop for now  # Tobacco abuse - patch  # Obesity Noted     DVT prophylaxis: IV heparin Code Status: full Family Communication: sister and mother updated @ bedside 8/20  Level of care: Stepdown Status is: Inpatient Remains inpatient appropriate because: need for inpatient procedure, IV diuresis    Consultants:  vascular  Procedures: pending  Antimicrobials:  none    Subjective: Feeling fine when sitting still  Objective: Vitals:   06/14/23 0200 06/14/23 0330 06/14/23 0430 06/14/23 0444  BP: (!) 120/97 (!) 97/59 (!) 85/72 (!) 111/91  Pulse: 91 95 97 92  Resp: 15 20 16 17   Temp:    97.6 F (36.4 C)  TempSrc:      SpO2: 91% 93% 93% 94%  Weight:      Height:       No intake or output data in the 24 hours ending 06/14/23 0831 Filed  Weights   06/13/23 2321  Weight: (!) 152 kg    Examination:  General exam: Appears calm and comfortable  Respiratory system: Clear to auscultation. Respiratory effort normal. Cardiovascular system: S1 & S2 heard, RRR. Distant heart sounds. Gastrointestinal system: Abdomen is obese, soft and nontender. No organomegaly or masses felt.  Central nervous system: Alert and oriented. No focal neurological deficits. Extremities: Symmetric 5 x 5 power. Trace LE edema Skin: No rashes, lesions or ulcers Psychiatry: Judgement and insight appear normal. Mood & affect  appropriate.     Data Reviewed: I have personally reviewed following labs and imaging studies  CBC: Recent Labs  Lab 06/13/23 1549  WBC 14.6*  HGB 17.2*  HCT 51.6  MCV 94.9  PLT 240   Basic Metabolic Panel: Recent Labs  Lab 06/13/23 1549  NA 138  K 3.4*  CL 97*  CO2 27  GLUCOSE 111*  BUN 24*  CREATININE 1.65*  CALCIUM 9.0   GFR: Estimated Creatinine Clearance: 86 mL/min (A) (by C-G formula based on SCr of 1.65 mg/dL (H)). Liver Function Tests: No results for input(s): "AST", "ALT", "ALKPHOS", "BILITOT", "PROT", "ALBUMIN" in the last 168 hours. No results for input(s): "LIPASE", "AMYLASE" in the last 168 hours. No results for input(s): "AMMONIA" in the last 168 hours. Coagulation Profile: Recent Labs  Lab 06/13/23 2328  INR 1.1   Cardiac Enzymes: No results for input(s): "CKTOTAL", "CKMB", "CKMBINDEX", "TROPONINI" in the last 168 hours. BNP (last 3 results) No results for input(s): "PROBNP" in the last 8760 hours. HbA1C: No results for input(s): "HGBA1C" in the last 72 hours. CBG: No results for input(s): "GLUCAP" in the last 168 hours. Lipid Profile: No results for input(s): "CHOL", "HDL", "LDLCALC", "TRIG", "CHOLHDL", "LDLDIRECT" in the last 72 hours. Thyroid Function Tests: No results for input(s): "TSH", "T4TOTAL", "FREET4", "T3FREE", "THYROIDAB" in the last 72 hours. Anemia Panel: No results for input(s): "VITAMINB12", "FOLATE", "FERRITIN", "TIBC", "IRON", "RETICCTPCT" in the last 72 hours. Urine analysis:    Component Value Date/Time   COLORURINE YELLOW (A) 12/11/2016 0633   APPEARANCEUR CLEAR (A) 12/11/2016 0633   LABSPEC 1.015 12/11/2016 0633   PHURINE 6.0 12/11/2016 0633   GLUCOSEU NEGATIVE 12/11/2016 0633   HGBUR NEGATIVE 12/11/2016 0633   BILIRUBINUR NEGATIVE 12/11/2016 0633   KETONESUR NEGATIVE 12/11/2016 0633   PROTEINUR NEGATIVE 12/11/2016 0633   NITRITE NEGATIVE 12/11/2016 0633   LEUKOCYTESUR NEGATIVE 12/11/2016 4098   Sepsis  Labs: @LABRCNTIP (procalcitonin:4,lacticidven:4)  ) Recent Results (from the past 240 hour(s))  SARS Coronavirus 2 by RT PCR (hospital order, performed in [facility] hospital lab) *cepheid single result test* Anterior Nasal Swab     Status: None   Collection Time: 06/13/23  5:57 PM   Specimen: Anterior Nasal Swab  Result Value Ref Range Status   SARS Coronavirus 2 by RT PCR NEGATIVE NEGATIVE Final    Comment: (NOTE) SARS-CoV-2 target nucleic acids are NOT DETECTED.  The SARS-CoV-2 RNA is generally detectable in upper and lower respiratory specimens during the acute phase of infection. The lowest concentration of SARS-CoV-2 viral copies this assay can detect is 250 copies / mL. A negative result does not preclude SARS-CoV-2 infection and should not be used as the sole basis for treatment or other patient management decisions.  A negative result may occur with improper specimen collection / handling, submission of specimen other than nasopharyngeal swab, presence of viral mutation(s) within the areas targeted by this assay, and inadequate number of viral copies (<250 copies / mL). A negative result  must be combined with clinical observations, patient history, and epidemiological information.  Fact Sheet for Patients:   RoadLapTop.co.za  Fact Sheet for Healthcare Providers: http://kim-miller.com/  This test is not yet approved or  cleared by the [place] FDA and has been authorized for detection and/or diagnosis of SARS-CoV-2 by FDA under an Emergency Use Authorization (EUA).  This EUA will remain in effect (meaning this test can be used) for the duration of the COVID-19 declaration under Section 564(b)(1) of the Act, 21 U.S.C. section 360bbb-3(b)(1), unless the authorization is terminated or revoked sooner.  Performed at [facility], [address], [place], [place] [address]          Radiology Studies: US  Venous Img Lower Bilateral (DVT)  Result Date: 06/14/2023 CLINICAL DATA:  Pulmonary embolism.  Assess for residual DVT. EXAM: BILATERAL LOWER EXTREMITY VENOUS DOPPLER ULTRASOUND TECHNIQUE: Gray-scale sonography with graded compression, as well as color Doppler and duplex ultrasound were performed to evaluate the lower extremity deep venous systems from the level of the common femoral vein and including the common femoral, femoral, profunda femoral, popliteal and calf veins including the posterior tibial, peroneal and gastrocnemius veins when visible. The superficial great saphenous vein was also interrogated. Spectral Doppler was utilized to evaluate flow at rest and with distal augmentation maneuvers in the common femoral, femoral and popliteal veins. COMPARISON:  None Available. FINDINGS: RIGHT LOWER EXTREMITY Common Femoral Vein: No evidence of thrombus. Normal compressibility, respiratory phasicity and response to augmentation. Saphenofemoral Junction: No evidence of thrombus. Normal compressibility and flow on color Doppler imaging. Profunda Femoral Vein: No evidence of thrombus. Normal compressibility and flow on color Doppler imaging. Femoral Vein: No evidence of thrombus. Normal compressibility, respiratory phasicity and response to augmentation. Popliteal Vein: No evidence of thrombus. Normal compressibility, respiratory phasicity and response to augmentation. Calf Veins: No evidence of thrombus. Normal compressibility and flow on color Doppler imaging. Superficial Great Saphenous Vein: No evidence of thrombus. Normal compressibility. Venous Reflux:  None. Other Findings:  None. LEFT LOWER EXTREMITY Common Femoral Vein: No evidence of thrombus. Normal compressibility, respiratory phasicity and response to augmentation. Saphenofemoral Junction: No evidence of thrombus. Normal compressibility and flow on color Doppler imaging. Profunda Femoral Vein: No evidence of thrombus. Normal compressibility and flow  on color Doppler imaging. Femoral Vein: No evidence of thrombus. Normal compressibility, respiratory phasicity and response to augmentation. Popliteal Vein: Noncompressible. Thrombus is present within the popliteal vein. Minimal peripheral flow on color Doppler imaging. Calf Veins: No evidence of thrombus. Normal compressibility and flow on color Doppler imaging. Superficial Great Saphenous Vein: No evidence of thrombus. Normal compressibility. Venous Reflux:  None. Other Findings:  None. IMPRESSION: 1. Positive for nonocclusive thrombus within the left popliteal vein. 2. No evidence of deep venous thrombus within the right lower extremity. Electronically Signed   By: Malachy Moan M.D.   On: 06/14/2023 08:10   CT Angio Chest PE W/Cm &/Or Wo Cm  Result Date: 06/13/2023 CLINICAL DATA:  Shortness of breath EXAM: CT ANGIOGRAPHY CHEST WITH CONTRAST TECHNIQUE: Multidetector CT imaging of the chest was performed using the standard protocol during bolus administration of intravenous contrast. Multiplanar CT image reconstructions and MIPs were obtained to evaluate the vascular anatomy. RADIATION DOSE REDUCTION: This exam was performed according to the departmental dose-optimization program which includes automated exposure control, adjustment of the mA and/or kV according to patient size and/or use of iterative reconstruction technique. CONTRAST:  OMNIPAQUE IOHEXOL 350 MG/ML SOLN COMPARISON:  Chest radiograph 06/13/2023 and CTA  chest 02/03/2018 FINDINGS: Cardiovascular: Large bilateral pulmonary emboli originating within the right and left main pulmonary artery extending into multiple lobar, segmental, and subsegmental branches. There is evidence of right heart strain with RV/LV ratio of 1.7. No pericardial effusion. Mediastinum/Nodes: Trachea and esophagus are unremarkable. No thoracic adenopathy Lungs/Pleura: No focal consolidation, pleural effusion, or pneumothorax. Upper Abdomen: No acute abnormality.  Musculoskeletal: No acute fracture. Review of the MIP images confirms the above findings. IMPRESSION: Large burden of pulmonary emboli originating in the bilateral main pulmonary arteries extending into the lobar, segmental and subsegmental branches. Right heart strain with RV/LV ratio of 1.7. Critical Value/emergent results were called by telephone at the time of interpretation on 06/13/2023 at 11:23 pm to provider [facility] , who verbally acknowledged these results. Electronically Signed   By: Minerva Fester M.D.   On: 06/13/2023 23:31   DG Chest 2 View  Result Date: 06/13/2023 CLINICAL DATA:  Shortness of breath. EXAM: CHEST - 2 VIEW COMPARISON:  Radiograph 02/27/2020 FINDINGS: Borderline cardiomegaly. Unchanged mediastinal contours. No focal airspace disease. No pleural fluid or pneumothorax. No pulmonary edema. No acute osseous findings. IMPRESSION: Borderline cardiomegaly. No acute pulmonary process. Electronically Signed   By: Narda Rutherford M.D.   On: 06/13/2023 16:40        Scheduled Meds:  nicotine  21 mg Transdermal Daily   sodium chloride flush  3 mL Intravenous Q12H   Continuous Infusions:  heparin 1,900 Units/hr (06/14/23 0710)     LOS: 1 day     Silvano Bilis, MD [facility]   If 7PM-7AM, please contact night-coverage [email or web address] Password [facility] 06/14/2023, 8:31 AM

## 2023-06-14 NOTE — H&P (View-Only) (Signed)
Hospital Consult    Reason for Consult:  Bilateral Pulmonary Embolisms Requesting Physician:  Dr Lindajo Royal MD MRN #:  [id number]  History of Present Illness: This is a 47 y.o. male with medical history significant for COPD, nicotine dependence, OSA not on CPAP, HFrEF(EF 45 to 50% 06/2022) who presents today with a 2-day history of progressively worsening shortness of breath associated with lightheadedness and feeling like he is going to pass out when ambulating.  He denies cough, fever or chills.  He has no chest pain.  He has a cough that is nonproductive and has no fever or chills.  He denies lower extremity pain or swelling.   Past Medical History:  Diagnosis Date   CHF (congestive heart failure) (HCC)    Diverticulosis large intestine w/o perforation or abscess w/o bleeding 12/11/2016   Dysrhythmia    tacchycardia new..  put on metoprolol by dr. 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   Umbilical hernia     Past Surgical History:  Procedure Laterality Date   ESOPHAGOGASTRODUODENOSCOPY ENDOSCOPY  2014   INSERTION OF MESH N/A 01/04/2017   Procedure: INSERTION OF MESH;  Surgeon: Henrene Dodge, MD;  Location: [facility] ORS;  Service: General;  Laterality: N/A;   UMBILICAL HERNIA REPAIR N/A 01/04/2017   Procedure: HERNIA REPAIR UMBILICAL ADULT with mesh;  Surgeon: Henrene Dodge, MD;  Location: [facility] ORS;  Service: General;  Laterality: N/A;    No Known Allergies  Prior to Admission medications   Medication Sig Start Date End Date Taking? Authorizing Provider  allopurinol (ZYLOPRIM) 300 MG tablet Take 300 mg by mouth daily.    Yes [provider]  aspirin EC 81 MG EC tablet Take 1 tablet (81 mg total) by mouth daily. 02/29/20  Yes Arnetha Courser, MD  colchicine 0.6 MG tablet Take 0.6-1.2 mg by mouth See admin instructions.  Take 2 tablets (1.2mg ) by mouth at onset of gout flare - take 1 additional tablet (0.6mg ) by mouth after an hour if needed   Yes [provider]  dapagliflozin propanediol (FARXIGA) 10 MG TABS tablet Take 1 tablet by mouth daily. 11/04/22 11/04/23 Yes [provider]  predniSONE (DELTASONE) 10 MG tablet Take by mouth daily-4, 4, 4, 3, 3, 3, 2, 2, 2, 1, 1, 1 and stop 05/30/23  Yes Hande, Vishwanath, MD  sacubitril-valsartan (ENTRESTO) 97-103 MG Take 1 tablet by mouth 2 (two) times daily. 11/04/22  Yes [provider]  Torsemide 40 MG TABS Take 80 mg by mouth daily. 07/01/22  Yes [provider]  losartan (COZAAR) 100 MG tablet Take 1 tablet (100 mg total) by mouth daily. 02/04/18   Alford Highland, MD  metoprolol succinate (TOPROL-XL) 25 MG 24 hr tablet Take 1 tablet (25 mg total) by mouth daily. 02/29/20   Arnetha Courser, MD  omeprazole (PRILOSEC OTC) 20 MG tablet Take 20 mg by mouth daily.    [provider]  potassium chloride SA (K-DUR,KLOR-CON) 20 MEQ tablet Take 1 tablet (20 mEq total) by mouth daily. 08/14/18   Delma Freeze, FNP    Social History   Socioeconomic History   Marital status: Divorced    Spouse name: Not on file   Number of children: Not on file   Years of education: Not on file   Highest education level:  Not on file  Occupational History   Not on file  Tobacco Use   Smoking status: Every Day    Current packs/day: 1.00    Average packs/day: 1 pack/day for 20.0 years (20.0 ttl pk-yrs)    Types: Cigarettes   Smokeless tobacco: Never  Substance and Sexual Activity   Alcohol use: No    Comment: SOBER SINCE 2010   Drug use: No   Sexual activity: Yes    Birth control/protection: None  Other Topics Concern   Not on file  Social History Narrative   Not on file   Social Determinants of Health   Financial Resource Strain: Not on file  Food Insecurity: Not on file  Transportation Needs: Not on file  Physical Activity: Not on file   Stress: Not on file  Social Connections: Not on file  Intimate Partner Violence: Not on file     Family History  Problem Relation Age of Onset   Breast cancer Mother    Heart disease Mother    Prostate cancer Father    Colon cancer Father    Diabetes Sister    Heart disease Sister     ROS: Otherwise negative unless mentioned in HPI  Physical Examination  Vitals:   06/14/23 0430 06/14/23 0444  BP: (!) 85/72 (!) 111/91  Pulse: 97 92  Resp: 16 17  Temp:  97.6 F (36.4 C)  SpO2: 93% 94%   Body mass index is 44.2 kg/m.  General:  WDWN in NAD Gait: Not observed HENT: WNL, normocephalic Pulmonary: normal labored breathing, with Rales in bilateral bases,  no rhonchi,  wheezing noted. Patient requires 4 liters nasal cannula oxygen  Cardiac: regular, Tachycardic in 110-120, without  Murmurs, rubs or gallops; without carotid bruits Abdomen: Positive bowel sounds throughout,  soft, NT/ND, no masses, Large obese abdomen Skin: without rashes Vascular Exam/Pulses: Palpable pulses throughout Extremities: without ischemic changes, without Gangrene , without cellulitis; without open wounds;  Musculoskeletal: no muscle wasting or atrophy  Neurologic: A&O X 3;  No focal weakness or paresthesias are detected; speech is fluent/normal Psychiatric:  The pt has Normal affect. Lymph:  Unremarkable  CBC    Component Value Date/Time   WBC 14.6 (H) 06/13/2023 1549   RBC 5.44 06/13/2023 1549   HGB 17.2 (H) 06/13/2023 1549   HCT 51.6 06/13/2023 1549   PLT 240 06/13/2023 1549   MCV 94.9 06/13/2023 1549   MCH 31.6 06/13/2023 1549   MCHC 33.3 06/13/2023 1549   RDW 12.3 06/13/2023 1549    BMET    Component Value Date/Time   NA 138 06/13/2023 1549   K 3.4 (L) 06/13/2023 1549   CL 97 (L) 06/13/2023 1549   CO2 27 06/13/2023 1549   GLUCOSE 111 (H) 06/13/2023 1549   BUN 24 (H) 06/13/2023 1549   CREATININE 1.65 (H) 06/13/2023 1549   CALCIUM 9.0 06/13/2023 1549   GFRNONAA 52 (L)  06/13/2023 1549   GFRAA >60 02/28/2020 0514    COAGS: Lab Results  Component Value Date   INR 1.1 06/13/2023     Non-Invasive Vascular Imaging:   EXAM:06/13/23 CT ANGIOGRAPHY CHEST WITH CONTRAST   TECHNIQUE: Multidetector CT imaging of the chest was performed using the standard protocol during bolus administration of intravenous contrast. Multiplanar CT image reconstructions and MIPs were obtained to evaluate the vascular anatomy.   RADIATION DOSE REDUCTION: This exam was performed according to the departmental dose-optimization program which includes automated exposure control, adjustment of the mA and/or kV according to  patient size and/or use of iterative reconstruction technique.   CONTRAST:  OMNIPAQUE IOHEXOL 350 MG/ML SOLN   COMPARISON:  Chest radiograph 06/13/2023 and CTA chest 02/03/2018   FINDINGS: Cardiovascular: Large bilateral pulmonary emboli originating within the right and left main pulmonary artery extending into multiple lobar, segmental, and subsegmental branches. There is evidence of right heart strain with RV/LV ratio of 1.7. No pericardial effusion.   Mediastinum/Nodes: Trachea and esophagus are unremarkable. No thoracic adenopathy   Lungs/Pleura: No focal consolidation, pleural effusion, or pneumothorax.   Upper Abdomen: No acute abnormality.   Musculoskeletal: No acute fracture.   Review of the MIP images confirms the above findings.   IMPRESSION: Large burden of pulmonary emboli originating in the bilateral main pulmonary arteries extending into the lobar, segmental and subsegmental branches. Right heart strain with RV/LV ratio of 1.7.  Statin:  No. Beta Blocker:  Yes.   Aspirin:  Yes.   ACEI:  No. ARB:  Yes.   CCB use:  No Other antiplatelets/anticoagulants:  No.    ASSESSMENT/PLAN: This is a 47 y.o. male with a significant medical history for COPD, OSA not on CPAP with heart failure who presents to [facility] urgency department  with a 2-day history of progressively worsening shortness of breath.  He associates this with lightheadedness and feeling like he is going to pass out when ambulating.  Upon workup patient had a CT scan of his chest which revealed a large pulmonary embolism with RV to LV ratio of 1.7.  Vascular surgery plans on taking the patient to the vascular lab later today for a pulmonary thrombectomy.  I discussed in detail today with the patient and family members the procedure, benefits, risk, and complications.  The patient and his family verbalized her understanding.  They would like to proceed as soon as possible.  I answered all her questions this morning.  Patient has been n.p.o. since midnight last night.  Patient is currently on a heparin drip and denies taking any other blood thinning medication.  Patient's current BUN and creatinine are 24/1.65.  Patient's estimated GFR is 52.  Also significant on his labs or B natruretic peptide at 801.7   -I discussed the plan in detail with Dr. Festus Barren MD and he agrees with the plan.   Marcie Bal [facility] 06/14/2023 7:20 AM

## 2023-06-14 NOTE — Progress Notes (Signed)
ANTICOAGULATION CONSULT NOTE  Pharmacy Consult for Heparin Infusion Indication: pulmonary embolus  No Known Allergies  Patient Measurements: Height: 6\' 1"  (185.4 cm) Weight: (!) 152 kg (335 lb) IBW/kg (Calculated) : 79.9 Heparin Dosing Weight: 115.5 kg  Vital Signs: Temp: 97.6 F (36.4 C) (08/20 0444) Temp Source: Oral (08/20 0120) BP: 111/91 (08/20 0444) Pulse Rate: 92 (08/20 0444)  Labs: Recent Labs    06/13/23 1549 06/13/23 1757 06/13/23 2328 06/14/23 0649  HGB 17.2*  --   --   --   HCT 51.6  --   --   --   PLT 240  --   --   --   APTT  --   --  28  --   LABPROT  --   --  14.6  --   INR  --   --  1.1  --   HEPARINUNFRC  --   --   --  0.54  CREATININE 1.65*  --   --   --   TROPONINIHS 27* 27*  --   --     Estimated Creatinine Clearance: 86 mL/min (A) (by C-G formula based on SCr of 1.65 mg/dL (H)).   Medical History: Past Medical History:  Diagnosis Date   CHF (congestive heart failure) (HCC)    Diverticulosis large intestine w/o perforation or abscess w/o bleeding 12/11/2016   Dysrhythmia    tacchycardia new..  put on metoprolol by dr. 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   Umbilical hernia     Assessment: Ronald Pratt is a 47 y.o. male presenting with SOB. PMH significant for HTN, TUD, sCHF, COPD, diverticulosis. Patient was not on [facility] PTA per chart review. CTA with large burden of PE extending from bilateral middle pulmonary arteries all the way to the subsegmental branches with RV LV ratio of 1.7. Vascular evaluating for intervention. Pharmacy has been consulted to initiate and manage heparin infusion.   Baseline Labs: Hgb 17.2, Hct 51.6, Plt 240   Goal of Therapy:  Heparin level 0.3-0.7 units/ml Monitor platelets by anticoagulation protocol: Yes   Date Time HL Rate/Comment   8/20 0649 0.54 1900/therapeutic x1  Plan:  Continue heparin infusion at 1900 units/hr Check HL in 6 hours  Continue to monitor H&H and platelets daily while on heparin infusion   Celene Squibb, PharmD Clinical Pharmacist 06/14/2023 7:30 AM

## 2023-06-14 NOTE — Progress Notes (Signed)
ANTICOAGULATION CONSULT NOTE  Pharmacy Consult for Heparin Infusion Indication: pulmonary embolus  No Known Allergies  Patient Measurements: Height: 6\' 1"  (185.4 cm) Weight: (!) 146 kg (321 lb 14 oz) IBW/kg (Calculated) : 79.9 Heparin Dosing Weight: 115.5 kg  Vital Signs: Temp: 97.7 F (36.5 C) (08/20 2305) Temp Source: Oral (08/20 1658) BP: 115/70 (08/20 2305) Pulse Rate: 100 (08/20 2305)  Labs: Recent Labs    06/13/23 1549 06/13/23 1757 06/13/23 2328 06/14/23 0649 06/14/23 1413 06/14/23 2149  HGB 17.2*  --   --   --   --   --   HCT 51.6  --   --   --   --   --   PLT 240  --   --   --   --   --   APTT  --   --  28  --   --   --   LABPROT  --   --  14.6  --   --   --   INR  --   --  1.1  --   --   --   HEPARINUNFRC  --   --   --  0.54 0.27* 0.67  CREATININE 1.65*  --   --   --   --   --   TROPONINIHS 27* 27*  --   --   --   --     Estimated Creatinine Clearance: 84.1 mL/min (A) (by C-G formula based on SCr of 1.65 mg/dL (H)).   Medical History: Past Medical History:  Diagnosis Date   CHF (congestive heart failure) (HCC)    Diverticulosis large intestine w/o perforation or abscess w/o bleeding 12/11/2016   Dysrhythmia    tacchycardia new..  put on metoprolol by dr. 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   Umbilical hernia     Assessment: Ronald Pratt is a 47 y.o. male presenting with SOB. PMH significant for HTN, TUD, sCHF, COPD, diverticulosis. Patient was not on [facility] PTA per chart review. CTA with large burden of PE extending from bilateral middle pulmonary arteries all the way to the subsegmental branches with RV LV ratio of 1.7. Vascular evaluating for intervention. Pharmacy has been consulted to initiate and manage heparin infusion.   Baseline Labs: Hgb 17.2, Hct 51.6, Plt 240   Goal of Therapy:   Heparin level 0.3-0.7 units/ml Monitor platelets by anticoagulation protocol: Yes   Date Time HL Rate/Comment  8/20 0649 0.54 1900/therapeutic x1 8/20 2149 0.67 Therapeutic x 1  Plan:  Continue heparin infusion rate to 2150 units/hr Recheck heparin level w/ AM labs to confirm Continue to monitor H&H and platelets daily while on heparin infusion   Otelia Sergeant, PharmD, [facility] 06/14/2023 11:26 PM

## 2023-06-14 NOTE — Op Note (Signed)
[facility]  Percutaneous Study/Intervention Procedural Note   Date of Surgery: 06/14/2023,6:36 PM  Surgeon: Festus Barren  Pre-operative Diagnosis: Symptomatic bilateral pulmonary emboli  Post-operative diagnosis:  Same  Procedure(s) Performed:  1.  Contrast injection right heart  2.  Thrombolysis with 8 mg of tPA, 4 mg in each of the main pulmonary arteries  3.  Mechanical thrombectomy to right lower lobe pulmonary artery and to left lower and upper lobe pulmonary arteries using the penumbra 16 flash catheter  4.  Selective catheter placement right lower lobe, middle lobe, and upper lobe pulmonary arteries  5.  Selective catheter placement left lower lobe and upper lobe pulmonary arteries    Anesthesia: Conscious sedation was administered under my direct supervision by the interventional radiology RN. IV Versed plus fentanyl were utilized. Continuous ECG, pulse oximetry and blood pressure was monitored throughout the entire procedure.  Versed and fentanyl were administered intravenously.  Conscious sedation was administered for a total of 46 minutes using 2.5 mg of Versed and 75 mcg of Fentanyl.  EBL: 450 cc  Sheath: 16 French right femoral vein  Contrast: 75 cc   Fluoroscopy Time: 15.5 minutes  Indications:  Patient presents with pulmonary emboli. The patient is symptomatic with hypoxemia and dyspnea even at rest.  He does have underlying baseline lung disease as well.  There is evidence of right heart strain on the CT angiogram. The patient is otherwise a good candidate for intervention and even the long-term benefits pulmonary angiography with thrombolysis is offered. The risks and benefits are reviewed long-term benefits are discussed. All questions are answered patient agrees to proceed.  Procedure:  Ronald Pratt a 47 y.o. male who was identified and appropriate procedural time out was performed.  The patient was then placed supine on the table and prepped  and draped in the usual sterile fashion.  Ultrasound was used to evaluate the right common femoral vein.  It was patent, as it was echolucent and compressible.  A digital ultrasound image was acquired for the permanent record.  A Seldinger needle was used to access the right common femoral vein under direct ultrasound guidance.  A 0.035 J wire was advanced without resistance and a 5Fr sheath was placed.  I then placed a ProGlide device in a preclose fashion and then upsized to an 16 [place] sheath.    The wire and pigtail catheter were then negotiated into the right atrium and bolus injection of contrast was utilized to demonstrate the right ventricle and the pulmonary artery outflow.  His heart and pulmonary outflow tract were markedly dilated indicative both of large volume pulmonary embolus and likely chronic pulmonary hypertension and pulmonary disease.  The advantage wire and JR4 catheter were then negotiated into the main pulmonary artery and then the left lower and upper lobe pulmonary arteries where hand injection of contrast was utilized to demonstrate the pulmonary arteries and confirm the locations of the pulmonary emboli.  Extensive thrombosis was seen in the left main, upper, and lower lobe pulmonary arteries.  I then transition to the right side with a JR4 catheter and the advantage wire.  Catheter was advanced first into the right lower lobe pulmonary artery where extensive thrombosis was seen on selective imaging going back right main pulmonary artery.  Tediously, was able to get the JR4 catheter advantage wire into the middle lobe and upper lobe pulmonary arteries which all show extensive thrombus.  3000 units of heparin was then given and allowed to circulate.  TPA was reconstituted and delivered onto the table. A total of 8 milligrams of TPA was utilized.  4 mg was administered on the left side and 4 mg was administered on the right side. This was then allowed to dwell.  The Penumbra Cat 16  Flash catheter was then advanced up into the pulmonary vasculature. The right lung was addressed first. Catheter was negotiated into the right lower lobe and mechanical thrombectomy was performed. Follow-up imaging demonstrated a good result.  Getting the large 16 French catheter up into the upper lobe and even the middle lobe pulmonary artery was found to be difficult.  The right lower lobe had extensive thrombectomy and the left lung had even more extensive thrombus in the right so I elected to turn my attention to the left.  The Penumbra Cat 16 Flash catheter was then negotiated to the opposite side. The left lung was then addressed. Catheter was negotiated into the left upper lobe pulmonary artery and mechanical thrombectomy was performed. Follow-up imaging demonstrated a good result and therefore the catheter was renegotiated into the left lower lobe pulmonary artery and again mechanical thrombectomy was performed. Passes were made with both the Penumbra catheter and extensive thrombus was removed.  Follow-up imaging was then performed.  There was marked improvement in the left upper lobe and left lower lobe pulmonary arteries as well as the left main pulmonary artery after thrombectomy. Attempts were again made to get to the right middle and upper lobe pulmonary arteries for thrombectomy, but these were not successful with the large 16 French catheter and at this point we were well over 400 cc of blood loss and I think any further efforts would be more risky than beneficial.  After review these images wires were reintroduced and the catheter is removed. Then, the sheath is removed, the Pro-glide device is secured and a 4-0 Monocryl was placed in the skin and pressures held. A pressure dressing is placed.    Findings:   Right heart imaging:  Right atrium and right ventricle and the pulmonary outflow tract appears markedly dilated.  Right lung: Extensive thrombus burden in the right main pulmonary  artery extending into all 3 lobar branches  Left lung:  Extensive thrombosis was seen in the left main, upper, and lower lobe pulmonary arteries.    Disposition: Patient was taken to the recovery room in stable condition having tolerated the procedure well.  Festus Barren 06/14/2023,6:36 PM

## 2023-06-15 ENCOUNTER — Other Ambulatory Visit (HOSPITAL_COMMUNITY): Payer: Self-pay

## 2023-06-15 ENCOUNTER — Encounter: Payer: Self-pay | Admitting: Vascular Surgery

## 2023-06-15 DIAGNOSIS — I2602 Saddle embolus of pulmonary artery with acute cor pulmonale: Secondary | ICD-10-CM | POA: Diagnosis not present

## 2023-06-15 LAB — ECHOCARDIOGRAM COMPLETE
Area-P 1/2: 4.06 cm2
Height: 73 in
S' Lateral: 2.8 cm
Weight: 5149.95 [oz_av]

## 2023-06-15 LAB — HEPARIN LEVEL (UNFRACTIONATED)
Heparin Unfractionated: 0.72 [IU]/mL — ABNORMAL HIGH (ref 0.30–0.70)
Heparin Unfractionated: 0.43 [IU]/mL (ref 0.30–0.70)

## 2023-06-15 LAB — CBC
HCT: 45.6 % (ref 39.0–52.0)
Hemoglobin: 15.1 g/dL (ref 13.0–17.0)
MCH: 31.5 pg (ref 26.0–34.0)
MCHC: 33.1 g/dL (ref 30.0–36.0)
MCV: 95.2 fL (ref 80.0–100.0)
Platelets: 203 10*3/uL (ref 150–400)
RBC: 4.79 MIL/uL (ref 4.22–5.81)
RDW: 12.4 % (ref 11.5–15.5)
WBC: 9.9 10*3/uL (ref 4.0–10.5)
nRBC: 0 % (ref 0.0–0.2)

## 2023-06-15 LAB — HOMOCYSTEINE: Homocysteine: 30.3 umol/L — ABNORMAL HIGH (ref 0.0–14.5)

## 2023-06-15 LAB — GLUCOSE, CAPILLARY
Glucose-Capillary: 122 mg/dL — ABNORMAL HIGH (ref 70–99)
Glucose-Capillary: 154 mg/dL — ABNORMAL HIGH (ref 70–99)
Glucose-Capillary: 117 mg/dL — ABNORMAL HIGH (ref 70–99)
Glucose-Capillary: 111 mg/dL — ABNORMAL HIGH (ref 70–99)

## 2023-06-15 LAB — BASIC METABOLIC PANEL
Anion gap: 5 (ref 5–15)
BUN: 29 mg/dL — ABNORMAL HIGH (ref 6–20)
CO2: 27 mmol/L (ref 22–32)
Calcium: 8.4 mg/dL — ABNORMAL LOW (ref 8.9–10.3)
Chloride: 104 mmol/L (ref 98–111)
Creatinine, Ser: 1.47 mg/dL — ABNORMAL HIGH (ref 0.61–1.24)
GFR, Estimated: 59 mL/min — ABNORMAL LOW (ref >60)
Glucose, Bld: 129 mg/dL — ABNORMAL HIGH (ref 70–99)
Potassium: 3.4 mmol/L — ABNORMAL LOW (ref 3.5–5.1)
Sodium: 136 mmol/L (ref 135–145)

## 2023-06-15 LAB — LIPOPROTEIN A (LPA): Lipoprotein (a): 59.7 nmol/L — ABNORMAL HIGH (ref <75.0)

## 2023-06-15 MED ORDER — PANTOPRAZOLE SODIUM 40 MG PO TBEC
40.0000 mg | DELAYED_RELEASE_TABLET | Freq: Every day | ORAL | Status: DC
  Administered 2023-06-15 – 2023-06-16 (×2): 40 mg via ORAL
  Filled 2023-06-15 (×3): qty 1

## 2023-06-15 MED ORDER — OMEPRAZOLE MAGNESIUM 20 MG PO TBEC: 20.0000 mg | DELAYED_RELEASE_TABLET | Freq: Every day | ORAL | Status: DC

## 2023-06-15 MED ORDER — APIXABAN 5 MG PO TABS
10.0000 mg | ORAL_TABLET | Freq: Two times a day (BID) | ORAL | Status: DC
  Administered 2023-06-15 – 2023-06-16 (×2): 10 mg via ORAL
  Filled 2023-06-15 (×2): qty 2

## 2023-06-15 MED ORDER — APIXABAN 5 MG PO TABS: 5.0000 mg | ORAL_TABLET | Freq: Two times a day (BID) | ORAL | Status: DC

## 2023-06-15 MED ORDER — DAPAGLIFLOZIN PROPANEDIOL 10 MG PO TABS
10.0000 mg | ORAL_TABLET | Freq: Every day | ORAL | Status: DC
  Administered 2023-06-15 – 2023-06-16 (×2): 10 mg via ORAL
  Filled 2023-06-15 (×2): qty 1

## 2023-06-15 NOTE — Plan of Care (Signed)
  Problem: Education: Goal: Knowledge of condition and prescribed therapy will improve Outcome: Progressing   Problem: Cardiac: Goal: Will achieve and/or maintain adequate cardiac output Outcome: Progressing   Problem: Clinical Measurements: Goal: Respiratory complications will improve Outcome: Progressing   Problem: Pain Managment: Goal: General experience of comfort will improve Outcome: Progressing   Problem: Safety: Goal: Ability to remain free from injury will improve Outcome: Progressing

## 2023-06-15 NOTE — Progress Notes (Signed)
ANTICOAGULATION CONSULT NOTE  Pharmacy Consult for Heparin Infusion Indication: pulmonary embolus  No Known Allergies  Patient Measurements: Height: 6\' 1"  (185.4 cm) Weight: (!) 143.5 kg (316 lb 5.8 oz) IBW/kg (Calculated) : 79.9 Heparin Dosing Weight: 115.5 kg  Vital Signs: Temp: 98 F (36.7 C) (08/21 1152) Temp Source: Oral (08/21 1152) BP: 96/80 (08/21 1152) Pulse Rate: 91 (08/21 1152)  Labs: Recent Labs    06/13/23 1549 06/13/23 1757 06/13/23 2328 06/14/23 0649 06/14/23 2149 06/15/23 0634 06/15/23 1434  HGB 17.2*  --   --   --   --  15.1  --   HCT 51.6  --   --   --   --  45.6  --   PLT 240  --   --   --   --  203  --   APTT  --   --  28  --   --   --   --   LABPROT  --   --  14.6  --   --   --   --   INR  --   --  1.1  --   --   --   --   HEPARINUNFRC  --   --   --    < > 0.67 0.72* 0.43  CREATININE 1.65*  --   --   --   --  1.47*  --   TROPONINIHS 27* 27*  --   --   --   --   --    < > = values in this interval not displayed.    Estimated Creatinine Clearance: 93.5 mL/min (A) (by C-G formula based on SCr of 1.47 mg/dL (H)).   Medical History: Past Medical History:  Diagnosis Date   CHF (congestive heart failure) (HCC)    Diverticulosis large intestine w/o perforation or abscess w/o bleeding 12/11/2016   Dysrhythmia    tacchycardia new..  put on metoprolol by dr. 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   Umbilical hernia     Assessment: Ronald Pratt is a 47 y.o. male presenting with SOB. PMH significant for HTN, TUD, sCHF, COPD, diverticulosis. Patient was not on [facility] PTA per chart review. CTA with large burden of PE extending from bilateral middle pulmonary arteries all the way to the subsegmental branches with RV LV ratio of 1.7. Vascular evaluating for intervention. Pharmacy has been consulted  to initiate and manage heparin infusion.    Goal of Therapy:  Heparin level 0.3-0.7 units/ml Monitor platelets by anticoagulation protocol: Yes   Date Time HL Rate/Comment  8/20 0649 0.54 1900/therapeutic x1 8/20 2149 0.67 Therapeutic x 1 8/21 0634 0.72 Supratherapeutic 8/21 1434 0.43 Therapeutic.   Plan:  Heparin level is therapeutic. Will continue heparin infusion at 2050 units/hr. Recheck heparin level and CBC with AM labs.   Paschal Dopp, PharmD, 06/15/2023 3:41 PM

## 2023-06-15 NOTE — Progress Notes (Addendum)
[facility] HF Stewardship  PCP: Barbette Reichmann, MD  PCP-Cardiologist: None  HPI: Ronald Pratt is a 47 y.o. male with COPD, nicotine dependence, OSA not on CPAP, HFrEF(EF 45 to 50% 06/2022  who presented with a 2 day history of progressive shortness of breath and lightheadedness. CT scan on 8/20 CT revealed a large saddle pulmonary embolism with right heart strain (RV to LV ratio of 1.7). Received catheter directed tPA on 8/20 and is now on IV heparin. Doppler on 8/20 ositive for nonocclusive thrombus within the left popliteal vein. Hypercoaguable work-up in progress. BNP 801.7 on admission. Echocardiogram performed on 06/14/23 not yet read. Creatinine elevated on admission to 1.65, now trending down to 1.47.   Pertinent Lab Values: Creatinine, Ser  Date Value Ref Range Status  06/15/2023 1.47 (H) 0.61 - 1.24 mg/dL Final   BUN  Date Value Ref Range Status  06/15/2023 29 (H) 6 - 20 mg/dL Final   Potassium  Date Value Ref Range Status  06/15/2023 3.4 (L) 3.5 - 5.1 mmol/L Final   Sodium  Date Value Ref Range Status  06/15/2023 136 135 - 145 mmol/L Final   B Natriuretic Peptide  Date Value Ref Range Status  06/13/2023 801.7 (H) 0.0 - 100.0 pg/mL Final    Comment:    Performed at [facility], [address] [place], [place] [address]   Hgb A1c MFr Bld  Date Value Ref Range Status  06/14/2023 7.4 (H) 4.8 - 5.6 % Final    Comment:    (NOTE) Pre diabetes:          5.7%-6.4%  Diabetes:              >6.4%  Glycemic control for   <7.0% adults with diabetes     Vital Signs: Temp:  [97.7 F (36.5 C)-98.5 F (36.9 C)] 98.5 F (36.9 C) (08/21 0713) Pulse Rate:  [60-114] 84 (08/21 0713) Cardiac Rhythm: Normal sinus rhythm (08/21 0729) Resp:  [15-25] 16 (08/21 0713) BP: (94-132)/(66-117) 103/80 (08/21 0713) SpO2:  [88 %-100 %] 96 % (08/21 0713) Weight:  [143.5 kg (316 lb 5.8 oz)] 143.5 kg (316 lb 5.8 oz) (08/21 0447)   Intake/Output Summary (Last 24 hours) at 06/15/2023  1028 Last data filed at 06/15/2023 0710 Gross per 24 hour  Intake 423 ml  Output 1500 ml  Net -1077 ml    Current Inpatient Medications:  -None  Prior to admission HF Medications:  Metoprolol succiante 25 mg daily Entresto 97-103 mg BID Farxiga 10 mg daily Torsemide 40 mg daily  No fill history for medications other than torsemide, however patient reports medication adherence. Suspect nonadherence  Assessment: 1. Systolic heart failure (LVEF 45-50% in 2021 and 2023), due to presumed NICM. NYHA class II-III symptoms.  -Patient is feeling great today after lytic administration. He does not have any LEE or respiratory symptoms at rest. -BP is low, limiting restarting home medications at this time. Can consider restarting home Farxiga given minimal impact on BP.  -Creatinine improved from 1.65 to 1.47 today.  Plan: 1) Medication changes recommended at this time: -Consider restarting home Farxiga 10 mg daily for HF and T2DM  2) Patient assistance: -Copay for Eliquis, Sherryll Burger, and Farxiga are all $50. Qualifies for copay cards to reduce copays.  3) Education: -- Patient has been educated on current HF medications and potential additions to HF medication regimen - Patient verbalizes understanding that over the next few months, these medication doses may change and more medications may be  added to optimize HF regimen - Patient has been educated on basic disease state pathophysiology and goals of therapy  Medication Assistance / Insurance Benefits Check:  Does the patient have prescription insurance?    Type of insurance plan:   Does the patient qualify for medication assistance through manufacturers or grants? No  Outpatient Pharmacy:  Prior to admission outpatient pharmacy: [facility]   Is the patient willing to utilize a [facility] pharmacy at discharge?: Yes  Thank you for involving pharmacy in this patient's care.  Enos Fling, PharmD, BCPS Phone -  [phone] Clinical Pharmacist 06/15/2023 10:28 AM

## 2023-06-15 NOTE — TOC Benefit Eligibility Note (Signed)
Patient Product/process development scientist completed.    The patient is insured through  [employer] . Patient has [employer], may use a copay card, and/or apply for patient assistance if available.    Ran test claim for Entresto 24-26 mg and the current 30 day co-pay is $50.00.  Ran test claim for Eliquis 5 mg and the current 30 day co-pay is $50.00.  Ran test claim for Farxiga 10 mg and the current 30 day co-pay is $50.00.  Ran test claim for Jardiance 10 mg and the current 30 day co-pay is $50.00.  This test claim was processed through [facility]- copay amounts may vary at other pharmacies due to pharmacy/plan contracts, or as the patient moves through the different stages of their insurance plan.     Roland Earl, CPHT Pharmacy Technician III Certified Patient Advocate [facility] Patient Advocate Team Direct Number: [phone]  Fax: [phone]

## 2023-06-15 NOTE — Progress Notes (Signed)
PROGRESS NOTE    Ronald Pratt  XBJ:[id number] DOB: 19-Mar-1976 DOA: 06/13/2023 PCP: Barbette Reichmann, MD  Outpatient Specialists: cardiology    Brief Narrative:   From admission h and p  Ronald Pratt is a 47 y.o. male with medical history significant for COPD, nicotine dependence, OSA not on CPAP, HFrEF(EF 45 to 50% 06/2022) who presents today with a 2-day history of progressively worsening shortness of breath associated with lightheadedness and feeling like he is going to pass out when ambulating.  He denies cough, fever or chills.  He has no chest pain.  He has a cough that is nonproductive and has no fever or chills.  He denies lower extremity pain or swelling.   Assessment & Plan:   Principal Problem:   Acute saddle pulmonary embolism with acute cor pulmonale (HCC) Active Problems:   Saddle embolus of pulmonary artery with acute cor pulmonale (HCC)   Chronic systolic CHF (congestive heart failure) (HCC)   COPD (chronic obstructive pulmonary disease) (HCC)   Obstructive sleep apnea   Obesity, Class III, BMI 40-49.9 (morbid obesity) (HCC)   Benign essential hypertension   Tobacco use disorder   Seizures (HCC)   T2DM (type 2 diabetes mellitus) (HCC)   Acute pulmonary embolism with acute cor pulmonale (HCC)   SOB (shortness of breath)  # Acute pulmonary embolus # Acute DVT Unprovoked. Large clot burden in bilateral main pulmonary arteries with evidence right heart strain. PVL with thrombus left popliteal vein. No family history but is in poor health.  Smoking is risk factor. - s/p thrombectomy/thrombolysis later today - continue IV heparin - follow pending Echo - follow pending thrombophilia labs - transition to DOAC when okay from vascular standpoint  #Hypokalemia - K 3.4 - PO K-Cl ordered - Monitor BMP  # HFmrEF with acute decompensation EF 45-50% most recent TTE, here with evidence right heart strain on CT, dyspnea - on torsemide 80 at home, will hold - received lasix  40 in the ER, 60 IV again on 8/20 - monitor volume status - daily weights - holding home entresto, metop, losartan due to soft BP's - farxiga resumed  # AKI Cr 1.65 from baseline of ~1.2 suspect 2/2 chf - closely monitor while diuresing  # Acute hypoxic respiratory failure - O2 sat 88% at rest on room air today 8/21 - supplement O2 if sats < 88% on room air - check ambulatory O2 sats - likely desats when sleeping given untreated OSA - will do nocturnal pulse oximetry to see if he qualifies for at least home nocturnal O2  # OSA Reports not being on cpap due to insurance not covering it - cpap while inpatient - outpatient follow up for sleep study & set up for home CPAP - nocturnal pulse ox study as above  # COPD - stable, no exacerbated Appear not to be on maintenance meds - monitor  # T2DM Most recent A1c 8.5% last month - Resume farxiga - SSI  # HTN Here bp wnl - holding home losart, metop for now  # Tobacco abuse - smoking is VTE risk factor - nicotine patch - pt expresses commitment to cessation  # Obesity - Body mass index is 41.74 kg/m. Complicates overall care and prognosis.  Recommend lifestyle modifications including physical activity and diet for weight loss and overall long-term health.    DVT prophylaxis: IV heparin Code Status: full Family Communication: sister and mother updated @ bedside 8/20  Level of care: Progressive Status is: Inpatient Remains  inpatient appropriate because: closely monitoring borderline O2 sats with acute PE s/p thrombectomy, remains on IV heparin    Consultants:  Vascular surgery  Procedures: Mechanical thrombectomy  Antimicrobials:  none    Subjective: Pt awake sitting up in bed this AM.  He reports improved dyspnea.  States O2 levels were in mid 90's prior to procedure yesterday, borderline since then.  Denies CP, dizziness, lightheadness.  Would like to get up to ambulate and get dressed.  No other complaints.    Objective: Vitals:   06/15/23 0500 06/15/23 0713 06/15/23 1000 06/15/23 1152  BP: 108/68 103/80  96/80  Pulse:  84  91  Resp:  16 20   Temp:  98.5 F (36.9 C)  98 F (36.7 C)  TempSrc:  Oral  Oral  SpO2:  96% 90% 94%  Weight:      Height:        Intake/Output Summary (Last 24 hours) at 06/15/2023 1604 Last data filed at 06/15/2023 1101 Gross per 24 hour  Intake 543 ml  Output 900 ml  Net -357 ml   Filed Weights   06/13/23 2321 06/14/23 0915 06/15/23 0447  Weight: (!) 152 kg (!) 146 kg (!) 143.5 kg    Examination:  General exam: awake, alert, no acute distress HEENT: moist mucus membranes, hearing grossly normal  Respiratory system: CTAB, no wheezes, rales or rhonchi, normal respiratory effort. Cardiovascular system: normal S1/S2, RRR, no pedal edema.   Gastrointestinal system: soft, NT, ND, no HSM felt, +bowel sounds. Central nervous system: A&O x 3. no gross focal neurologic deficits, normal speech Extremities: moves all, no edema, normal tone Skin: dry, intact, normal temperature Psychiatry: normal mood, congruent affect, judgement and insight appear normal     Data Reviewed: I have personally reviewed following labs and imaging studies  CBC: Recent Labs  Lab 06/13/23 1549 06/15/23 0634  WBC 14.6* 9.9  HGB 17.2* 15.1  HCT 51.6 45.6  MCV 94.9 95.2  PLT 240 203   Basic Metabolic Panel: Recent Labs  Lab 06/13/23 1549 06/15/23 0634  NA 138 136  K 3.4* 3.4*  CL 97* 104  CO2 27 27  GLUCOSE 111* 129*  BUN 24* 29*  CREATININE 1.65* 1.47*  CALCIUM 9.0 8.4*   GFR: Estimated Creatinine Clearance: 93.5 mL/min (A) (by C-G formula based on SCr of 1.47 mg/dL (H)). Liver Function Tests: No results for input(s): "AST", "ALT", "ALKPHOS", "BILITOT", "PROT", "ALBUMIN" in the last 168 hours. No results for input(s): "LIPASE", "AMYLASE" in the last 168 hours. No results for input(s): "AMMONIA" in the last 168 hours. Coagulation Profile: Recent Labs  Lab  06/13/23 2328  INR 1.1   Cardiac Enzymes: No results for input(s): "CKTOTAL", "CKMB", "CKMBINDEX", "TROPONINI" in the last 168 hours. BNP (last 3 results) No results for input(s): "PROBNP" in the last 8760 hours. HbA1C: Recent Labs    06/14/23 0926  HGBA1C 7.4*   CBG: Recent Labs  Lab 06/14/23 1227 06/14/23 1621 06/14/23 2015 06/15/23 0851 06/15/23 1208  GLUCAP 93 100* 96 122* 154*   Lipid Profile: No results for input(s): "CHOL", "HDL", "LDLCALC", "TRIG", "CHOLHDL", "LDLDIRECT" in the last 72 hours. Thyroid Function Tests: No results for input(s): "TSH", "T4TOTAL", "FREET4", "T3FREE", "THYROIDAB" in the last 72 hours. Anemia Panel: No results for input(s): "VITAMINB12", "FOLATE", "FERRITIN", "TIBC", "IRON", "RETICCTPCT" in the last 72 hours. Urine analysis:    Component Value Date/Time   COLORURINE YELLOW (A) 12/11/2016 0633   APPEARANCEUR CLEAR (A) 12/11/2016 0633   LABSPEC 1.015  12/11/2016 0633   PHURINE 6.0 12/11/2016 0633   GLUCOSEU NEGATIVE 12/11/2016 0633   HGBUR NEGATIVE 12/11/2016 0633   BILIRUBINUR NEGATIVE 12/11/2016 0633   KETONESUR NEGATIVE 12/11/2016 0633   PROTEINUR NEGATIVE 12/11/2016 0633   NITRITE NEGATIVE 12/11/2016 0633   LEUKOCYTESUR NEGATIVE 12/11/2016 1478   Sepsis Labs: @LABRCNTIP (procalcitonin:4,lacticidven:4)  ) Recent Results (from the past 240 hour(s))  SARS Coronavirus 2 by RT PCR (hospital order, performed in [facility] hospital lab) *cepheid single result test* Anterior Nasal Swab     Status: None   Collection Time: 06/13/23  5:57 PM   Specimen: Anterior Nasal Swab  Result Value Ref Range Status   SARS Coronavirus 2 by RT PCR NEGATIVE NEGATIVE Final    Comment: (NOTE) SARS-CoV-2 target nucleic acids are NOT DETECTED.  The SARS-CoV-2 RNA is generally detectable in upper and lower respiratory specimens during the acute phase of infection. The lowest concentration of SARS-CoV-2 viral copies this assay can detect is 250 copies /  mL. A negative result does not preclude SARS-CoV-2 infection and should not be used as the sole basis for treatment or other patient management decisions.  A negative result may occur with improper specimen collection / handling, submission of specimen other than nasopharyngeal swab, presence of viral mutation(s) within the areas targeted by this assay, and inadequate number of viral copies (<250 copies / mL). A negative result must be combined with clinical observations, patient history, and epidemiological information.  Fact Sheet for Patients:   RoadLapTop.co.za  Fact Sheet for Healthcare Providers: http://kim-miller.com/  This test is not yet approved or  cleared by the [place] FDA and has been authorized for detection and/or diagnosis of SARS-CoV-2 by FDA under an Emergency Use Authorization (EUA).  This EUA will remain in effect (meaning this test can be used) for the duration of the COVID-19 declaration under Section 564(b)(1) of the Act, 21 U.S.C. section 360bbb-3(b)(1), unless the authorization is terminated or revoked sooner.  Performed at [facility], [address] [place], [place] [address]   MRSA Next Gen by PCR, Nasal     Status: None   Collection Time: 06/14/23 10:07 AM   Specimen: Nasal Mucosa; Nasal Swab  Result Value Ref Range Status   MRSA by PCR Next Gen NOT DETECTED NOT DETECTED Final    Comment: (NOTE) The GeneXpert MRSA Assay (FDA approved for NASAL specimens only), is one component of a comprehensive MRSA colonization surveillance program. It is not intended to diagnose MRSA infection nor to guide or monitor treatment for MRSA infections. Test performance is not FDA approved in patients less than 59 years old. Performed at [facility], [address] [place], [place] [address]          Radiology Studies: ECHOCARDIOGRAM COMPLETE  Result Date: 06/15/2023    ECHOCARDIOGRAM  REPORT   Patient Name:   Ronald Pratt Date of Exam: 06/14/2023 Medical Rec #:  [id number]    Height:       73.0 in Accession #:    [id number]   Weight:       321.9 lb Date of Birth:  05/10/1976     BSA:          2.634 m Patient Age:    46 years     BP:           104/77 mmHg Patient Gender: M            HR:  101 bpm. Exam Location:  [facility] Procedure: 2D Echo, Cardiac Doppler and Color Doppler Indications:     I26.09 Pulmonary Embolus  History:         Patient has prior history of Echocardiogram examinations, most                  recent 02/27/2020. CHF; Risk Factors:Hypertension. Obstructive                  sleep apnea.  Sonographer:     Daphine Deutscher RDCS Referring Phys:  [id number] Marlow Baars DEW Diagnosing Phys: Rozell Searing Custovic IMPRESSIONS  1. Left ventricular ejection fraction, by estimation, is 65 to 70%. The left ventricle has normal function. The left ventricle has no regional wall motion abnormalities. Left ventricular diastolic parameters were normal.  2. McConnell sign present suggestive of acute PE. Cannot definitively rule out thrombus in transit versus artifact in RV apex.. Right ventricular systolic function is severely reduced. The right ventricular size is severely enlarged.  3. Right atrial size was mildly dilated.  4. The mitral valve is grossly normal. No evidence of mitral valve regurgitation. No evidence of mitral stenosis.  5. The aortic valve is grossly normal. Aortic valve regurgitation is not visualized. No aortic stenosis is present. FINDINGS  Left Ventricle: Left ventricular ejection fraction, by estimation, is 65 to 70%. The left ventricle has normal function. The left ventricle has no regional wall motion abnormalities. The left ventricular internal cavity size was normal in size. There is  no left ventricular hypertrophy. Left ventricular diastolic parameters were normal. Right Ventricle: McConnell sign present suggestive of acute PE. Cannot definitively rule out thrombus in transit  versus artifact in RV apex. The right ventricular size is severely enlarged. No increase in right ventricular wall thickness. Right ventricular systolic function is severely reduced. Left Atrium: Left atrial size was normal in size. Right Atrium: Right atrial size was mildly dilated. Pericardium: There is no evidence of pericardial effusion. Mitral Valve: The mitral valve is grossly normal. No evidence of mitral valve regurgitation. No evidence of mitral valve stenosis. Tricuspid Valve: The tricuspid valve is grossly normal. Tricuspid valve regurgitation is trivial. Aortic Valve: The aortic valve is grossly normal. Aortic valve regurgitation is not visualized. No aortic stenosis is present. Pulmonic Valve: The pulmonic valve was grossly normal. Pulmonic valve regurgitation is not visualized. Aorta: The aortic root is normal in size and structure. IAS/Shunts: No atrial level shunt detected by color flow Doppler.  LEFT VENTRICLE PLAX 2D LVIDd:         5.40 cm   Diastology LVIDs:         2.80 cm   LV e' medial:    6.20 cm/s LV PW:         1.00 cm   LV E/e' medial:  7.0 LV IVS:        0.60 cm   LV e' lateral:   6.26 cm/s LVOT diam:     2.30 cm   LV E/e' lateral: 6.9 LV SV:         49 LV SV Index:   19 LVOT Area:     4.15 cm  RIGHT VENTRICLE RV Basal diam:  5.80 cm RV S prime:     14.37 cm/s TAPSE (M-mode): 2.0 cm LEFT ATRIUM             Index        RIGHT ATRIUM           Index LA diam:  3.90 cm 1.48 cm/m   RA Area:     22.40 cm LA Vol (A2C):   33.1 ml 12.57 ml/m  RA Volume:   77.90 ml  29.58 ml/m LA Vol (A4C):   31.1 ml 11.81 ml/m LA Biplane Vol: 34.9 ml 13.25 ml/m  AORTIC VALVE LVOT Vmax:   86.57 cm/s LVOT Vmean:  55.133 cm/s LVOT VTI:    0.119 m  AORTA Ao Root diam: 3.80 cm Ao Asc diam:  3.10 cm MITRAL VALVE MV Area (PHT): 4.06 cm    SHUNTS MV Decel Time: 187 msec    Systemic VTI:  0.12 m MV E velocity: 43.47 cm/s  Systemic Diam: 2.30 cm MV A velocity: 61.83 cm/s MV E/A ratio:  0.70 Sabina Custovic  Electronically signed by Clotilde Dieter Signature Date/Time: 06/15/2023/12:58:35 PM    Final    PERIPHERAL VASCULAR CATHETERIZATION  Result Date: 06/14/2023 See surgical note for result.  US Venous Img Lower Bilateral (DVT)  Result Date: 06/14/2023 CLINICAL DATA:  Pulmonary embolism.  Assess for residual DVT. EXAM: BILATERAL LOWER EXTREMITY VENOUS DOPPLER ULTRASOUND TECHNIQUE: Gray-scale sonography with graded compression, as well as color Doppler and duplex ultrasound were performed to evaluate the lower extremity deep venous systems from the level of the common femoral vein and including the common femoral, femoral, profunda femoral, popliteal and calf veins including the posterior tibial, peroneal and gastrocnemius veins when visible. The superficial great saphenous vein was also interrogated. Spectral Doppler was utilized to evaluate flow at rest and with distal augmentation maneuvers in the common femoral, femoral and popliteal veins. COMPARISON:  None Available. FINDINGS: RIGHT LOWER EXTREMITY Common Femoral Vein: No evidence of thrombus. Normal compressibility, respiratory phasicity and response to augmentation. Saphenofemoral Junction: No evidence of thrombus. Normal compressibility and flow on color Doppler imaging. Profunda Femoral Vein: No evidence of thrombus. Normal compressibility and flow on color Doppler imaging. Femoral Vein: No evidence of thrombus. Normal compressibility, respiratory phasicity and response to augmentation. Popliteal Vein: No evidence of thrombus. Normal compressibility, respiratory phasicity and response to augmentation. Calf Veins: No evidence of thrombus. Normal compressibility and flow on color Doppler imaging. Superficial Great Saphenous Vein: No evidence of thrombus. Normal compressibility. Venous Reflux:  None. Other Findings:  None. LEFT LOWER EXTREMITY Common Femoral Vein: No evidence of thrombus. Normal compressibility, respiratory phasicity and response to  augmentation. Saphenofemoral Junction: No evidence of thrombus. Normal compressibility and flow on color Doppler imaging. Profunda Femoral Vein: No evidence of thrombus. Normal compressibility and flow on color Doppler imaging. Femoral Vein: No evidence of thrombus. Normal compressibility, respiratory phasicity and response to augmentation. Popliteal Vein: Noncompressible. Thrombus is present within the popliteal vein. Minimal peripheral flow on color Doppler imaging. Calf Veins: No evidence of thrombus. Normal compressibility and flow on color Doppler imaging. Superficial Great Saphenous Vein: No evidence of thrombus. Normal compressibility. Venous Reflux:  None. Other Findings:  None. IMPRESSION: 1. Positive for nonocclusive thrombus within the left popliteal vein. 2. No evidence of deep venous thrombus within the right lower extremity. Electronically Signed   By: Malachy Moan M.D.   On: 06/14/2023 08:10   CT Angio Chest PE W/Cm &/Or Wo Cm  Result Date: 06/13/2023 CLINICAL DATA:  Shortness of breath EXAM: CT ANGIOGRAPHY CHEST WITH CONTRAST TECHNIQUE: Multidetector CT imaging of the chest was performed using the standard protocol during bolus administration of intravenous contrast. Multiplanar CT image reconstructions and MIPs were obtained to evaluate the vascular anatomy. RADIATION DOSE REDUCTION: This exam was performed according to the departmental dose-optimization  program which includes automated exposure control, adjustment of the mA and/or kV according to patient size and/or use of iterative reconstruction technique. CONTRAST:  OMNIPAQUE IOHEXOL 350 MG/ML SOLN COMPARISON:  Chest radiograph 06/13/2023 and CTA chest 02/03/2018 FINDINGS: Cardiovascular: Large bilateral pulmonary emboli originating within the right and left main pulmonary artery extending into multiple lobar, segmental, and subsegmental branches. There is evidence of right heart strain with RV/LV ratio of 1.7. No pericardial  effusion. Mediastinum/Nodes: Trachea and esophagus are unremarkable. No thoracic adenopathy Lungs/Pleura: No focal consolidation, pleural effusion, or pneumothorax. Upper Abdomen: No acute abnormality. Musculoskeletal: No acute fracture. Review of the MIP images confirms the above findings. IMPRESSION: Large burden of pulmonary emboli originating in the bilateral main pulmonary arteries extending into the lobar, segmental and subsegmental branches. Right heart strain with RV/LV ratio of 1.7. Critical Value/emergent results were called by telephone at the time of interpretation on 06/13/2023 at 11:23 pm to provider [facility] , who verbally acknowledged these results. Electronically Signed   By: Minerva Fester M.D.   On: 06/13/2023 23:31        Scheduled Meds:  allopurinol  300 mg Oral Daily   Chlorhexidine Gluconate Cloth  6 each Topical Daily   dapagliflozin propanediol  10 mg Oral Daily   insulin aspart  0-15 Units Subcutaneous TID WC   insulin aspart  0-5 Units Subcutaneous QHS   nicotine  21 mg Transdermal Daily   omeprazole  20 mg Oral Daily   sodium chloride flush  3 mL Intravenous Q12H   Continuous Infusions:  heparin 2,050 Units/hr (06/15/23 1327)     LOS: 2 days     Pennie Banter, DO [facility]   If 7PM-7AM, please contact night-coverage [email or web address] Password [facility] 06/15/2023, 4:04 PM

## 2023-06-15 NOTE — Progress Notes (Signed)
Transition of Care [facility]) - Progression Note    Patient Details  Name: Ronald Pratt MRN: [id number] Date of Birth: 04/29/1976  Transition of Care [facility] CM/SW Contact  Truddie Hidden, RN Phone Number: 06/15/2023, 2:51 PM  Clinical Narrative:    TOC continuing ongoing assessments for needs that may arise and changes in discharge plan.        Expected Discharge Plan and Services                                               Social Determinants of Health (SDOH) Interventions SDOH Screenings   Food Insecurity: No Food Insecurity (06/14/2023)  Housing: Low Risk  (06/14/2023)  Transportation Needs: No Transportation Needs (06/14/2023)  Utilities: Not At Risk (06/14/2023)  Tobacco Use: High Risk (06/14/2023)    Readmission Risk Interventions     No data to display

## 2023-06-15 NOTE — Progress Notes (Signed)
ANTICOAGULATION CONSULT NOTE  Pharmacy Consult for Heparin Infusion Indication: pulmonary embolus  No Known Allergies  Patient Measurements: Height: 6\' 1"  (185.4 cm) Weight: (!) 143.5 kg (316 lb 5.8 oz) IBW/kg (Calculated) : 79.9 Heparin Dosing Weight: 115.5 kg  Vital Signs: Temp: 97.7 F (36.5 C) (08/21 0447) Temp Source: Oral (08/21 0447) BP: 108/68 (08/21 0500) Pulse Rate: 96 (08/21 0447)  Labs: Recent Labs    06/13/23 1549 06/13/23 1757 06/13/23 2328 06/14/23 0649 06/14/23 1413 06/14/23 2149 06/15/23 0634  HGB 17.2*  --   --   --   --   --  15.1  HCT 51.6  --   --   --   --   --  45.6  PLT 240  --   --   --   --   --  203  APTT  --   --  28  --   --   --   --   LABPROT  --   --  14.6  --   --   --   --   INR  --   --  1.1  --   --   --   --   HEPARINUNFRC  --   --   --    < > 0.27* 0.67 0.72*  CREATININE 1.65*  --   --   --   --   --   --   TROPONINIHS 27* 27*  --   --   --   --   --    < > = values in this interval not displayed.    Estimated Creatinine Clearance: 83.3 mL/min (A) (by C-G formula based on SCr of 1.65 mg/dL (H)).   Medical History: Past Medical History:  Diagnosis Date   CHF (congestive heart failure) (HCC)    Diverticulosis large intestine w/o perforation or abscess w/o bleeding 12/11/2016   Dysrhythmia    tacchycardia new..  put on metoprolol by dr. 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   Umbilical hernia     Assessment: Ronald Pratt is a 47 y.o. male presenting with SOB. PMH significant for HTN, TUD, sCHF, COPD, diverticulosis. Patient was not on [facility] PTA per chart review. CTA with large burden of PE extending from bilateral middle pulmonary arteries all the way to the subsegmental branches with RV LV ratio of 1.7. Vascular evaluating for intervention. Pharmacy has been  consulted to initiate and manage heparin infusion.   Baseline Labs: Hgb 17.2, Hct 51.6, Plt 240   Goal of Therapy:  Heparin level 0.3-0.7 units/ml Monitor platelets by anticoagulation protocol: Yes   Date Time HL Rate/Comment  8/20 0649 0.54 1900/therapeutic x1 8/20 2149 0.67 Therapeutic x 1 8/21 0634 0.72 Supratherapeutic  Plan:  Decrease heparin infusion rate to 2050 units/hr Recheck heparin level in 6 hr after rate change Continue to monitor H&H and platelets daily while on heparin infusion   Otelia Sergeant, PharmD, MBA 06/15/2023 7:00 AM

## 2023-06-16 ENCOUNTER — Other Ambulatory Visit: Payer: Self-pay

## 2023-06-16 DIAGNOSIS — I2602 Saddle embolus of pulmonary artery with acute cor pulmonale: Secondary | ICD-10-CM | POA: Diagnosis not present

## 2023-06-16 LAB — BASIC METABOLIC PANEL
Anion gap: 9 (ref 5–15)
BUN: 25 mg/dL — ABNORMAL HIGH (ref 6–20)
CO2: 25 mmol/L (ref 22–32)
Calcium: 8.8 mg/dL — ABNORMAL LOW (ref 8.9–10.3)
Chloride: 104 mmol/L (ref 98–111)
Creatinine, Ser: 1.32 mg/dL — ABNORMAL HIGH (ref 0.61–1.24)
GFR, Estimated: >60 mL/min (ref >60)
Glucose, Bld: 126 mg/dL — ABNORMAL HIGH (ref 70–99)
Potassium: 3.4 mmol/L — ABNORMAL LOW (ref 3.5–5.1)
Sodium: 138 mmol/L (ref 135–145)

## 2023-06-16 LAB — CBC
HCT: 41.5 % (ref 39.0–52.0)
Hemoglobin: 13.8 g/dL (ref 13.0–17.0)
MCH: 31.7 pg (ref 26.0–34.0)
MCHC: 33.3 g/dL (ref 30.0–36.0)
MCV: 95.2 fL (ref 80.0–100.0)
Platelets: 201 10*3/uL (ref 150–400)
RBC: 4.36 MIL/uL (ref 4.22–5.81)
RDW: 12.6 % (ref 11.5–15.5)
WBC: 9.3 10*3/uL (ref 4.0–10.5)
nRBC: 0 % (ref 0.0–0.2)

## 2023-06-16 LAB — MAGNESIUM: Magnesium: 2.2 mg/dL (ref 1.7–2.4)

## 2023-06-16 LAB — GLUCOSE, CAPILLARY
Glucose-Capillary: 142 mg/dL — ABNORMAL HIGH (ref 70–99)
Glucose-Capillary: 113 mg/dL — ABNORMAL HIGH (ref 70–99)
Glucose-Capillary: 169 mg/dL — ABNORMAL HIGH (ref 70–99)

## 2023-06-16 MED ORDER — APIXABAN (ELIQUIS) VTE STARTER PACK (10MG AND 5MG)
ORAL_TABLET | ORAL | 0 refills | Status: DC
  Filled 2023-06-16: qty 74, 28d supply, fill #0

## 2023-06-16 MED ORDER — POTASSIUM CHLORIDE CRYS ER 20 MEQ PO TBCR
40.0000 meq | EXTENDED_RELEASE_TABLET | Freq: Once | ORAL | Status: AC
  Administered 2023-06-16: 40 meq via ORAL
  Filled 2023-06-16: qty 2

## 2023-06-16 MED ORDER — TORSEMIDE 20 MG PO TABS
40.0000 mg | ORAL_TABLET | Freq: Every day | ORAL | Status: DC
  Administered 2023-06-16: 40 mg via ORAL
  Filled 2023-06-16: qty 2

## 2023-06-16 NOTE — Discharge Summary (Signed)
Physician Discharge Summary   Patient: Ronald Pratt MRN: [id number] DOB: 11-Aug-1976  Admit date:     06/13/2023  Discharge date: 06/16/2023  Discharge Physician: Pennie Banter   PCP: Barbette Reichmann, MD   Recommendations at discharge:    Follow up with Primary Care Repeat CBC, BMP  Patient needs home CPAP arranged Follow pending labs for hypercoagulable work up Follow up BP's - cardiac meds were held for soft BP's, resumed on admission with hold instructions provided to patient. Adjust regimen if needed.  Discharge Diagnoses: Principal Problem:   Acute saddle pulmonary embolism with acute cor pulmonale (HCC) Active Problems:   Saddle embolus of pulmonary artery with acute cor pulmonale (HCC)   Chronic systolic CHF (congestive heart failure) (HCC)   COPD (chronic obstructive pulmonary disease) (HCC)   Obstructive sleep apnea   Obesity, Class III, BMI 40-49.9 (morbid obesity) (HCC)   Benign essential hypertension   Tobacco use disorder   Seizures (HCC)   T2DM (type 2 diabetes mellitus) (HCC)   Acute pulmonary embolism with acute cor pulmonale (HCC)   SOB (shortness of breath)  Resolved Problems:   * No resolved hospital problems. [facility] Course:  From admission h and p   "Ronald Pratt is a 47 y.o. male with medical history significant for COPD, nicotine dependence, OSA not on CPAP, HFrEF(EF 45 to 50% 06/2022) who presents today with a 2-day history of progressively worsening shortness of breath associated with lightheadedness and feeling like he is going to pass out when ambulating.  He denies cough, fever or chills.  He has no chest pain.  He has a cough that is nonproductive and has no fever or chills.  He denies lower extremity pain or swelling."  Further hospital course and management as outlined below.   06/16/23: pt feels well.  He was ambulated again today for assessment of home oxygen needs.  He no longer desaturates while on room air at rest or with ambulation.   He does qualify for home nocturnal oxygen, due to underlying OSA.  This was ordered, but I was informed later pt's insurance declined coverage given previously having CPAP.  Pt will need to follow up for sleep study & obtaining new CPAP.   Assessment and Plan:  # Acute pulmonary embolus # Acute DVT Unprovoked. Large clot burden in bilateral main pulmonary arteries with evidence right heart strain. PVL with thrombus left popliteal vein. No family history but is in poor health.  Smoking is risk factor. - vascular surgery was consutled - underwent mechanical thrombectomy/thrombolysis  - initially on IV heparin >> Eliquis - follow pending thrombophilia labs    #Hypokalemia - K 3.4, replaced - Monitor BMP   # HFmrEF with acute decompensation EF 45-50% most recent TTE, here with evidence right heart strain on CT, dyspnea - on torsemide 80 at home, will hold - received lasix 40 in the ER, 60 IV again on 8/20 - monitor volume status - daily weights - entresto, metop, losartan were initially held due to soft BP's - farxiga resumed - held meds resumed & pt given BP hold parameters for meds if BP is soft - FOLLOW UP BP's and if pt has needed to hold meds, adjust regimen as needed   # AKI Cr 1.65 from baseline of ~1.2 suspect 2/2 chf - improved - monitor BMP at follow up   # Acute hypoxic respiratory failure - O2 sat 88% at rest on room air today 8/21 - supplement O2 if  sats < 88% on room air - check ambulatory O2 sats - improved, does not qualify for home o2  - nocturnal pulse ox study done - qualifies for nighttime O2 -- ordered    # OSA / Nocturnal Hypoxia Reports not being on cpap due to insurance not covering it - cpap while inpatient - outpatient follow up for sleep study & set up for home CPAP - nocturnal pulse ox study as above   # COPD - stable, no exacerbated Appear not to be on maintenance meds - monitor   # T2DM Most recent A1c 8.5% last month - Resume farxiga -  covered with SSI - resume home regimen at d/c   # HTN -- BP's were soft during admission, see above - resumed home meds as above   # Tobacco abuse - smoking is VTE risk factor - nicotine patch - pt expresses commitment to cessation   # Obesity - Body mass index is 41.74 kg/m. Complicates overall care and prognosis.  Recommend lifestyle modifications including physical activity and diet for weight loss and overall long-term health.          Consultants: vascular surgery Procedures performed: mechanical thrombectomy/thrombolysis  Disposition: Home Diet recommendation:  Cardiac and Carb modified diet DISCHARGE MEDICATION: Allergies as of 06/16/2023   No Known Allergies      Medication List     STOP taking these medications    predniSONE 10 MG tablet Commonly known as: DELTASONE       TAKE these medications    allopurinol 300 MG tablet Commonly known as: ZYLOPRIM Take 300 mg by mouth daily.   colchicine 0.6 MG tablet Take 0.6-1.2 mg by mouth See admin instructions. Take 2 tablets (1.2mg ) by mouth at onset of gout flare - take 1 additional tablet (0.6mg ) by mouth after an hour if needed   dapagliflozin propanediol 10 MG Tabs tablet Commonly known as: FARXIGA Take 1 tablet by mouth daily.   Eliquis DVT/PE Starter Pack Generic drug: Apixaban Starter Pack (10mg  and 5mg ) Take as directed on package: start with two-5mg  tablets twice daily for 7 days. On day 8, switch to one-5mg  tablet twice daily.   Entresto 97-103 MG Generic drug: sacubitril-valsartan Take 1 tablet by mouth 2 (two) times daily.   metoprolol succinate 25 MG 24 hr tablet Commonly known as: TOPROL-XL Take 1 tablet (25 mg total) by mouth daily.   omeprazole 20 MG tablet Commonly known as: PRILOSEC OTC Take 20 mg by mouth daily.   potassium chloride SA 20 MEQ tablet Commonly known as: KLOR-CON M Take 1 tablet (20 mEq total) by mouth daily.   torsemide 10 MG tablet Commonly known as:  DEMADEX Take 40 mg by mouth daily. Taking 2 tablets in am and 2 tablets in pm        Follow-up Information     Barbette Reichmann, MD.   Specialty: Internal Medicine Contact information: [address] [place] [address] [phone]                Discharge Exam: Ceasar Mons Weights   06/13/23 2321 06/14/23 0915 06/15/23 0447  Weight: (!) 152 kg (!) 146 kg (!) 143.5 kg   General exam: awake, alert, no acute distress, obese HEENT: atraumatic, clear conjunctiva, anicteric sclera, moist mucus membranes, hearing grossly normal  Respiratory system: CTAB, no wheezes, rales or rhonchi, normal respiratory effort. Cardiovascular system: normal S1/S2, RRR, no JVD, murmurs, rubs, gallops,  no pedal edema.   Gastrointestinal system: soft,  NT, ND, no HSM felt, +bowel sounds. Central nervous system: A&O x 4. no gross focal neurologic deficits, normal speech Extremities: moves all , stable trace BLE edema Psychiatry: normal mood, congruent affect, judgement and insight appear normal   Condition at discharge: stable  The results of significant diagnostics from this hospitalization (including imaging, microbiology, ancillary and laboratory) are listed below for reference.   Imaging Studies: ECHOCARDIOGRAM COMPLETE  Result Date: 06/15/2023    ECHOCARDIOGRAM REPORT   Patient Name:   Ronald Pratt Date of Exam: 06/14/2023 Medical Rec #:  [id number]    Height:       73.0 in Accession #:    [id number]   Weight:       321.9 lb Date of Birth:  August 14, 1976     BSA:          2.634 m Patient Age:    46 years     BP:           104/77 mmHg Patient Gender: M            HR:           101 bpm. Exam Location:  [facility] Procedure: 2D Echo, Cardiac Doppler and Color Doppler Indications:     I26.09 Pulmonary Embolus  History:         Patient has prior history of Echocardiogram examinations, most                  recent 02/27/2020. CHF; Risk Factors:Hypertension. Obstructive                   sleep apnea.  Sonographer:     Daphine Deutscher RDCS Referring Phys:  [id number] Marlow Baars DEW Diagnosing Phys: Rozell Searing Custovic IMPRESSIONS  1. Left ventricular ejection fraction, by estimation, is 65 to 70%. The left ventricle has normal function. The left ventricle has no regional wall motion abnormalities. Left ventricular diastolic parameters were normal.  2. McConnell sign present suggestive of acute PE. Cannot definitively rule out thrombus in transit versus artifact in RV apex.. Right ventricular systolic function is severely reduced. The right ventricular size is severely enlarged.  3. Right atrial size was mildly dilated.  4. The mitral valve is grossly normal. No evidence of mitral valve regurgitation. No evidence of mitral stenosis.  5. The aortic valve is grossly normal. Aortic valve regurgitation is not visualized. No aortic stenosis is present. FINDINGS  Left Ventricle: Left ventricular ejection fraction, by estimation, is 65 to 70%. The left ventricle has normal function. The left ventricle has no regional wall motion abnormalities. The left ventricular internal cavity size was normal in size. There is  no left ventricular hypertrophy. Left ventricular diastolic parameters were normal. Right Ventricle: McConnell sign present suggestive of acute PE. Cannot definitively rule out thrombus in transit versus artifact in RV apex. The right ventricular size is severely enlarged. No increase in right ventricular wall thickness. Right ventricular systolic function is severely reduced. Left Atrium: Left atrial size was normal in size. Right Atrium: Right atrial size was mildly dilated. Pericardium: There is no evidence of pericardial effusion. Mitral Valve: The mitral valve is grossly normal. No evidence of mitral valve regurgitation. No evidence of mitral valve stenosis. Tricuspid Valve: The tricuspid valve is grossly normal. Tricuspid valve regurgitation is trivial. Aortic Valve: The aortic valve is grossly  normal. Aortic valve regurgitation is not visualized. No aortic stenosis is present. Pulmonic Valve: The pulmonic valve was grossly normal. Pulmonic valve regurgitation is not visualized.  Aorta: The aortic root is normal in size and structure. IAS/Shunts: No atrial level shunt detected by color flow Doppler.  LEFT VENTRICLE PLAX 2D LVIDd:         5.40 cm   Diastology LVIDs:         2.80 cm   LV e' medial:    6.20 cm/s LV PW:         1.00 cm   LV E/e' medial:  7.0 LV IVS:        0.60 cm   LV e' lateral:   6.26 cm/s LVOT diam:     2.30 cm   LV E/e' lateral: 6.9 LV SV:         49 LV SV Index:   19 LVOT Area:     4.15 cm  RIGHT VENTRICLE RV Basal diam:  5.80 cm RV S prime:     14.37 cm/s TAPSE (M-mode): 2.0 cm LEFT ATRIUM             Index        RIGHT ATRIUM           Index LA diam:        3.90 cm 1.48 cm/m   RA Area:     22.40 cm LA Vol (A2C):   33.1 ml 12.57 ml/m  RA Volume:   77.90 ml  29.58 ml/m LA Vol (A4C):   31.1 ml 11.81 ml/m LA Biplane Vol: 34.9 ml 13.25 ml/m  AORTIC VALVE LVOT Vmax:   86.57 cm/s LVOT Vmean:  55.133 cm/s LVOT VTI:    0.119 m  AORTA Ao Root diam: 3.80 cm Ao Asc diam:  3.10 cm MITRAL VALVE MV Area (PHT): 4.06 cm    SHUNTS MV Decel Time: 187 msec    Systemic VTI:  0.12 m MV E velocity: 43.47 cm/s  Systemic Diam: 2.30 cm MV A velocity: 61.83 cm/s MV E/A ratio:  0.70 Sabina Custovic Electronically signed by Clotilde Dieter Signature Date/Time: 06/15/2023/12:58:35 PM    Final    PERIPHERAL VASCULAR CATHETERIZATION  Result Date: 06/14/2023 See surgical note for result.  US Venous Img Lower Bilateral (DVT)  Result Date: 06/14/2023 CLINICAL DATA:  Pulmonary embolism.  Assess for residual DVT. EXAM: BILATERAL LOWER EXTREMITY VENOUS DOPPLER ULTRASOUND TECHNIQUE: Gray-scale sonography with graded compression, as well as color Doppler and duplex ultrasound were performed to evaluate the lower extremity deep venous systems from the level of the common femoral vein and including the common  femoral, femoral, profunda femoral, popliteal and calf veins including the posterior tibial, peroneal and gastrocnemius veins when visible. The superficial great saphenous vein was also interrogated. Spectral Doppler was utilized to evaluate flow at rest and with distal augmentation maneuvers in the common femoral, femoral and popliteal veins. COMPARISON:  None Available. FINDINGS: RIGHT LOWER EXTREMITY Common Femoral Vein: No evidence of thrombus. Normal compressibility, respiratory phasicity and response to augmentation. Saphenofemoral Junction: No evidence of thrombus. Normal compressibility and flow on color Doppler imaging. Profunda Femoral Vein: No evidence of thrombus. Normal compressibility and flow on color Doppler imaging. Femoral Vein: No evidence of thrombus. Normal compressibility, respiratory phasicity and response to augmentation. Popliteal Vein: No evidence of thrombus. Normal compressibility, respiratory phasicity and response to augmentation. Calf Veins: No evidence of thrombus. Normal compressibility and flow on color Doppler imaging. Superficial Great Saphenous Vein: No evidence of thrombus. Normal compressibility. Venous Reflux:  None. Other Findings:  None. LEFT LOWER EXTREMITY Common Femoral Vein: No evidence of thrombus. Normal compressibility, respiratory phasicity  and response to augmentation. Saphenofemoral Junction: No evidence of thrombus. Normal compressibility and flow on color Doppler imaging. Profunda Femoral Vein: No evidence of thrombus. Normal compressibility and flow on color Doppler imaging. Femoral Vein: No evidence of thrombus. Normal compressibility, respiratory phasicity and response to augmentation. Popliteal Vein: Noncompressible. Thrombus is present within the popliteal vein. Minimal peripheral flow on color Doppler imaging. Calf Veins: No evidence of thrombus. Normal compressibility and flow on color Doppler imaging. Superficial Great Saphenous Vein: No evidence of  thrombus. Normal compressibility. Venous Reflux:  None. Other Findings:  None. IMPRESSION: 1. Positive for nonocclusive thrombus within the left popliteal vein. 2. No evidence of deep venous thrombus within the right lower extremity. Electronically Signed   By: Malachy Moan M.D.   On: 06/14/2023 08:10   CT Angio Chest PE W/Cm &/Or Wo Cm  Result Date: 06/13/2023 CLINICAL DATA:  Shortness of breath EXAM: CT ANGIOGRAPHY CHEST WITH CONTRAST TECHNIQUE: Multidetector CT imaging of the chest was performed using the standard protocol during bolus administration of intravenous contrast. Multiplanar CT image reconstructions and MIPs were obtained to evaluate the vascular anatomy. RADIATION DOSE REDUCTION: This exam was performed according to the departmental dose-optimization program which includes automated exposure control, adjustment of the mA and/or kV according to patient size and/or use of iterative reconstruction technique. CONTRAST:  OMNIPAQUE IOHEXOL 350 MG/ML SOLN COMPARISON:  Chest radiograph 06/13/2023 and CTA chest 02/03/2018 FINDINGS: Cardiovascular: Large bilateral pulmonary emboli originating within the right and left main pulmonary artery extending into multiple lobar, segmental, and subsegmental branches. There is evidence of right heart strain with RV/LV ratio of 1.7. No pericardial effusion. Mediastinum/Nodes: Trachea and esophagus are unremarkable. No thoracic adenopathy Lungs/Pleura: No focal consolidation, pleural effusion, or pneumothorax. Upper Abdomen: No acute abnormality. Musculoskeletal: No acute fracture. Review of the MIP images confirms the above findings. IMPRESSION: Large burden of pulmonary emboli originating in the bilateral main pulmonary arteries extending into the lobar, segmental and subsegmental branches. Right heart strain with RV/LV ratio of 1.7. Critical Value/emergent results were called by telephone at the time of interpretation on 06/13/2023 at 11:23 pm to provider  [facility] , who verbally acknowledged these results. Electronically Signed   By: Minerva Fester M.D.   On: 06/13/2023 23:31   DG Chest 2 View  Result Date: 06/13/2023 CLINICAL DATA:  Shortness of breath. EXAM: CHEST - 2 VIEW COMPARISON:  Radiograph 02/27/2020 FINDINGS: Borderline cardiomegaly. Unchanged mediastinal contours. No focal airspace disease. No pleural fluid or pneumothorax. No pulmonary edema. No acute osseous findings. IMPRESSION: Borderline cardiomegaly. No acute pulmonary process. Electronically Signed   By: Narda Rutherford M.D.   On: 06/13/2023 16:40    Microbiology: Results for orders placed or performed during the hospital encounter of 06/13/23  SARS Coronavirus 2 by RT PCR (hospital order, performed in [facility] hospital lab) *cepheid single result test* Anterior Nasal Swab     Status: None   Collection Time: 06/13/23  5:57 PM   Specimen: Anterior Nasal Swab  Result Value Ref Range Status   SARS Coronavirus 2 by RT PCR NEGATIVE NEGATIVE Final    Comment: (NOTE) SARS-CoV-2 target nucleic acids are NOT DETECTED.  The SARS-CoV-2 RNA is generally detectable in upper and lower respiratory specimens during the acute phase of infection. The lowest concentration of SARS-CoV-2 viral copies this assay can detect is 250 copies / mL. A negative result does not preclude SARS-CoV-2 infection and should not be used as the sole basis for treatment or other patient management  decisions.  A negative result may occur with improper specimen collection / handling, submission of specimen other than nasopharyngeal swab, presence of viral mutation(s) within the areas targeted by this assay, and inadequate number of viral copies (<250 copies / mL). A negative result must be combined with clinical observations, patient history, and epidemiological information.  Fact Sheet for Patients:   RoadLapTop.co.za  Fact Sheet for Healthcare  Providers: http://kim-miller.com/  This test is not yet approved or  cleared by the [place] FDA and has been authorized for detection and/or diagnosis of SARS-CoV-2 by FDA under an Emergency Use Authorization (EUA).  This EUA will remain in effect (meaning this test can be used) for the duration of the COVID-19 declaration under Section 564(b)(1) of the Act, 21 U.S.C. section 360bbb-3(b)(1), unless the authorization is terminated or revoked sooner.  Performed at [facility], [address] [place], [place] [address]   MRSA Next Gen by PCR, Nasal     Status: None   Collection Time: 06/14/23 10:07 AM   Specimen: Nasal Mucosa; Nasal Swab  Result Value Ref Range Status   MRSA by PCR Next Gen NOT DETECTED NOT DETECTED Final    Comment: (NOTE) The GeneXpert MRSA Assay (FDA approved for NASAL specimens only), is one component of a comprehensive MRSA colonization surveillance program. It is not intended to diagnose MRSA infection nor to guide or monitor treatment for MRSA infections. Test performance is not FDA approved in patients less than 98 years old. Performed at [facility], [address] [place], [place] [address]     Labs: CBC: No results for input(s): "WBC", "NEUTROABS", "HGB", "HCT", "MCV", "PLT" in the last 168 hours.  Basic Metabolic Panel: Recent Labs  Lab 06/21/23 1509  NA 134*  K 3.5  CL 103  CO2 26  GLUCOSE 64*  BUN 25*  CREATININE 1.60*  CALCIUM 9.0   Liver Function Tests: No results for input(s): "AST", "ALT", "ALKPHOS", "BILITOT", "PROT", "ALBUMIN" in the last 168 hours. CBG: No results for input(s): "GLUCAP" in the last 168 hours.   Discharge time spent: greater than 30 minutes.  Signed: Pennie Banter, DO [facility] 06/24/2023

## 2023-06-16 NOTE — Plan of Care (Signed)
  Problem: Education: Goal: Knowledge of condition and prescribed therapy will improve Outcome: Progressing   Problem: Cardiac: Goal: Will achieve and/or maintain adequate cardiac output Outcome: Progressing   Problem: Physical Regulation: Goal: Complications related to the disease process, condition or treatment will be avoided or minimized Outcome: Progressing   Problem: Education: Goal: Ability to describe self-care measures that may prevent or decrease complications (Diabetes Survival Skills Education) will improve Outcome: Progressing Goal: Individualized Educational Video(s) Outcome: Progressing

## 2023-06-16 NOTE — Progress Notes (Signed)
SATURATION QUALIFICATIONS: (This note is used to comply with regulatory documentation for home oxygen)  Patient Saturations on Room Air at Rest = 94%  Patient Saturations on Room Air while Ambulating = 92%  

## 2023-06-16 NOTE — TOC Transition Note (Addendum)
Transition of Care [facility]) - CM/SW Discharge Note   Patient Details  Name: Ronald Pratt MRN: [id number] Date of Birth: [birth year]  Transition of Care [facility]) CM/SW Contact:  Truddie Hidden, RN Phone Number: 06/16/2023, 4:16 PM   Clinical Narrative:    Reqest for nocturnal oxygen sent to [facility] from [facility] @ [phone] Nocturnal oximetry faxed to [phone] Morrie Sheldon from [facility] confirmed faxed was received and order has been placed Oxygen will be delivered today  Spoke with patient. He was advised oxygen would be delivered to his home.   4:58pm Per Morrie Sheldon at [facility] oxygen is not covered due to patient history of OSA. He has had a CPAP in the past and was non compliant. Patient has been advised he would require outpatient testing and follow up with pulmonology to received another CPAP.    TOC signing off.          Patient Goals and CMS Choice      Discharge Placement                         Discharge Plan and Services Additional resources added to the After Visit Summary for                                       Social Determinants of Health (SDOH) Interventions SDOH Screenings   Food Insecurity: No Food Insecurity (06/14/2023)  Housing: Low Risk  (06/14/2023)  Transportation Needs: No Transportation Needs (06/14/2023)  Utilities: Not At Risk (06/14/2023)  Tobacco Use: High Risk (06/14/2023)     Readmission Risk Interventions     No data to display

## 2023-06-16 NOTE — Progress Notes (Signed)
[facility] HF Stewardship  PCP: Barbette Reichmann, MD  PCP-Cardiologist: None  HPI: Ronald Pratt is a 47 y.o. male with COPD, nicotine dependence, OSA not on CPAP, HFrEF(EF 45 to 50% 06/2022  who presented with a 2 day history of progressive shortness of breath and lightheadedness. CT scan on 8/20 CT revealed a large saddle pulmonary embolism with right heart strain (RV to LV ratio of 1.7). Received catheter directed tPA on 8/20 and is now on IV heparin. Echo 8/20 showed severe RV dysfunction and normal LVEF. Doppler on 8/20 positive for nonocclusive thrombus within the left popliteal vein. Hypercoaguable work-up in progress. BNP 801.7 on admission. Echocardiogram performed on 06/14/23 not yet read. Creatinine elevated on admission to 1.65, now trending down to 1.32.   Pertinent Lab Values: Creatinine, Ser  Date Value Ref Range Status  06/16/2023 1.32 (H) 0.61 - 1.24 mg/dL Final   BUN  Date Value Ref Range Status  06/16/2023 25 (H) 6 - 20 mg/dL Final   Potassium  Date Value Ref Range Status  06/16/2023 3.4 (L) 3.5 - 5.1 mmol/L Final   Sodium  Date Value Ref Range Status  06/16/2023 138 135 - 145 mmol/L Final   B Natriuretic Peptide  Date Value Ref Range Status  06/13/2023 801.7 (H) 0.0 - 100.0 pg/mL Final    Comment:    Performed at [facility], [address] [place], [place] [address]   Magnesium  Date Value Ref Range Status  06/16/2023 2.2 1.7 - 2.4 mg/dL Final    Comment:    Performed at [facility], [address] [place], [place] [address]   Hgb A1c MFr Bld  Date Value Ref Range Status  06/14/2023 7.4 (H) 4.8 - 5.6 % Final    Comment:    (NOTE) Pre diabetes:          5.7%-6.4%  Diabetes:              >6.4%  Glycemic control for   <7.0% adults with diabetes     Vital Signs: Temp:  [97.6 F (36.4 C)-98.1 F (36.7 C)] 97.9 F (36.6 C) (08/22 0755) Pulse Rate:  [54-95] 94 (08/22 0755) Cardiac Rhythm: Normal sinus rhythm (08/22 0735) Resp:   [16-20] 16 (08/22 0755) BP: (96-129)/(80-96) 120/96 (08/22 0755) SpO2:  [89 %-95 %] 90 % (08/22 0755)   Intake/Output Summary (Last 24 hours) at 06/16/2023 0809 Last data filed at 06/16/2023 0049 Gross per 24 hour  Intake 1422.24 ml  Output 400 ml  Net 1022.24 ml    Current Inpatient Medications:  -None  Prior to admission HF Medications:  Metoprolol succiante 25 mg daily Entresto 97-103 mg BID Farxiga 10 mg daily Torsemide 40 mg daily  No fill history for medications other than torsemide, however patient reports medication adherence. Suspect nonadherence due to cost.  Assessment: 1. Systolic heart failure (LVEF 65-70%, improved from 45-50% in 2021 and 2023, with severe RV dysfunction)-, due to presumed NICM. NYHA class II symptoms.  -Patient is feeling great today. He does not have any LEE or respiratory symptoms at rest. -BP stable, would benefit from restarting home Entresto at lower dose. On discharge. Can consider spironolactone given hypokalemia. -Given HFimpEF and RV failure, still benefits from GDMT. -Creatinine improved from 1.65 to 1.32. Weight down 5 lbs, but does not corroborate with urine output.   Plan: 1) Medication changes recommended at this time: -Consider starting spironolactone 12.5 mg daily.  2) Patient assistance: -Copay for Eliquis, Sherryll Burger, and [place] are  all $50. Qualifies for copay cards to reduce copays.  3) Education: -- Patient has been educated on current HF medications and potential additions to HF medication regimen - Patient verbalizes understanding that over the next few months, these medication doses may change and more medications may be added to optimize HF regimen - Patient has been educated on basic disease state pathophysiology and goals of therapy  Medication Assistance / Insurance Benefits Check:  Does the patient have prescription insurance?    Type of insurance plan:   Does the patient qualify for medication assistance  through manufacturers or grants? No  Outpatient Pharmacy:  Prior to admission outpatient pharmacy: [facility]   Is the patient willing to utilize a [facility] pharmacy at discharge?: Yes  Thank you for involving pharmacy in this patient's care.  Enos Fling, PharmD, BCPS Phone - [phone] Clinical Pharmacist 06/16/2023 8:09 AM

## 2023-06-16 NOTE — Plan of Care (Signed)

## 2023-06-17 LAB — TT MIX+TTN
THROMBIN NEUTRALIZATION: 21.3 s (ref 0.0–23.0)
THROMBIN TIME MIX: 42.8 s — ABNORMAL HIGH (ref 0.0–23.0)

## 2023-06-17 LAB — FACTOR 8 ASSAY: Coagulation Factor VIII: 228 % — ABNORMAL HIGH (ref 56–140)

## 2023-06-17 LAB — LUPUS ANTICOAGULANT
DRVVT: 33.3 s (ref 0.0–47.0)
PTT Lupus Anticoagulant: 32.8 s (ref 0.0–43.5)
Thrombin Time: 105.7 s — ABNORMAL HIGH (ref 0.0–23.0)
dPT Confirm Ratio: 0.89 ratio (ref 0.00–1.34)
dPT: 36.7 s (ref 0.0–47.6)

## 2023-06-17 LAB — PROTEIN C ACTIVITY: Protein C Activity: 96 % (ref 73–180)

## 2023-06-17 LAB — PROTEIN S, TOTAL AND FREE
Protein S Ag, Free: 103 % (ref 61–136)
Protein S Ag, Total: 121 % (ref 60–150)

## 2023-06-21 ENCOUNTER — Ambulatory Visit (HOSPITAL_BASED_OUTPATIENT_CLINIC_OR_DEPARTMENT_OTHER): Payer: Managed Care, Other (non HMO) | Admitting: Family

## 2023-06-21 ENCOUNTER — Other Ambulatory Visit
Admission: RE | Admit: 2023-06-21 | Discharge: 2023-06-21 | Disposition: A | Discharge status: Home / Self Care | Payer: Managed Care, Other (non HMO) | Source: Ambulatory Visit | Attending: Family | Admitting: Family

## 2023-06-21 ENCOUNTER — Encounter: Payer: Self-pay | Admitting: Family

## 2023-06-21 VITALS — BP 120/75 | HR 99 | Ht 73.0 in | Wt 334.0 lb

## 2023-06-21 DIAGNOSIS — I2782 Chronic pulmonary embolism: Secondary | ICD-10-CM

## 2023-06-21 DIAGNOSIS — R5383 Other fatigue: Secondary | ICD-10-CM | POA: Diagnosis present

## 2023-06-21 DIAGNOSIS — Z7901 Long term (current) use of anticoagulants: Secondary | ICD-10-CM | POA: Diagnosis not present

## 2023-06-21 DIAGNOSIS — I10 Essential (primary) hypertension: Secondary | ICD-10-CM | POA: Diagnosis not present

## 2023-06-21 DIAGNOSIS — M109 Gout, unspecified: Secondary | ICD-10-CM | POA: Diagnosis not present

## 2023-06-21 DIAGNOSIS — E119 Type 2 diabetes mellitus without complications: Secondary | ICD-10-CM | POA: Insufficient documentation

## 2023-06-21 DIAGNOSIS — I428 Other cardiomyopathies: Secondary | ICD-10-CM | POA: Insufficient documentation

## 2023-06-21 DIAGNOSIS — Z86718 Personal history of other venous thrombosis and embolism: Secondary | ICD-10-CM | POA: Insufficient documentation

## 2023-06-21 DIAGNOSIS — I2699 Other pulmonary embolism without acute cor pulmonale: Secondary | ICD-10-CM | POA: Insufficient documentation

## 2023-06-21 DIAGNOSIS — Z7984 Long term (current) use of oral hypoglycemic drugs: Secondary | ICD-10-CM | POA: Insufficient documentation

## 2023-06-21 DIAGNOSIS — I110 Hypertensive heart disease with heart failure: Secondary | ICD-10-CM | POA: Insufficient documentation

## 2023-06-21 DIAGNOSIS — Z79899 Other long term (current) drug therapy: Secondary | ICD-10-CM | POA: Diagnosis not present

## 2023-06-21 DIAGNOSIS — F17210 Nicotine dependence, cigarettes, uncomplicated: Secondary | ICD-10-CM | POA: Insufficient documentation

## 2023-06-21 DIAGNOSIS — I5033 Acute on chronic diastolic (congestive) heart failure: Secondary | ICD-10-CM | POA: Diagnosis not present

## 2023-06-21 DIAGNOSIS — G4733 Obstructive sleep apnea (adult) (pediatric): Secondary | ICD-10-CM

## 2023-06-21 DIAGNOSIS — Z716 Tobacco abuse counseling: Secondary | ICD-10-CM | POA: Diagnosis not present

## 2023-06-21 DIAGNOSIS — F17200 Nicotine dependence, unspecified, uncomplicated: Secondary | ICD-10-CM

## 2023-06-21 DIAGNOSIS — K219 Gastro-esophageal reflux disease without esophagitis: Secondary | ICD-10-CM | POA: Diagnosis not present

## 2023-06-21 DIAGNOSIS — I5032 Chronic diastolic (congestive) heart failure: Secondary | ICD-10-CM

## 2023-06-21 DIAGNOSIS — I2602 Saddle embolus of pulmonary artery with acute cor pulmonale: Secondary | ICD-10-CM

## 2023-06-21 LAB — BASIC METABOLIC PANEL
Anion gap: 5 (ref 5–15)
BUN: 25 mg/dL — ABNORMAL HIGH (ref 6–20)
CO2: 26 mmol/L (ref 22–32)
Calcium: 9 mg/dL (ref 8.9–10.3)
Chloride: 103 mmol/L (ref 98–111)
Creatinine, Ser: 1.6 mg/dL — ABNORMAL HIGH (ref 0.61–1.24)
GFR, Estimated: 53 mL/min — ABNORMAL LOW (ref >60)
Glucose, Bld: 64 mg/dL — ABNORMAL LOW (ref 70–99)
Potassium: 3.5 mmol/L (ref 3.5–5.1)
Sodium: 134 mmol/L — ABNORMAL LOW (ref 135–145)

## 2023-06-21 LAB — FACTOR 5 LEIDEN

## 2023-06-21 NOTE — Patient Instructions (Signed)
Go DOWN to LOWER LEVEL and have your blood work completed inside of [employer] office

## 2023-06-21 NOTE — Progress Notes (Signed)
Advanced Heart Failure Clinic Note   PCP: Barbette Reichmann, MD (last seen 08/24) Cardiologist: Dorothyann Peng, MD (last seen 09/23)  HPI:  Ronald Pratt is a 47 y/o male with a history of HTN, gout, GERD, seizures, obstructive sleep apnea (not wearing CPAP), PE (08/24), DVT, DM, previous tobacco use and chronic heart failure.   Admitted 02/27/20 due to acute on chronic HF and syncope. Cardiology consult obtained. Initially given IV lasix with transition to oral diuretics. Brain MRI was negative. Discharged the following day.   Admitted 06/13/23 due to progressively worsening shortness of breath associated with lightheadedness and feeling like he is going to pass out when ambulating. CT angio chest showing large burden of PE originating in the bilateral main pulmonary arteries extending into the lobar segmental and subsegmental branches with right heart strain, RV/LV ratio of 1.7. Received catheter directed tPA on 8/20 and then started on a heparin infusion and given a dose of Lasix.   Echo 02/03/18: EF of 55-60% along with mild Ronald with normal PA pressure. Echo 02/27/20: EF of 45-50% along with mild Ronald and mildly elevated PA pressure.   Echo 06/14/23: EF of 65-70% along with acute PE and right ventricular systolic function is severely reduced. The right ventricular size is severely enlarged.   He presents for a initial HF visit as he hasn't been seen since 02/2020. He presents with a chief complaint of minimal fatigue with moderate exertion. Has no other symptoms and specifically denies shortness of breath, chest pain, cough, palpitations, abdominal distention, pedal edema, dizziness, difficulty sleeping or weight gain.   Has sleep study ordered but is waiting to hear from them about a date/ time.   Has not smoked since his recent admission. Is drinking 160-210 oz of fluids daily. Does work outside in the heat.   Review of Systems: [y] = yes, [ ]  = no   General: Weight gain [ ] ; Weight loss [ ] ;  Anorexia [ ] ; Fatigue Cove.Etienne ]; Fever [ ] ; Chills [ ] ; Weakness [ ]   Cardiac: Chest pain/pressure [ ] ; Resting SOB [ ] ; Exertional SOB [ ] ; Orthopnea [ ] ; Pedal Edema [ ] ; Palpitations [ ] ; Syncope [ ] ; Presyncope [ ] ; Paroxysmal nocturnal dyspnea[ ]   Pulmonary: Cough [ ] ; Wheezing[ ] ; Hemoptysis[ ] ; Sputum [ ] ; Snoring [ ]   GI: Vomiting[ ] ; Dysphagia[ ] ; Melena[ ] ; Hematochezia [ ] ; Heartburn[ ] ; Abdominal pain [ ] ; Constipation [ ] ; Diarrhea [ ] ; BRBPR [ ]   GU: Hematuria[ ] ; Dysuria [ ] ; Nocturia[ ]   Vascular: Pain in legs with walking [ ] ; Pain in feet with lying flat [ ] ; Non-healing sores [ ] ; Stroke [ ] ; TIA [ ] ; Slurred speech [ ] ;  Neuro: Headaches[ ] ; Vertigo[ ] ; Seizures[ ] ; Paresthesias[ ] ;Blurred vision [ ] ; Diplopia [ ] ; Vision changes [ ]   Ortho/Skin: Arthritis [ ] ; Joint pain [ ] ; Muscle pain [ ] ; Joint swelling [ ] ; Back Pain [ ] ; Rash [ ]   Psych: Depression[ ] ; Anxiety[ ]   Heme: Bleeding problems [ ] ; Clotting disorders [ ] ; Anemia [ ]   Endocrine: Diabetes Cove.Etienne ]; Thyroid dysfunction[ ]    Past Medical History:  Diagnosis Date   CHF (congestive heart failure) (HCC)    Diverticulosis large intestine w/o perforation or abscess w/o bleeding 12/11/2016   Dysrhythmia    tacchycardia new..  put on metoprolol by dr. 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   Umbilical hernia     Current Outpatient Medications  Medication Sig Dispense Refill   allopurinol (ZYLOPRIM) 300 MG tablet Take 300 mg by mouth daily.      APIXABAN (ELIQUIS) VTE STARTER PACK (10MG  AND 5MG ) Take as directed on package: start with two-5mg  tablets twice daily for 7 days. On day 8, switch to one-5mg  tablet twice daily. 74 each 0   colchicine 0.6 MG tablet Take 0.6-1.2 mg by mouth See admin instructions. Take 2 tablets (1.2mg ) by mouth at onset of gout flare -  take 1 additional tablet (0.6mg ) by mouth after an hour if needed     dapagliflozin propanediol (FARXIGA) 10 MG TABS tablet Take 1 tablet by mouth daily.     metoprolol succinate (TOPROL-XL) 25 MG 24 hr tablet Take 1 tablet (25 mg total) by mouth daily. 90 tablet 0   omeprazole (PRILOSEC OTC) 20 MG tablet Take 20 mg by mouth daily.     potassium chloride SA (K-DUR,KLOR-CON) 20 MEQ tablet Take 1 tablet (20 mEq total) by mouth daily. 90 tablet 3   sacubitril-valsartan (ENTRESTO) 97-103 MG Take 1 tablet by mouth 2 (two) times daily.     torsemide (DEMADEX) 10 MG tablet Take 40 mg by mouth daily.     No current facility-administered medications for this visit.    No Known Allergies    Social History   Socioeconomic History   Marital status: Divorced    Spouse name: Not on file   Number of children: Not on file   Years of education: Not on file   Highest education level: Not on file  Occupational History   Not on file  Tobacco Use   Smoking status: Every Day    Current packs/day: 1.00    Average packs/day: 1 pack/day for 20.0 years (20.0 ttl pk-yrs)    Types: Cigarettes   Smokeless tobacco: Never  Substance and Sexual Activity   Alcohol use: No    Comment: SOBER SINCE 2010   Drug use: No   Sexual activity: Yes    Birth control/protection: None  Other Topics Concern   Not on file  Social History Narrative   Not on file   Social Determinants of Health   Financial Resource Strain: Not on file  Food Insecurity: No Food Insecurity (06/14/2023)   Hunger Vital Sign    Worried About Running Out of Food in the Last Year: Never true    Ran Out of Food in the Last Year: Never true  Transportation Needs: No Transportation Needs (06/14/2023)   PRAPARE - Administrator, Civil Service (Medical): No    Lack of Transportation (Non-Medical): No  Physical Activity: Not on file  Stress: Not on file  Social Connections: Not on file  Intimate Partner Violence: Not At Risk  (06/14/2023)   Humiliation, Afraid, Rape, and Kick questionnaire    Fear of Current or Ex-Partner: No    Emotionally Abused: No    Physically Abused: No    Sexually Abused: No      Family History  Problem Relation Age of Onset   Breast cancer Mother    Heart disease Mother    Prostate cancer Father    Colon cancer Father    Diabetes Sister    Heart disease Sister    Vitals:   06/21/23 1412  BP: 120/75  Pulse: 99  SpO2: 97%  Weight: [name])  334 lb (151.5 kg)  Height: 6\' 1"  (1.854 m)   Wt Readings from Last 3 Encounters:  06/21/23 (!) 334 lb (151.5 kg)  06/15/23 (!) 316 lb 5.8 oz (143.5 kg)  03/06/20 296 lb 2 oz (134.3 kg)   Lab Results  Component Value Date   CREATININE 1.32 (H) 06/16/2023   CREATININE 1.47 (H) 06/15/2023   CREATININE 1.65 (H) 06/13/2023   PHYSICAL EXAM: General:  Well appearing. No respiratory difficulty HEENT: normal Neck: supple. no JVD. No lymphadenopathy or thyromegaly appreciated. Cor: PMI nondisplaced. Regular rate & rhythm. No rubs, gallops or murmurs. Lungs: clear Abdomen: soft, nontender, nondistended. No hepatosplenomegaly. No bruits or masses.  Extremities: no cyanosis, clubbing, rash, trace pitting edema left lower leg Neuro: alert & oriented x 3, cranial nerves grossly intact. moves all 4 extremities w/o difficulty. Affect pleasant.  ECG: not done   ASSESSMENT & PLAN:  1: NICM with preserved ejection fraction- - suspect due to HTN/ untreated OSA - NYHA class II - euvolemic today - weighing daily and he was reminded to call for an overnight weight gain of >2 pounds or a weekly weight gain of >5 pounds - Echo 02/03/18: EF of 55-60% along with mild Ronald with normal PA pressure. - Echo 02/27/20: EF of 45-50% along with mild Ronald and mildly elevated PA pressure.   - Echo 06/14/23: EF of 65-70% along with acute PE and right ventricular systolic function is severely reduced. The right ventricular size is severely enlarged.  - not adding salt to  his food and has been reading food labels. Reviewed the importance of closely following a 2000mg  sodium diet  - saw cardiology Ronald Pratt) 09/23 - continue farxiga 10mg  daily - continue metoprolol succinate 25mg  daily - continue entresto 97/103mg  BID - continue torsemide 20mg  BID/ potassium daily - consider adding spironolactone at next visit and possibly stop potassium supplement - BMP today - BNP 06/13/23 was 801.7  2: HTN- - BP 120/75 - saw PCP (Ronald Pratt) 08/24 - BMP 06/16/23 reviewed and showed sodium 138, potassium 3.4, creatinine 1.32 and GFR >60 - BMP today  3: Tobacco use- - was smoking 1 ppd cigarettes but hasn't smoked since his admission 1 week ago - continued cessation discussed for 3 minutes with patient  4: Obstructive sleep apnea- - waiting to hear when his sleep study is scheduled for  5: PE/ DVT- - new diagnosis 08/24 - thrombectomy/thrombolysis done 06/14/23 - doppler on 06/14/23 positive for nonocclusive thrombus within the left popliteal vein.  - continue apixaban 10mg  BID for another 4 days and then decrease to 5mg  BID  Return in 1 month, sooner if needed.

## 2023-06-22 ENCOUNTER — Telehealth: Payer: Self-pay

## 2023-06-22 DIAGNOSIS — I5032 Chronic diastolic (congestive) heart failure: Secondary | ICD-10-CM

## 2023-06-22 LAB — PROTHROMBIN GENE MUTATION

## 2023-06-22 NOTE — Telephone Encounter (Addendum)
Spoke with pt regarding lab results.  Pt will come for lab work on September 9th. For repeat BMET.  Pt aware, agreeable, and verbalized understanding    ----- Message from [facility] ----- Potassium is normal. Kidney function is a little elevated which can be normal with the treatment you received in the hospital. Recheck BMET in 10 days to make sure it is improving

## 2023-06-30 ENCOUNTER — Ambulatory Visit: Payer: Managed Care, Other (non HMO) | Attending: Otolaryngology

## 2023-06-30 DIAGNOSIS — G478 Other sleep disorders: Secondary | ICD-10-CM | POA: Insufficient documentation

## 2023-06-30 DIAGNOSIS — R0683 Snoring: Secondary | ICD-10-CM | POA: Diagnosis present

## 2023-07-04 ENCOUNTER — Other Ambulatory Visit
Admission: RE | Admit: 2023-07-04 | Discharge: 2023-07-04 | Disposition: A | Discharge status: Home / Self Care | Payer: Managed Care, Other (non HMO) | Attending: Family | Admitting: Family

## 2023-07-04 DIAGNOSIS — I5032 Chronic diastolic (congestive) heart failure: Secondary | ICD-10-CM | POA: Insufficient documentation

## 2023-07-04 LAB — BASIC METABOLIC PANEL
Anion gap: 12 (ref 5–15)
BUN: 37 mg/dL — ABNORMAL HIGH (ref 6–20)
CO2: 26 mmol/L (ref 22–32)
Calcium: 9.3 mg/dL (ref 8.9–10.3)
Chloride: 97 mmol/L — ABNORMAL LOW (ref 98–111)
Creatinine, Ser: 1.59 mg/dL — ABNORMAL HIGH (ref 0.61–1.24)
GFR, Estimated: 54 mL/min — ABNORMAL LOW (ref >60)
Glucose, Bld: 137 mg/dL — ABNORMAL HIGH (ref 70–99)
Potassium: 3.4 mmol/L — ABNORMAL LOW (ref 3.5–5.1)
Sodium: 135 mmol/L (ref 135–145)

## 2023-07-13 ENCOUNTER — Telehealth: Payer: Self-pay

## 2023-07-13 ENCOUNTER — Other Ambulatory Visit: Payer: Self-pay

## 2023-07-13 MED ORDER — APIXABAN 5 MG PO TABS: 5.0000 mg | ORAL_TABLET | Freq: Two times a day (BID) | ORAL | 11 refills | Status: AC

## 2023-07-13 NOTE — Progress Notes (Signed)
Pt called stating he needs a new rx for his Eliquis. Pt also states with his insurance, the medication is expensive and he needs some sort of help. Spoke with Enos Fling pharmacist, he will call [facility] pharmacy w/ copay card info for cheaper expenses.

## 2023-07-21 ENCOUNTER — Telehealth: Payer: Self-pay | Admitting: Family

## 2023-07-21 ENCOUNTER — Encounter: Payer: Managed Care, Other (non HMO) | Admitting: Family

## 2023-07-21 NOTE — Telephone Encounter (Signed)
Patient did not show for his Heart Failure Clinic appointment on 07/21/23.

## 2023-07-21 NOTE — Progress Notes (Deleted)
Advanced Heart Failure Clinic Note   PCP: Ronald Reichmann, MD (last seen 08/24) Cardiologist: Ronald Peng, MD (last seen 09/23)  HPI:  [name] is a 47 y/o male with a history of HTN, gout, GERD, seizures, obstructive sleep apnea (not wearing CPAP), PE (08/24), DVT, DM, previous tobacco use and chronic heart failure.   Admitted 02/27/20 due to acute on chronic HF and syncope. Cardiology consult obtained. Initially given IV lasix with transition to oral diuretics. Brain MRI was negative. Discharged the following day.   Admitted 06/13/23 due to progressively worsening shortness of breath associated with lightheadedness and feeling like he is going to pass out when ambulating. CT angio chest showing large burden of PE originating in the bilateral main pulmonary arteries extending into the lobar segmental and subsegmental branches with right heart strain, RV/LV ratio of 1.7. Received catheter directed tPA on 8/20 and then started on a heparin infusion and given a dose of Lasix.   Echo 02/03/18: EF of 55-60% along with mild MR with normal PA pressure. Echo 02/27/20: EF of 45-50% along with mild MR and mildly elevated PA pressure.   Echo 06/14/23: EF of 65-70% along with acute PE and right ventricular systolic function is severely reduced. The right ventricular size is severely enlarged.   He presents for a HF follow-up visit with a chief complaint of    Has sleep study ordered but is waiting to hear from them about a date/ time.   Has not smoked since his recent admission. Is drinking 160-210 oz of fluids daily. Does work outside in the heat.      ROS: All systems negative except as listed in HPI, PMH and Problem List.  SH:  Social History   Socioeconomic History   Marital status: Divorced    Spouse name: Not on file   Number of children: Not on file   Years of education: Not on file   Highest education level: Not on file  Occupational History   Not on file  Tobacco Use    Smoking status: Every Day    Current packs/day: 1.00    Average packs/day: 1 pack/day for 20.0 years (20.0 ttl pk-yrs)    Types: Cigarettes   Smokeless tobacco: Never  Substance and Sexual Activity   Alcohol use: No    Comment: SOBER SINCE 2010   Drug use: No   Sexual activity: Yes    Birth control/protection: None  Other Topics Concern   Not on file  Social History Narrative   Not on file   Social Determinants of Health   Financial Resource Strain: Low Risk  (06/20/2023)   Received from [facility]   Overall Financial Resource Strain (CARDIA)    Difficulty of Paying Living Expenses: Not hard at all  Food Insecurity: No Food Insecurity (06/20/2023)   Received from [facility]   Hunger Vital Sign    Worried About Running Out of Food in the Last Year: Never true    Ran Out of Food in the Last Year: Never true  Transportation Needs: No Transportation Needs (06/20/2023)   Received from [facility] - Transportation    In the past 12 months, has lack of transportation kept you from medical appointments or from getting medications?: No    Lack of Transportation (Non-Medical): No  Physical Activity: Not on file  Stress: Not on file  Social Connections: Not on file  Intimate Partner Violence: Not At Risk (  06/14/2023)   Humiliation, Afraid, Rape, and Kick questionnaire    Fear of Current or Ex-Partner: No    Emotionally Abused: No    Physically Abused: No    Sexually Abused: No    FH:  Family History  Problem Relation Age of Onset   Breast cancer Mother    Heart disease Mother    Prostate cancer Father    Colon cancer Father    Diabetes Sister    Heart disease Sister     Past Medical History:  Diagnosis Date   CHF (congestive heart failure) (HCC)    Diverticulosis large intestine w/o perforation or abscess w/o bleeding 12/11/2016   Dysrhythmia    tacchycardia new..  put on metoprolol by dr. 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   Umbilical hernia     Current Outpatient Medications  Medication Sig Dispense Refill   allopurinol (ZYLOPRIM) 300 MG tablet Take 300 mg by mouth daily.      apixaban (ELIQUIS) 5 MG TABS tablet Take 1 tablet (5 mg total) by mouth 2 (two) times daily. 60 tablet 11   APIXABAN (ELIQUIS) VTE STARTER PACK (10MG  AND 5MG ) Take as directed on package: start with two-5mg  tablets twice daily for 7 days. On day 8, switch to one-5mg  tablet twice daily. 74 each 0   colchicine 0.6 MG tablet Take 0.6-1.2 mg by mouth See admin instructions. Take 2 tablets (1.2mg ) by mouth at onset of gout flare - take 1 additional tablet (0.6mg ) by mouth after an hour if needed     dapagliflozin propanediol (FARXIGA) 10 MG TABS tablet Take 1 tablet by mouth daily.     metoprolol succinate (TOPROL-XL) 25 MG 24 hr tablet Take 1 tablet (25 mg total) by mouth daily. 90 tablet 0   omeprazole (PRILOSEC OTC) 20 MG tablet Take 20 mg by mouth daily.     potassium chloride SA (K-DUR,KLOR-CON) 20 MEQ tablet Take 1 tablet (20 mEq total) by mouth daily. 90 tablet 3   sacubitril-valsartan (ENTRESTO) 97-103 MG Take 1 tablet by mouth 2 (two) times daily.     torsemide (DEMADEX) 10 MG tablet Take 40 mg by mouth daily. Taking 2 tablets in am and 2 tablets in pm     No current facility-administered medications for this visit.      PHYSICAL EXAM:  General:  Well appearing. No resp difficulty HEENT: normal Neck: supple. JVP flat. No lymphadenopathy or thryomegaly appreciated. Cor: PMI normal. Regular rate & rhythm. No rubs, gallops or murmurs. Lungs: clear Abdomen: soft, nontender, nondistended. No hepatosplenomegaly. No bruits or masses.  Extremities: no cyanosis, clubbing, rash, edema Neuro: alert & orientedx3, cranial nerves grossly intact. Moves  all 4 extremities w/o difficulty. Affect pleasant.   ECG:   ASSESSMENT & PLAN:  1: NICM with preserved ejection fraction- - suspect due to HTN/ untreated OSA - NYHA class II - euvolemic today - weighing daily and he was reminded to call for an overnight weight gain of >2 pounds or a weekly weight gain of >5 pounds - weight 334 pounds from last visit here 1 month ago - Echo 02/03/18: EF of 55-60% along with mild MR with normal PA pressure. - Echo 02/27/20: EF of 45-50% along with mild MR and mildly elevated PA pressure.   - Echo 06/14/23:  EF of 65-70% along with acute PE and right ventricular systolic function is severely reduced. The right ventricular size is severely enlarged.  - not adding salt to his food and has been reading food labels. Reviewed the importance of closely following a 2000mg  sodium diet  - saw cardiology Ronald Pratt) 09/23 - continue farxiga 10mg  daily - continue metoprolol succinate 25mg  daily - continue entresto 97/103mg  BID - continue torsemide 20mg  BID/ potassium daily - consider adding spironolactone at next visit and possibly stop potassium supplement - BMP today - BNP 06/13/23 was 801.7  2: HTN- - BP  - saw PCP (Ronald Pratt) 08/24 - BMP 07/04/23 reviewed and showed sodium 135, potassium 3.4, creatinine 1.59 and GFR 54   3: Tobacco use- - was smoking 1 ppd cigarettes but hasn't smoked since his admission 1 week ago - continued cessation discussed for 3 minutes with patient  4: Obstructive sleep apnea- - waiting to hear when his sleep study is scheduled for  5: PE/ DVT- - new diagnosis 08/24 - thrombectomy/thrombolysis done 06/14/23 - doppler on 06/14/23 positive for nonocclusive thrombus within the left popliteal vein.  - continue apixaban 5mg  BID

## 2023-09-14 ENCOUNTER — Encounter: Payer: Self-pay | Admitting: Family

## 2023-09-14 ENCOUNTER — Ambulatory Visit: Payer: Managed Care, Other (non HMO) | Attending: Family | Admitting: Family

## 2023-09-14 VITALS — BP 119/79 | HR 65 | Ht 73.0 in | Wt 337.0 lb

## 2023-09-14 DIAGNOSIS — Z79899 Other long term (current) drug therapy: Secondary | ICD-10-CM | POA: Diagnosis not present

## 2023-09-14 DIAGNOSIS — I5032 Chronic diastolic (congestive) heart failure: Secondary | ICD-10-CM

## 2023-09-14 DIAGNOSIS — I2782 Chronic pulmonary embolism: Secondary | ICD-10-CM

## 2023-09-14 DIAGNOSIS — I428 Other cardiomyopathies: Secondary | ICD-10-CM | POA: Diagnosis present

## 2023-09-14 DIAGNOSIS — I110 Hypertensive heart disease with heart failure: Secondary | ICD-10-CM | POA: Insufficient documentation

## 2023-09-14 DIAGNOSIS — I2699 Other pulmonary embolism without acute cor pulmonale: Secondary | ICD-10-CM | POA: Insufficient documentation

## 2023-09-14 DIAGNOSIS — I10 Essential (primary) hypertension: Secondary | ICD-10-CM | POA: Diagnosis not present

## 2023-09-14 DIAGNOSIS — G4733 Obstructive sleep apnea (adult) (pediatric): Secondary | ICD-10-CM | POA: Insufficient documentation

## 2023-09-14 DIAGNOSIS — M109 Gout, unspecified: Secondary | ICD-10-CM | POA: Insufficient documentation

## 2023-09-14 DIAGNOSIS — Z7901 Long term (current) use of anticoagulants: Secondary | ICD-10-CM | POA: Insufficient documentation

## 2023-09-14 DIAGNOSIS — E119 Type 2 diabetes mellitus without complications: Secondary | ICD-10-CM | POA: Diagnosis not present

## 2023-09-14 DIAGNOSIS — K219 Gastro-esophageal reflux disease without esophagitis: Secondary | ICD-10-CM | POA: Insufficient documentation

## 2023-09-14 DIAGNOSIS — I5022 Chronic systolic (congestive) heart failure: Secondary | ICD-10-CM

## 2023-09-14 DIAGNOSIS — I509 Heart failure, unspecified: Secondary | ICD-10-CM | POA: Insufficient documentation

## 2023-09-14 DIAGNOSIS — I2602 Saddle embolus of pulmonary artery with acute cor pulmonale: Secondary | ICD-10-CM

## 2023-09-14 DIAGNOSIS — F17200 Nicotine dependence, unspecified, uncomplicated: Secondary | ICD-10-CM

## 2023-09-14 MED ORDER — POTASSIUM CHLORIDE CRYS ER 20 MEQ PO TBCR: 60.0000 meq | EXTENDED_RELEASE_TABLET | Freq: Every day | ORAL | 3 refills | Status: DC

## 2023-09-14 MED ORDER — TORSEMIDE 20 MG PO TABS: 60.0000 mg | ORAL_TABLET | Freq: Two times a day (BID) | ORAL | 3 refills | Status: DC

## 2023-09-14 NOTE — Progress Notes (Signed)
ReDS Vest / Clip - 09/14/23 0900       ReDS Vest / Clip   Station Marker D    Ruler Value 39    ReDS Value Range Moderate volume overload    ReDS Actual Value 40

## 2023-09-14 NOTE — Patient Instructions (Addendum)
INCREASE YOUR POTASSIUM TO 60 MG (3 TABLETS DAILY)  INCREASE YOUR TORSEMIDE TO 60 MG TWICE DAILY (3 TABLETS TWICE DAILY - 6 TOTAL)  Try freezing your water bottle so that you sip instead of drinking so much fluids. You should try to limit your fluid intake to 60-64 ounces daily. Try chewing gum or sucking on different candy to increase your saliva production.

## 2023-09-14 NOTE — Progress Notes (Signed)
Advanced Heart Failure Clinic Note    PCP: Barbette Reichmann, MD (last seen 11/24) Cardiologist: Dorothyann Peng, MD (last seen 09/23)  HPI:  Ronald Pratt is a 47 y/o male with a history of HTN, gout, GERD, seizures, obstructive sleep apnea (wearing Bipap), PE (08/24), DVT, DM, previous tobacco use and chronic heart failure.   Admitted 02/27/20 due to acute on chronic HF and syncope. Cardiology consult obtained. Initially given IV lasix with transition to oral diuretics. Brain MRI was negative. Discharged the following day.   Admitted 06/13/23 due to progressively worsening shortness of breath associated with lightheadedness and feeling like he is going to pass out when ambulating. CT angio chest showing large burden of PE originating in the bilateral main pulmonary arteries extending into the lobar segmental and subsegmental branches with right heart strain, RV/LV ratio of 1.7. Received catheter directed tPA on 8/20 and then started on a heparin infusion and given a dose of Lasix.   Echo 02/03/18: EF of 55-60% along with mild Ronald with normal PA pressure. Echo 02/27/20: EF of 45-50% along with mild Ronald and mildly elevated PA pressure.   Echo 06/14/23: EF of 65-70% along with acute PE and right ventricular systolic function is severely reduced. The right ventricular size is severely enlarged.   He presents for a HF clinic follow-up visit with a chief complaint of minimal shortness of breath with moderate exertion. Chronic in nature. Has associated abdominal bloating along with this. Denies fatigue, chest pain, cough, palpitations, wheezing, pedal edema, dizziness or difficulty sleeping. Does note that he gained 4 pounds overnight. Had his metolazone stopped yesterday due to renal impairment.    Wearing bipap 3-4 nights/ week for 5 hours each time.   No tobacco since 06/13/23. Continues to drink too much fluids and he is aware of this but says that his mouth "stays so dry". Estimates that he's drinking  140+ ounces of fluid daily. Not adding salt to his foods.    Past Medical History:  Diagnosis Date   CHF (congestive heart failure) (HCC)    Diverticulosis large intestine w/o perforation or abscess w/o bleeding 12/11/2016   Dysrhythmia    tacchycardia new..  put on metoprolol by dr. 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   Umbilical hernia     Current Outpatient Medications  Medication Sig Dispense Refill   allopurinol (ZYLOPRIM) 300 MG tablet Take 300 mg by mouth daily.      apixaban (ELIQUIS) 5 MG TABS tablet Take 1 tablet (5 mg total) by mouth 2 (two) times daily. 60 tablet 11   APIXABAN (ELIQUIS) VTE STARTER PACK (10MG  AND 5MG ) Take as directed on package: start with two-5mg  tablets twice daily for 7 days. On day 8, switch to one-5mg  tablet twice daily. 74 each 0   colchicine 0.6 MG tablet Take 0.6-1.2 mg by mouth See admin instructions. Take 2 tablets (1.2mg ) by mouth at onset of gout flare - take 1 additional tablet (0.6mg ) by mouth after an hour if needed     dapagliflozin propanediol (FARXIGA) 10 MG TABS tablet Take 1 tablet by mouth daily.     metoprolol succinate (TOPROL-XL) 25 MG 24 hr tablet Take 1 tablet (25 mg total) by mouth daily. 90 tablet 0   omeprazole (PRILOSEC OTC) 20 MG tablet Take 20 mg  by mouth daily.     potassium chloride SA (K-DUR,KLOR-CON) 20 MEQ tablet Take 1 tablet (20 mEq total) by mouth daily. 90 tablet 3   sacubitril-valsartan (ENTRESTO) 97-103 MG Take 1 tablet by mouth 2 (two) times daily.     torsemide (DEMADEX) 10 MG tablet Take 40 mg by mouth daily. Taking 2 tablets in am and 2 tablets in pm     No current facility-administered medications for this visit.    No Known Allergies    Social History   Socioeconomic History   Marital status: Divorced    Spouse name: Not on file   Number  of children: Not on file   Years of education: Not on file   Highest education level: Not on file  Occupational History   Not on file  Tobacco Use   Smoking status: Every Day    Current packs/day: 1.00    Average packs/day: 1 pack/day for 20.0 years (20.0 ttl pk-yrs)    Types: Cigarettes   Smokeless tobacco: Never  Substance and Sexual Activity   Alcohol use: No    Comment: SOBER SINCE 2010   Drug use: No   Sexual activity: Yes    Birth control/protection: None  Other Topics Concern   Not on file  Social History Narrative   Not on file   Social Determinants of Health   Financial Resource Strain: Low Risk  (09/12/2023)   Received from [facility]   Overall Financial Resource Strain (CARDIA)    Difficulty of Paying Living Expenses: Not hard at all  Food Insecurity: No Food Insecurity (09/12/2023)   Received from [facility]   Hunger Vital Sign    Worried About Running Out of Food in the Last Year: Never true    Ran Out of Food in the Last Year: Never true  Transportation Needs: No Transportation Needs (09/12/2023)   Received from [facility] - Transportation    In the past 12 months, has lack of transportation kept you from medical appointments or from getting medications?: No    Lack of Transportation (Non-Medical): No  Physical Activity: Not on file  Stress: Not on file  Social Connections: Not on file  Intimate Partner Violence: Not At Risk (06/14/2023)   Humiliation, Afraid, Rape, and Kick questionnaire    Fear of Current or Ex-Partner: No    Emotionally Abused: No    Physically Abused: No    Sexually Abused: No      Family History  Problem Relation Age of Onset   Breast cancer Mother    Heart disease Mother    Prostate cancer Father    Colon cancer Father    Diabetes Sister    Heart disease Sister    Vitals:   09/14/23 0846  BP: 119/79  Pulse: 65  SpO2: 97%  Weight: (!) 337 lb (152.9 kg)   Height: 6\' 1"  (1.854 m)   Wt Readings from Last 3 Encounters:  09/14/23 (!) 337 lb (152.9 kg)  06/21/23 (!) 334 lb (151.5 kg)  06/15/23 (!) 316 lb 5.8 oz (143.5 kg)   Lab Results  Component Value Date   CREATININE 1.59 (H) 07/04/2023   CREATININE 1.60 (H) 06/21/2023   CREATININE 1.32 (H) 06/16/2023    PHYSICAL EXAM: General:  Well appearing. No respiratory difficulty HEENT: normal Neck: supple. no JVD. No lymphadenopathy or thyromegaly appreciated. Cor: PMI nondisplaced. Regular rate & rhythm. No rubs, gallops or murmurs. Lungs: clear Abdomen:  soft, nontender, nondistended. No hepatosplenomegaly. No bruits or masses.  Extremities: no cyanosis, clubbing, rash, edema Neuro: alert & oriented x 3, cranial nerves grossly intact. moves all 4 extremities w/o difficulty. Affect pleasant.  ECG: not done  ReDs: 40%   ASSESSMENT & PLAN:  1: NICM with preserved ejection fraction- - suspect due to HTN/ OSA - NYHA class II - mildly fluid overloaded with slight weight gain, symptoms and elevated ReDs reading - weighing daily & gained 4 pounds overnight; reminded to call for an overnight weight gain of >2 pounds or a weekly weight gain of >5 pounds - weight up 3 pounds from last visit here 3 months ago - Echo 02/03/18: EF of 55-60% along with mild Ronald with normal PA pressure. - Echo 02/27/20: EF of 45-50% along with mild Ronald and mildly elevated PA pressure.   - Echo 06/14/23: EF of 65-70% along with acute PE and right ventricular systolic function is severely reduced. The right ventricular size is severely enlarged.  - ReDs 40% - not adding salt to his food and has been reading food labels. Reviewed the importance of closely following a 2000mg  sodium diet  - saw cardiology Ronald Pratt) 09/23 - continue farxiga 10mg  daily - continue metoprolol succinate 25mg  daily - continue entresto 97/103mg  BID - increase potassium to daily - increase torsemide to 60mg  BID - consider adding  spironolactone and possibly stop potassium supplement - reviewed extensively the importance of decreasing fluid intake to closer to 60-64 oz (currently drinking double this amount); can freeze a water bottle so that he can sip instead of guzzle to decrease consumption - try chewing gum or sucking on different candy - renal function could be worsening due to increased fluid intake/ volume - will check BMET/ proBNP in 2 days - could consider using furoscix on the weekend as he works less hours on the weekend (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) - BNP 07/08/77 was 801.7  2: HTN- - BP 119/79 - saw PCP (Ronald Pratt) 11/24 - BMP 09/12/23 reviewed and showed sodium 139, potassium 3.9, creatinine 2.0 and GFR 41 - BMET in 2 days  3: Tobacco use- - continues to not smoke since his admission 08/24 - congratulated him on that and encouraged continued cessation  4: Obstructive sleep apnea- - wearing bipap 3-4 nights/ week for ~ 5 hours each time - reviewed the importance of wearing it nightly to help his health but also because insurance will take it back if he doesn't wear it enough  5: PE/ DVT- - diagnosed 08/24 - thrombectomy/thrombolysis done 06/14/23 - doppler on 06/14/23 positive for nonocclusive thrombus within the left popliteal vein.  - continue apixaban 5mg  BID  Return in 2 days, sooner if needed.

## 2023-09-16 ENCOUNTER — Encounter: Payer: Self-pay | Admitting: Family

## 2023-09-16 ENCOUNTER — Ambulatory Visit: Payer: Managed Care, Other (non HMO) | Attending: Family | Admitting: Family

## 2023-09-16 VITALS — BP 114/66 | HR 69 | Wt 330.0 lb

## 2023-09-16 DIAGNOSIS — Z79899 Other long term (current) drug therapy: Secondary | ICD-10-CM | POA: Insufficient documentation

## 2023-09-16 DIAGNOSIS — Z7984 Long term (current) use of oral hypoglycemic drugs: Secondary | ICD-10-CM | POA: Insufficient documentation

## 2023-09-16 DIAGNOSIS — Z7901 Long term (current) use of anticoagulants: Secondary | ICD-10-CM | POA: Diagnosis not present

## 2023-09-16 DIAGNOSIS — I2782 Chronic pulmonary embolism: Secondary | ICD-10-CM

## 2023-09-16 DIAGNOSIS — Z86718 Personal history of other venous thrombosis and embolism: Secondary | ICD-10-CM | POA: Insufficient documentation

## 2023-09-16 DIAGNOSIS — K219 Gastro-esophageal reflux disease without esophagitis: Secondary | ICD-10-CM | POA: Diagnosis not present

## 2023-09-16 DIAGNOSIS — I428 Other cardiomyopathies: Secondary | ICD-10-CM | POA: Diagnosis not present

## 2023-09-16 DIAGNOSIS — G4733 Obstructive sleep apnea (adult) (pediatric): Secondary | ICD-10-CM | POA: Insufficient documentation

## 2023-09-16 DIAGNOSIS — I110 Hypertensive heart disease with heart failure: Secondary | ICD-10-CM | POA: Insufficient documentation

## 2023-09-16 DIAGNOSIS — I5032 Chronic diastolic (congestive) heart failure: Secondary | ICD-10-CM | POA: Insufficient documentation

## 2023-09-16 DIAGNOSIS — Z86711 Personal history of pulmonary embolism: Secondary | ICD-10-CM | POA: Insufficient documentation

## 2023-09-16 DIAGNOSIS — I10 Essential (primary) hypertension: Secondary | ICD-10-CM | POA: Diagnosis not present

## 2023-09-16 DIAGNOSIS — E119 Type 2 diabetes mellitus without complications: Secondary | ICD-10-CM | POA: Diagnosis not present

## 2023-09-16 DIAGNOSIS — F17200 Nicotine dependence, unspecified, uncomplicated: Secondary | ICD-10-CM | POA: Diagnosis not present

## 2023-09-16 DIAGNOSIS — R5383 Other fatigue: Secondary | ICD-10-CM | POA: Insufficient documentation

## 2023-09-16 DIAGNOSIS — F17210 Nicotine dependence, cigarettes, uncomplicated: Secondary | ICD-10-CM | POA: Diagnosis not present

## 2023-09-16 DIAGNOSIS — I2602 Saddle embolus of pulmonary artery with acute cor pulmonale: Secondary | ICD-10-CM

## 2023-09-16 MED ORDER — METOPROLOL SUCCINATE ER 25 MG PO TB24: 25.0000 mg | ORAL_TABLET | Freq: Every day | ORAL | 3 refills | Status: DC

## 2023-09-16 NOTE — Patient Instructions (Signed)
Go DOWN to LOWER LEVEL (LL) to have your blood work completed inside of [employer] office.  We will only call you if the results are abnormal or if the provider would like to make medication changes.

## 2023-09-16 NOTE — Progress Notes (Signed)
Advanced Heart Failure Clinic Note    PCP: Barbette Reichmann, MD (last seen 11/24) Cardiologist: Dorothyann Peng, MD (last seen 09/23)  HPI:  Mr Toal is a 47 y/o male with a history of HTN, gout, GERD, seizures, obstructive sleep apnea (wearing Bipap), PE (08/24), DVT, DM, previous tobacco use and chronic heart failure.   Admitted 02/27/20 due to acute on chronic HF and syncope. Cardiology consult obtained. Initially given IV lasix with transition to oral diuretics. Brain MRI was negative. Discharged the following day.   Admitted 06/13/23 due to progressively worsening shortness of breath associated with lightheadedness and feeling like he is going to pass out when ambulating. CT angio chest showing large burden of PE originating in the bilateral main pulmonary arteries extending into the lobar segmental and subsegmental branches with right heart strain, RV/LV ratio of 1.7. Received catheter directed tPA on 8/20 and then started on a heparin infusion and given a dose of Lasix.   Echo 02/03/18: EF of 55-60% along with mild MR with normal PA pressure. Echo 02/27/20: EF of 45-50% along with mild MR and mildly elevated PA pressure.   Echo 06/14/23: EF of 65-70% along with acute PE and right ventricular systolic function is severely reduced. The right ventricular size is severely enlarged.   He presents for a HF clinic follow-up visit with a chief complaint of minimal fatigue with moderate exertion. Has associated right wrist tenderness which he thinks is his gout. Denies shortness of breath, chest pain, cough, palpitations, abdominal distention, pedal edema, dizziness, difficulty sleeping or weight gain. Does not have his metoprolol bottle with him and is unsure if he has it at home.   At last visit potassium was increased to daily along with increasing torsemide to 60mg  BID and reports feeling better.   Wearing bipap 3-4 nights/ week for 5 hours each time.   No tobacco since 06/13/23. Has  decreased his fluid intake by freezing water bottles and chewing gum.     Past Medical History:  Diagnosis Date   CHF (congestive heart failure) (HCC)    Diverticulosis large intestine w/o perforation or abscess w/o bleeding 12/11/2016   Dysrhythmia    tacchycardia new..  put on metoprolol by dr. 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   Umbilical hernia     Current Outpatient Medications  Medication Sig Dispense Refill   allopurinol (ZYLOPRIM) 300 MG tablet Take 300 mg by mouth daily.      apixaban (ELIQUIS) 5 MG TABS tablet Take 1 tablet (5 mg total) by mouth 2 (two) times daily. 60 tablet 11   colchicine 0.6 MG tablet Take 0.6-1.2 mg by mouth See admin instructions. Take 2 tablets (1.2mg ) by mouth at onset of gout flare - take 1 additional tablet (0.6mg ) by mouth after an hour if needed     dapagliflozin propanediol (FARXIGA) 10 MG TABS tablet Take 1 tablet by mouth daily.     metoprolol succinate (TOPROL-XL) 25 MG 24 hr tablet Take 1 tablet (25 mg total) by mouth daily. 90 tablet 0   omeprazole (PRILOSEC OTC) 20 MG tablet Take 20 mg by mouth daily.     potassium chloride SA (KLOR-CON M) 20 MEQ tablet Take 3 tablets (60 mEq total) by mouth daily. 90 tablet 3   sacubitril-valsartan (ENTRESTO) 97-103 MG  Take 1 tablet by mouth 2 (two) times daily.     torsemide (DEMADEX) 20 MG tablet Take 3 tablets (60 mg total) by mouth 2 (two) times daily. 180 tablet 3   No current facility-administered medications for this visit.    No Known Allergies    Social History   Socioeconomic History   Marital status: Divorced    Spouse name: Not on file   Number of children: Not on file   Years of education: Not on file   Highest education level: Not on file  Occupational History   Not on file  Tobacco Use   Smoking status: Every Day     Current packs/day: 1.00    Average packs/day: 1 pack/day for 20.0 years (20.0 ttl pk-yrs)    Types: Cigarettes   Smokeless tobacco: Never  Substance and Sexual Activity   Alcohol use: No    Comment: SOBER SINCE 2010   Drug use: No   Sexual activity: Yes    Birth control/protection: None  Other Topics Concern   Not on file  Social History Narrative   Not on file   Social Determinants of Health   Financial Resource Strain: Low Risk  (09/12/2023)   Received [facility]   Overall Financial Resource Strain (CARDIA)    Difficulty of Paying Living Expenses: Not hard at all  Food Insecurity: No Food Insecurity (09/12/2023)   Received from [facility]   Hunger Vital Sign    Worried About Running Out of Food in the Last Year: Never true    Ran Out of Food in the Last Year: Never true  Transportation Needs: No Transportation Needs (09/12/2023)   Received from [facility] - Transportation    In the past 12 months, has lack of transportation kept you from medical appointments or from getting medications?: No    Lack of Transportation (Non-Medical): No  Physical Activity: Not on file  Stress: Not on file  Social Connections: Not on file  Intimate Partner Violence: Not At Risk (06/14/2023)   Humiliation, Afraid, Rape, and Kick questionnaire    Fear of Current or Ex-Partner: No    Emotionally Abused: No    Physically Abused: No    Sexually Abused: No      Family History  Problem Relation Age of Onset   Breast cancer Mother    Heart disease Mother    Prostate cancer Father    Colon cancer Father    Diabetes Sister    Heart disease Sister    Vitals:   09/16/23 0838 09/16/23 0847  BP: 114/66   Pulse: 100 69  SpO2: 97% 100%  Weight: (!) 330 lb (149.7 kg)    Wt Readings from Last 3 Encounters:  09/16/23 (!) 330 lb (149.7 kg)  09/14/23 (!) 337 lb (152.9 kg)  06/21/23 (!) 334 lb (151.5 kg)   Lab Results   Component Value Date   CREATININE 1.59 (H) 07/04/2023   CREATININE 1.60 (H) 06/21/2023   CREATININE 1.32 (H) 06/16/2023    PHYSICAL EXAM: General:  Well appearing. No respiratory difficulty HEENT: normal Neck: supple. no JVD. No lymphadenopathy or thyromegaly appreciated. Cor: PMI nondisplaced. Regular rate & rhythm. No rubs, gallops or murmurs. Lungs: clear Abdomen: soft, nontender, nondistended. No hepatosplenomegaly. No bruits or masses.  Extremities: no cyanosis, clubbing, rash, edema Neuro: alert & oriented x 3, cranial nerves grossly intact. moves all 4 extremities w/o difficulty. Affect pleasant.  ECG: not  done  ReDs: 41%   ASSESSMENT & PLAN:  1: NICM with preserved ejection fraction- - suspect due to HTN/ OSA - NYHA class II - euvolemic  - weighing daily; reminded to call for an overnight weight gain of >2 pounds or a weekly weight gain of >5 pounds - weight down 7 pounds from last visit here 2 days ago - Echo 02/03/18: EF of 55-60% along with mild MR with normal PA pressure. - Echo 02/27/20: EF of 45-50% along with mild MR and mildly elevated PA pressure.   - Echo 06/14/23: EF of 65-70% along with acute PE and right ventricular systolic function is severely reduced. The right ventricular size is severely enlarged.  - ReDs 41%; days ago was 40% (this could be his normal reading) - not adding salt to his food and has been reading food labels. Reviewed the importance of closely following a 2000mg  sodium diet  - saw cardiology Cliffton Asters) 09/23 - continue farxiga 10mg  daily - continue metoprolol succinate 25mg  daily (refilled this today) - continue entresto 97/103mg  BID - continue potassium daily - continue torsemide 60mg  BID - will check BMET/ proBNP today - consider adding spironolactone and possibly stop/ decrease potassium supplement after getting lab results back - drinking less fluids as he's frozen his water so is now sipping instead of guzzling; also using  chewing gum - BNP 06/13/23 was 801.7  2: HTN- - BP 114/66 - saw PCP (Hande) 11/24 - BMP 09/12/23 reviewed and showed sodium 139, potassium 3.9, creatinine 2.0 and GFR 41 - BMET today  3: Tobacco use- - continues to not smoke since his admission 08/24 - congratulated him on that and encouraged continued cessation  4: Obstructive sleep apnea- - wearing bipap 3-4 nights/ week for ~ 5 hours each time - reviewed the importance of wearing it nightly to help his health but also because insurance will take it back if he doesn't wear it enough  5: PE/ DVT- - diagnosed 08/24 - thrombectomy/thrombolysis done 06/14/23 - doppler on 06/14/23 positive for nonocclusive thrombus within the left popliteal vein.  - continue apixaban 5mg  BID   Return in 1 month, sooner if needed.

## 2023-09-17 LAB — BASIC METABOLIC PANEL
BUN/Creatinine Ratio: 21 — ABNORMAL HIGH (ref 9–20)
BUN: 30 mg/dL — ABNORMAL HIGH (ref 6–24)
CO2: 21 mmol/L (ref 20–29)
Calcium: 9.8 mg/dL (ref 8.7–10.2)
Chloride: 101 mmol/L (ref 96–106)
Creatinine, Ser: 1.45 mg/dL — ABNORMAL HIGH (ref 0.76–1.27)
Glucose: 103 mg/dL — ABNORMAL HIGH (ref 70–99)
Potassium: 4.4 mmol/L (ref 3.5–5.2)
Sodium: 141 mmol/L (ref 134–144)
eGFR: 60 mL/min/{1.73_m2} (ref >59)

## 2023-09-17 LAB — PRO B NATRIURETIC PEPTIDE: NT-Pro BNP: <36 pg/mL (ref 0–121)

## 2023-09-19 ENCOUNTER — Telehealth: Payer: Self-pay

## 2023-09-19 DIAGNOSIS — I5032 Chronic diastolic (congestive) heart failure: Secondary | ICD-10-CM

## 2023-09-19 MED ORDER — SPIRONOLACTONE 25 MG PO TABS: 12.5000 mg | ORAL_TABLET | Freq: Every day | ORAL | 3 refills | Status: DC

## 2023-09-19 NOTE — Telephone Encounter (Signed)
-----   Message from Delma Freeze sent at 09/19/2023 10:32 AM EST ----- Kidney function looks better. Begin spironolactone 25mg  as 1/2 tablet every day. When you start this, decrease your potassium tablets to 1 daily for now. Recheck BMET in 1 week after starting spironolactone.

## 2023-10-13 NOTE — Progress Notes (Deleted)
Advanced Heart Failure Clinic Note    PCP: Barbette Reichmann, MD (last seen 11/24) Cardiologist: Dorothyann Peng, MD (last seen 09/23)  Chief Complaint:  HPI:  Ronald Pratt is a 47 y/o male with a history of HTN, gout, GERD, seizures, obstructive sleep apnea (wearing Bipap), PE (08/24), DVT, DM, previous tobacco use and chronic heart failure.   Admitted 02/27/20 due to acute on chronic HF and syncope. Cardiology consult obtained. Initially given IV lasix with transition to oral diuretics. Brain MRI was negative. Discharged the following day.   Admitted 06/13/23 due to progressively worsening shortness of breath associated with lightheadedness and feeling like he is going to pass out when ambulating. CT angio chest showing large burden of PE originating in the bilateral main pulmonary arteries extending into the lobar segmental and subsegmental branches with right heart strain, RV/LV ratio of 1.7. Received catheter directed tPA on 8/20 and then started on a heparin infusion and given a dose of Lasix.   Echo 02/03/18: EF of 55-60% along with mild Ronald with normal PA pressure. Echo 02/27/20: EF of 45-50% along with mild Ronald and mildly elevated PA pressure.   Echo 06/14/23: EF of 65-70% along with acute PE and right ventricular systolic function is severely reduced. The right ventricular size is severely enlarged.   He presents for a HF clinic follow-up visit with a chief complaint of    At last visit, spironolactone 12.5mg  daily was started and potassium was decreased to daily. He did not get f/u lab work done.   Wearing bipap 3-4 nights/ week for 5 hours each time.   No tobacco since 06/13/23. Has decreased his fluid intake by freezing water bottles and chewing gum.    Past Medical History:  Diagnosis Date   CHF (congestive heart failure) (HCC)    Diverticulosis large intestine w/o perforation or abscess w/o bleeding 12/11/2016   Dysrhythmia    tacchycardia new..  put on metoprolol by dr.  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   Umbilical hernia     Current Outpatient Medications  Medication Sig Dispense Refill   allopurinol (ZYLOPRIM) 300 MG tablet Take 300 mg by mouth daily.      apixaban (ELIQUIS) 5 MG TABS tablet Take 1 tablet (5 mg total) by mouth 2 (two) times daily. 60 tablet 11   colchicine 0.6 MG tablet Take 0.6-1.2 mg by mouth See admin instructions. Take 2 tablets (1.2mg ) by mouth at onset of gout flare - take 1 additional tablet (0.6mg ) by mouth after an hour if needed     dapagliflozin propanediol (FARXIGA) 10 MG TABS tablet Take 1 tablet by mouth daily.     metoprolol succinate (TOPROL-XL) 25 MG 24 hr tablet Take 1 tablet (25 mg total) by mouth daily. 90 tablet 3   omeprazole (PRILOSEC OTC) 20 MG tablet Take 20 mg by mouth daily.     potassium chloride SA (KLOR-CON M) 20 MEQ tablet Take 3 tablets (60 mEq total) by mouth daily. 90 tablet 3   sacubitril-valsartan (ENTRESTO) 97-103 MG Take 1 tablet by mouth 2 (two) times daily.     spironolactone (ALDACTONE) 25 MG tablet Take 0.5 tablets (12.5 mg total) by mouth daily. 45 tablet 3   torsemide (DEMADEX) 20 MG tablet Take 3 tablets (60 mg total) by mouth  2 (two) times daily. 180 tablet 3   No current facility-administered medications for this visit.    No Known Allergies    Social History   Socioeconomic History   Marital status: Divorced    Spouse name: Not on file   Number of children: Not on file   Years of education: Not on file   Highest education level: Not on file  Occupational History   Not on file  Tobacco Use   Smoking status: Every Day    Current packs/day: 1.00    Average packs/day: 1 pack/day for 20.0 years (20.0 ttl pk-yrs)    Types: Cigarettes   Smokeless tobacco: Never  Substance and Sexual Activity   Alcohol use: No     Comment: SOBER SINCE 2010   Drug use: No   Sexual activity: Yes    Birth control/protection: None  Other Topics Concern   Not on file  Social History Narrative   Not on file   Social Drivers of Health   Financial Resource Strain: Low Risk  (09/12/2023)   Received from [facility]   Overall Financial Resource Strain (CARDIA)    Difficulty of Paying Living Expenses: Not hard at all  Food Insecurity: No Food Insecurity (09/12/2023)   Received from [facility]   Hunger Vital Sign    Worried About Running Out of Food in the Last Year: Never true    Ran Out of Food in the Last Year: Never true  Transportation Needs: No Transportation Needs (09/12/2023)   Received from [facility] - Transportation    In the past 12 months, has lack of transportation kept you from medical appointments or from getting medications?: No    Lack of Transportation (Non-Medical): No  Physical Activity: Not on file  Stress: Not on file  Social Connections: Not on file  Intimate Partner Violence: Not At Risk (06/14/2023)   Humiliation, Afraid, Rape, and Kick questionnaire    Fear of Current or Ex-Partner: No    Emotionally Abused: No    Physically Abused: No    Sexually Abused: No      Family History  Problem Relation Age of Onset   Breast cancer Mother    Heart disease Mother    Prostate cancer Father    Colon cancer Father    Diabetes Sister    Heart disease Sister       PHYSICAL EXAM: General:  Well appearing. No respiratory difficulty HEENT: normal Neck: supple. no JVD. No lymphadenopathy or thyromegaly appreciated. Cor: PMI nondisplaced. Regular rate & rhythm. No rubs, gallops or murmurs. Lungs: clear Abdomen: soft, nontender, nondistended. No hepatosplenomegaly. No bruits or masses.  Extremities: no cyanosis, clubbing, rash, edema Neuro: alert & oriented x 3, cranial nerves grossly intact. moves all 4 extremities w/o  difficulty. Affect pleasant.  ECG: not done     ASSESSMENT & PLAN:  1: NICM with preserved ejection fraction- - suspect due to HTN/ OSA - NYHA class II - euvolemic  - weighing daily; reminded to call for an overnight weight gain of >2 pounds or a weekly weight gain of >5 pounds - weight 330 pounds from last visit here 1 month ago - Echo 02/03/18: EF of 55-60% along with mild Ronald with normal PA pressure. - Echo 02/27/20: EF of 45-50% along with mild Ronald and mildly elevated PA pressure.   - Echo 06/14/23: EF of 65-70% along with acute PE and right ventricular systolic function is  severely reduced. The right ventricular size is severely enlarged.  - not adding salt to his food and has been reading food labels. Reviewed the importance of closely following a 2000mg  sodium diet  - saw cardiology Cliffton Asters) 09/23 - continue farxiga 10mg  daily - continue metoprolol succinate 25mg  daily  - continue entresto 97/103mg  BID - continue potassium daily - continue torsemide 60mg  BID - continue spironolactone 12.5mg  daily - BMET today - BNP 06/13/23 was 801.7  2: HTN- - BP  - saw PCP (Hande) 11/24 - BMP 09/16/23 reviewed and showed sodium 141, potassium 4.4, creatinine 1.45 and GFR 60   3: Tobacco use- - continues to not smoke since his admission 08/24 - congratulated him on that and encouraged continued cessation  4: Obstructive sleep apnea- - wearing bipap 3-4 nights/ week for ~ 5 hours each time - reviewed the importance of wearing it nightly to help his health but also because insurance will take it back if he doesn't wear it enough  5: PE/ DVT- - diagnosed 08/24 - thrombectomy/thrombolysis done 06/14/23 - doppler on 06/14/23 positive for nonocclusive thrombus within the left popliteal vein.  - continue apixaban 5mg  BID

## 2023-10-14 ENCOUNTER — Telehealth: Payer: Self-pay | Admitting: Family

## 2023-10-14 ENCOUNTER — Encounter: Payer: Managed Care, Other (non HMO) | Admitting: Family

## 2023-10-14 NOTE — Telephone Encounter (Signed)
Pt confirmed appt for 10/17/23

## 2023-10-17 ENCOUNTER — Encounter: Payer: Self-pay | Admitting: Family

## 2023-10-17 ENCOUNTER — Ambulatory Visit (HOSPITAL_BASED_OUTPATIENT_CLINIC_OR_DEPARTMENT_OTHER): Payer: Managed Care, Other (non HMO) | Admitting: Family

## 2023-10-17 ENCOUNTER — Other Ambulatory Visit
Admission: RE | Admit: 2023-10-17 | Discharge: 2023-10-17 | Disposition: A | Discharge status: Home / Self Care | Payer: Managed Care, Other (non HMO) | Source: Ambulatory Visit | Attending: Family | Admitting: Family

## 2023-10-17 VITALS — BP 108/66 | HR 74 | Wt 334.0 lb

## 2023-10-17 DIAGNOSIS — I10 Essential (primary) hypertension: Secondary | ICD-10-CM | POA: Diagnosis not present

## 2023-10-17 DIAGNOSIS — I5032 Chronic diastolic (congestive) heart failure: Secondary | ICD-10-CM | POA: Insufficient documentation

## 2023-10-17 DIAGNOSIS — G4733 Obstructive sleep apnea (adult) (pediatric): Secondary | ICD-10-CM | POA: Diagnosis not present

## 2023-10-17 DIAGNOSIS — I2602 Saddle embolus of pulmonary artery with acute cor pulmonale: Secondary | ICD-10-CM

## 2023-10-17 DIAGNOSIS — F17200 Nicotine dependence, unspecified, uncomplicated: Secondary | ICD-10-CM

## 2023-10-17 DIAGNOSIS — I2782 Chronic pulmonary embolism: Secondary | ICD-10-CM

## 2023-10-17 LAB — BASIC METABOLIC PANEL
Anion gap: 9 (ref 5–15)
BUN: 42 mg/dL — ABNORMAL HIGH (ref 6–20)
CO2: 26 mmol/L (ref 22–32)
Calcium: 9.2 mg/dL (ref 8.9–10.3)
Chloride: 104 mmol/L (ref 98–111)
Creatinine, Ser: 1.46 mg/dL — ABNORMAL HIGH (ref 0.61–1.24)
GFR, Estimated: 59 mL/min — ABNORMAL LOW (ref >60)
Glucose, Bld: 96 mg/dL (ref 70–99)
Potassium: 3.9 mmol/L (ref 3.5–5.1)
Sodium: 139 mmol/L (ref 135–145)

## 2023-10-17 NOTE — Progress Notes (Signed)
Advanced Heart Failure Clinic Note    PCP: Barbette Reichmann, MD (last seen 11/24) Cardiologist: Dorothyann Peng, MD (last seen 09/23)  Chief Complaint: Fatigue  HPI:  [name] is a 47 y/o male with a history of HTN, gout, GERD, seizures, obstructive sleep apnea (wearing Bipap), PE (08/24), DVT, DM, previous tobacco use and chronic heart failure.   Admitted 02/27/20 due to acute on chronic HF and syncope. Cardiology consult obtained. Initially given IV lasix with transition to oral diuretics. Brain MRI was negative. Discharged the following day.   Admitted 06/13/23 due to progressively worsening shortness of breath associated with lightheadedness and feeling like he is going to pass out when ambulating. CT angio chest showing large burden of PE originating in the bilateral main pulmonary arteries extending into the lobar segmental and subsegmental branches with right heart strain, RV/LV ratio of 1.7. Received catheter directed tPA on 8/20 and then started on a heparin infusion and given a dose of Lasix.   Echo 02/03/18: EF of 55-60% along with mild MR with normal PA pressure. Echo 02/27/20: EF of 45-50% along with mild MR and mildly elevated PA pressure.   Echo 06/14/23: EF of 65-70% along with acute PE and right ventricular systolic function is severely reduced. The right ventricular size is severely enlarged.   He presents for a HF clinic follow-up visit with a chief complaint of minimal fatigue with moderate exertion. Chronic in nature. Has no other complaints and specifically denies shortness of breath, chest pain, cough, palpitations, abdominal distention, pedal edema, dizziness or difficulty sleeping.   At last visit, spironolactone 12.5mg  daily was started and potassium was decreased to daily. No issues with medication changes that he's aware of.   Wearing bipap 3-4 nights/ week for 5 hours each time.   No tobacco since 06/13/23. Has decreased his fluid intake by freezing water  bottles and chewing gum.    Past Medical History:  Diagnosis Date   CHF (congestive heart failure) (HCC)    Diverticulosis large intestine w/o perforation or abscess w/o bleeding 12/11/2016   Dysrhythmia    tacchycardia new..  put on metoprolol by dr. 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   Umbilical hernia     Current Outpatient Medications  Medication Sig Dispense Refill   allopurinol (ZYLOPRIM) 300 MG tablet Take 300 mg by mouth daily.      apixaban (ELIQUIS) 5 MG TABS tablet Take 1 tablet (5 mg total) by mouth 2 (two) times daily. 60 tablet 11   colchicine 0.6 MG tablet Take 0.6-1.2 mg by mouth See admin instructions. Take 2 tablets (1.2mg ) by mouth at onset of gout flare - take 1 additional tablet (0.6mg ) by mouth after an hour if needed     dapagliflozin propanediol (FARXIGA) 10 MG TABS tablet Take 1 tablet by mouth daily.     metoprolol succinate (TOPROL-XL) 25 MG 24 hr tablet Take 1 tablet (25 mg total) by mouth daily. 90 tablet 3   omeprazole (PRILOSEC OTC) 20 MG tablet Take 20 mg by mouth daily.     potassium chloride SA (KLOR-CON M) 20 MEQ tablet Take 3 tablets (60 mEq total) by mouth daily. 90 tablet 3   sacubitril-valsartan (ENTRESTO) 97-103 MG Take 1 tablet by mouth 2 (two) times daily.     spironolactone (  ALDACTONE) 25 MG tablet Take 0.5 tablets (12.5 mg total) by mouth daily. 45 tablet 3   torsemide (DEMADEX) 20 MG tablet Take 3 tablets (60 mg total) by mouth 2 (two) times daily. 180 tablet 3   No current facility-administered medications for this visit.    No Known Allergies    Social History   Socioeconomic History   Marital status: Divorced    Spouse name: Not on file   Number of children: Not on file   Years of education: Not on file   Highest education level: Not on file  Occupational History    Not on file  Tobacco Use   Smoking status: Every Day    Current packs/day: 1.00    Average packs/day: 1 pack/day for 20.0 years (20.0 ttl pk-yrs)    Types: Cigarettes   Smokeless tobacco: Never  Substance and Sexual Activity   Alcohol use: No    Comment: SOBER SINCE 2010   Drug use: No   Sexual activity: Yes    Birth control/protection: None  Other Topics Concern   Not on file  Social History Narrative   Not on file   Social Drivers of Health   Financial Resource Strain: Low Risk  (09/12/2023)   Received from [facility]   Overall Financial Resource Strain (CARDIA)    Difficulty of Paying Living Expenses: Not hard at all  Food Insecurity: No Food Insecurity (09/12/2023)   Received from [facility]   Hunger Vital Sign    Worried About Running Out of Food in the Last Year: Never true    Ran Out of Food in the Last Year: Never true  Transportation Needs: No Transportation Needs (09/12/2023)   Received from [facility] - Transportation    In the past 12 months, has lack of transportation kept you from medical appointments or from getting medications?: No    Lack of Transportation (Non-Medical): No  Physical Activity: Not on file  Stress: Not on file  Social Connections: Not on file  Intimate Partner Violence: Not At Risk (06/14/2023)   Humiliation, Afraid, Rape, and Kick questionnaire    Fear of Current or Ex-Partner: No    Emotionally Abused: No    Physically Abused: No    Sexually Abused: No      Family History  Problem Relation Age of Onset   Breast cancer Mother    Heart disease Mother    Prostate cancer Father    Colon cancer Father    Diabetes Sister    Heart disease Sister    Vitals:   10/17/23 1346  BP: 108/66  Pulse: 74  SpO2: 95%  Weight: (!) 334 lb (151.5 kg)   Wt Readings from Last 3 Encounters:  10/17/23 (!) 334 lb (151.5 kg)  09/16/23 (!) 330 lb (149.7 kg)  09/14/23 (!) 337 lb  (152.9 kg)   Lab Results  Component Value Date   CREATININE 1.46 (H) 10/17/2023   CREATININE 1.45 (H) 09/16/2023   CREATININE 1.59 (H) 07/04/2023    PHYSICAL EXAM: General:  Well appearing. No respiratory difficulty HEENT: normal Neck: supple. no JVD. No lymphadenopathy or thyromegaly appreciated. Cor: PMI nondisplaced. Regular rate & rhythm. No rubs, gallops or murmurs. Lungs: clear Abdomen: soft, nontender, nondistended. No hepatosplenomegaly. No bruits or masses.  Extremities: no cyanosis, clubbing, rash, edema Neuro: alert & oriented x 3, cranial nerves grossly intact. moves all 4 extremities w/o difficulty. Affect pleasant.   ECG: not  done   ASSESSMENT & PLAN:  1: NICM with preserved ejection fraction- - suspect due to HTN/ OSA - NYHA class II - euvolemic  - weighing daily; reminded to call for an overnight weight gain of >2 pounds or a weekly weight gain of >5 pounds - weight up 4 pounds from last visit here 1 month ago - Echo 02/03/18: EF of 55-60% along with mild MR with normal PA pressure. - Echo 02/27/20: EF of 45-50% along with mild MR and mildly elevated PA pressure.   - Echo 06/14/23: EF of 65-70% along with acute PE and right ventricular systolic function is severely reduced. The right ventricular size is severely enlarged.  - not adding salt to his food and has been reading food labels. Reviewed the importance of closely following a 2000mg  sodium diet  - saw cardiology Cliffton Asters) 09/23 - continue farxiga 10mg  daily - continue metoprolol succinate 25mg  daily  - continue entresto 97/103mg  BID - continue potassium daily - continue torsemide 60mg  BID - continue spironolactone 12.5mg  daily - BMET today & then consider increasing spironolactone, stopping potassium and decreasing torsemide if possible after lab results obtained - BNP 06/13/23 was 801.7  2: HTN- - BP 108/66 - saw PCP (Hande) 11/24 - BMP 09/16/23 reviewed and showed sodium 141, potassium 4.4,  creatinine 1.45 and GFR 60 - BMET today  3: Tobacco use- - continues to not smoke since his admission 08/24 - congratulated him on that and encouraged continued cessation  4: Obstructive sleep apnea- - wearing bipap 3-4 nights/ week for ~ 5 hours each time - reviewed the importance of wearing it nightly to help his health but also because insurance will take it back if he doesn't wear it enough  5: PE/ DVT- - diagnosed 08/24 - thrombectomy/thrombolysis done 06/14/23 - doppler on 06/14/23 positive for nonocclusive thrombus within the left popliteal vein.  - continue apixaban 5mg  BID   Return in 1 month, sooner if needed

## 2023-10-17 NOTE — Patient Instructions (Signed)
 Go over to the [facility]. Go pass the gift shop and have your blood work completed.  We will only call you if the results are abnormal or if the provider would like to make medication changes.

## 2023-10-18 NOTE — Telephone Encounter (Signed)
-----   Message from Delma Freeze sent at 10/17/2023  2:58 PM EST ----- Please place BMET order for next week and update med list with changes via mychart message I sent. Thank you!

## 2023-11-02 ENCOUNTER — Telehealth: Payer: Self-pay

## 2023-11-02 MED ORDER — SPIRONOLACTONE 25 MG PO TABS: 25.0000 mg | ORAL_TABLET | Freq: Every day | ORAL | 3 refills | Status: DC

## 2023-11-02 MED ORDER — TORSEMIDE 20 MG PO TABS: 60.0000 mg | ORAL_TABLET | Freq: Every day | ORAL | 3 refills | Status: DC

## 2023-11-02 NOTE — Telephone Encounter (Signed)
 Pt aware, agreeable, and verbalized understanding  Med list updated   [name],    Your potassium level is normal and your kidney function is stable so here are the following changes I'd like you to make:    - increase spironolactone  to 25mg  once daily (one whole tablet)  - decrease torsemide  to 3 tablets (60mg )  in the morning but if you need an afternoon dose because of weight gain or swelling, take an additional 20-40mg  if you need it  - STOP potassium completely   Ellouise Class, NP

## 2023-11-17 NOTE — Progress Notes (Signed)
Advanced Heart Failure Clinic Note    PCP: Barbette Reichmann, MD (last seen 11/24) Cardiologist: Dorothyann Peng, MD (last seen 09/23)  Chief Complaint: fatigue  HPI:  [name] is a 48 y/o male with a history of HTN, gout, GERD, seizures, obstructive sleep apnea (wearing Bipap), PE (08/24), DVT, DM, previous tobacco use and chronic heart failure.   Admitted 02/27/20 due to acute on chronic HF and syncope. Cardiology consult obtained. Initially given IV lasix with transition to oral diuretics. Brain MRI was negative. Discharged the following day.   Admitted 06/13/23 due to progressively worsening shortness of breath associated with lightheadedness and feeling like he is going to pass out when ambulating. CT angio chest showing large burden of PE originating in the bilateral main pulmonary arteries extending into the lobar segmental and subsegmental branches with right heart strain, RV/LV ratio of 1.7. Received catheter directed tPA on 8/20 and then started on a heparin infusion and given a dose of Lasix.   Echo 02/03/18: EF of 55-60% along with mild MR with normal PA pressure. Echo 02/27/20: EF of 45-50% along with mild MR and mildly elevated PA pressure.   Echo 06/14/23: EF of 65-70% along with acute PE and right ventricular systolic function is severely reduced. The right ventricular size is severely enlarged.   He presents for a HF clinic follow-up visit with a chief complaint of minimal fatigue with moderate exertion. He has no other symptoms and specifically denies shortness of breath, chest pain, cough, palpitations, abdominal distention, pedal edema, dizziness or difficulty sleeping.  Wearing bipap 3-4 nights/ week for 5 hours each time. He does say that he struggles with wearing it sometimes.    At last visit spironolactone was increased to 25mg  daily, potassium supplements stopped and torsemide decreased to 60mg  daily with additional 20-40mg  PRN in the PM. He did take an additional 20mg   torsemide earlier today because he was feeling bloated.   No tobacco since 06/13/23. Has decreased his fluid intake by freezing water bottles and chewing gum.     Past Medical History:  Diagnosis Date   CHF (congestive heart failure) (HCC)    Diverticulosis large intestine w/o perforation or abscess w/o bleeding 12/11/2016   Dysrhythmia    tacchycardia new..  put on metoprolol by dr. 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   Umbilical hernia     Current Outpatient Medications  Medication Sig Dispense Refill   allopurinol (ZYLOPRIM) 300 MG tablet Take 300 mg by mouth daily.      apixaban (ELIQUIS) 5 MG TABS tablet Take 1 tablet (5 mg total) by mouth 2 (two) times daily. 60 tablet 11   colchicine 0.6 MG tablet Take 0.6-1.2 mg by mouth See admin instructions. Take 2 tablets (1.2mg ) by mouth at onset of gout flare - take 1 additional tablet (0.6mg ) by mouth after an hour if needed     metoprolol succinate (TOPROL-XL) 25 MG 24 hr tablet Take 1 tablet (25 mg total) by mouth daily. 90 tablet 3   omeprazole (PRILOSEC OTC) 20 MG tablet Take 20 mg by mouth daily.     sacubitril-valsartan (ENTRESTO) 97-103 MG Take 1 tablet by mouth 2 (two) times daily.     spironolactone (ALDACTONE) 25 MG tablet Take 1 tablet (25 mg total) by mouth daily. 90 tablet  3   torsemide (DEMADEX) 20 MG tablet Take 3 tablets (60 mg total) by mouth daily. Can take an extra 20mg  ( 1 tab) in the evening if needed for weight gain of 3 lbs or swelling 180 tablet 3   No current facility-administered medications for this visit.    No Known Allergies    Social History   Socioeconomic History   Marital status: Divorced    Spouse name: Not on file   Number of children: Not on file   Years of education: Not on file   Highest education level: Not on file   Occupational History   Not on file  Tobacco Use   Smoking status: Every Day    Current packs/day: 1.00    Average packs/day: 1 pack/day for 20.0 years (20.0 ttl pk-yrs)    Types: Cigarettes   Smokeless tobacco: Never  Substance and Sexual Activity   Alcohol use: No    Comment: SOBER SINCE 2010   Drug use: No   Sexual activity: Yes    Birth control/protection: None  Other Topics Concern   Not on file  Social History Narrative   Not on file   Social Drivers of Health   Financial Resource Strain: Low Risk  (09/12/2023)   Received from [facility]   Overall Financial Resource Strain (CARDIA)    Difficulty of Paying Living Expenses: Not hard at all  Food Insecurity: No Food Insecurity (09/12/2023)   Received from [facility]   Hunger Vital Sign    Worried About Running Out of Food in the Last Year: Never true    Ran Out of Food in the Last Year: Never true  Transportation Needs: No Transportation Needs (09/12/2023)   Received from [facility] - Transportation    In the past 12 months, has lack of transportation kept you from medical appointments or from getting medications?: No    Lack of Transportation (Non-Medical): No  Physical Activity: Not on file  Stress: Not on file  Social Connections: Not on file  Intimate Partner Violence: Not At Risk (06/14/2023)   Humiliation, Afraid, Rape, and Kick questionnaire    Fear of Current or Ex-Partner: No    Emotionally Abused: No    Physically Abused: No    Sexually Abused: No      Family History  Problem Relation Age of Onset   Breast cancer Mother    Heart disease Mother    Prostate cancer Father    Colon cancer Father    Diabetes Sister    Heart disease Sister    Vitals:   11/21/23 1419  BP: 132/74  Pulse: 88  SpO2: 98%  Weight: (!) 337 lb 9.6 oz (153.1 kg)  Height: 6\' 1"  (1.854 m)   Wt Readings from Last 3 Encounters:  11/21/23 (!) 337 lb 9.6 oz  (153.1 kg)  10/17/23 (!) 334 lb (151.5 kg)  09/16/23 (!) 330 lb (149.7 kg)   Lab Results  Component Value Date   CREATININE 1.46 (H) 10/17/2023   CREATININE 1.45 (H) 09/16/2023   CREATININE 1.59 (H) 07/04/2023   PHYSICAL EXAM: General:  Well appearing. No respiratory difficulty HEENT: normal Neck: supple. no JVD. No lymphadenopathy or thyromegaly appreciated. Cor: PMI nondisplaced. Regular rate & rhythm. No rubs, gallops or murmurs. Lungs: clear Abdomen: soft, nontender, nondistended. No hepatosplenomegaly. No bruits or masses.  Extremities: no cyanosis, clubbing, rash, edema Neuro: alert & oriented x 3, cranial nerves grossly intact.  moves all 4 extremities w/o difficulty. Affect pleasant.  ECG: not done  ReDs reading: 41 %, abnormal (unchanged from previous reading. ? If this is his normal reading)   ASSESSMENT & PLAN:  1: NICM with preserved ejection fraction- - suspect due to HTN/ OSA - NYHA class II - euvolemic  - weighing daily; reminded to call for an overnight weight gain of >2 pounds or a weekly weight gain of >5 pounds - weight up 3 pounds from last visit here 1 month ago - ReDs 41% (previous reading 41%); may need to adjust diuretic but will get lab results back first - Echo 02/03/18: EF of 55-60% along with mild MR with normal PA pressure. - Echo 02/27/20: EF of 45-50% along with mild MR and mildly elevated PA pressure.   - Echo 06/14/23: EF of 65-70% along with acute PE and right ventricular systolic function is severely reduced. The right ventricular size is severely enlarged.  - not adding salt to his food and has been reading food labels. Reviewed the importance of closely following a 2000mg  sodium diet  - saw cardiology Cliffton Asters) 09/23; referral placed back to Dr. Glennis Brink office - continue farxiga 10mg  daily - continue metoprolol succinate 25mg  daily - continue entresto 97/103mg  BID - continue spironolactone 25mg  daily - continue torsemide 60mg  daily with  additional 20-40mg  PM PRN; he took additional 20mg  earlier today, may adjust daily diuretic but will get lab results back first - BMET/ pro-BNP today - drinking less fluids as he's frozen his water so is sipping instead of guzzling; also using chewing gum - BNP 06/13/23 was 801.7  2: HTN- - BP 132/74 - saw PCP (Hande) 11/24 - BMP 10/17/23 reviewed and showed sodium 139, potassium 3.9, creatinine 1.46 and GFR 59 - BMET today  3: Tobacco use- - continues to not smoke since his admission 08/24 - congratulated him on that and encouraged continued cessation  4: Obstructive sleep apnea- - wearing bipap 3-4 nights/ week for ~ 5 hours each time - reviewed the importance of wearing it nightly to help his health but also because insurance will take it back if he doesn't wear it enough - pulmonology referral placed today to see if settings need to be adjusted - last sleep study done 06/30/23  5: PE/ DVT- - diagnosed 08/24 - thrombectomy/thrombolysis done 06/14/23 - doppler on 06/14/23 positive for nonocclusive thrombus within the left popliteal vein.  - continue apixaban 5mg  BID   Return in 2 months, sooner if needed. Will contact patient once lab results obtained to adjust daily diuretic a little.

## 2023-11-21 ENCOUNTER — Ambulatory Visit: Payer: Managed Care, Other (non HMO) | Attending: Family | Admitting: Family

## 2023-11-21 ENCOUNTER — Encounter: Payer: Self-pay | Admitting: Family

## 2023-11-21 VITALS — BP 132/74 | HR 88 | Ht 73.0 in | Wt 337.6 lb

## 2023-11-21 DIAGNOSIS — I2782 Chronic pulmonary embolism: Secondary | ICD-10-CM

## 2023-11-21 DIAGNOSIS — M109 Gout, unspecified: Secondary | ICD-10-CM | POA: Diagnosis present

## 2023-11-21 DIAGNOSIS — Z87891 Personal history of nicotine dependence: Secondary | ICD-10-CM | POA: Insufficient documentation

## 2023-11-21 DIAGNOSIS — E119 Type 2 diabetes mellitus without complications: Secondary | ICD-10-CM | POA: Insufficient documentation

## 2023-11-21 DIAGNOSIS — Z86718 Personal history of other venous thrombosis and embolism: Secondary | ICD-10-CM | POA: Insufficient documentation

## 2023-11-21 DIAGNOSIS — I5032 Chronic diastolic (congestive) heart failure: Secondary | ICD-10-CM | POA: Insufficient documentation

## 2023-11-21 DIAGNOSIS — K219 Gastro-esophageal reflux disease without esophagitis: Secondary | ICD-10-CM | POA: Diagnosis present

## 2023-11-21 DIAGNOSIS — Z86711 Personal history of pulmonary embolism: Secondary | ICD-10-CM | POA: Diagnosis not present

## 2023-11-21 DIAGNOSIS — I10 Essential (primary) hypertension: Secondary | ICD-10-CM

## 2023-11-21 DIAGNOSIS — I428 Other cardiomyopathies: Secondary | ICD-10-CM | POA: Insufficient documentation

## 2023-11-21 DIAGNOSIS — G4733 Obstructive sleep apnea (adult) (pediatric): Secondary | ICD-10-CM | POA: Diagnosis not present

## 2023-11-21 DIAGNOSIS — I2602 Saddle embolus of pulmonary artery with acute cor pulmonale: Secondary | ICD-10-CM

## 2023-11-21 DIAGNOSIS — I110 Hypertensive heart disease with heart failure: Secondary | ICD-10-CM | POA: Insufficient documentation

## 2023-11-21 DIAGNOSIS — F17200 Nicotine dependence, unspecified, uncomplicated: Secondary | ICD-10-CM | POA: Diagnosis not present

## 2023-11-21 NOTE — Progress Notes (Signed)
ReDS Vest / Clip - 11/21/23 1419       ReDS Vest / Clip   Station Marker D    Ruler Value 34    ReDS Value Range High volume overload    ReDS Actual Value 41

## 2023-11-21 NOTE — Addendum Note (Signed)
Addended by: Jola Schmidt A on: 11/21/2023 04:45 PM   Modules accepted: Orders

## 2023-11-21 NOTE — Patient Instructions (Addendum)
Medication Changes:  No Changes In Medications at this time.   Lab Work:  Go DOWN to LOWER LEVEL (LL) to have your blood work completed inside of [employer] office.  We will only call you if the results are abnormal or if the provider would like to make medication changes.  Referrals:  REFERRAL SENT BACK OVER TO DR. CALLWOOD'S OFFICE- THEY SHOULD CALL- IF YOU DONT HEAR BACK PLEASE CALL [phone]  YOU HAVE BEEN REFERRED TO pulmonology here in [place]  THEY WILL REACH OUT TO YOU OR CALL TO ARRANGE THIS. PLEASE CALL us WITH ANY CONCERNS   Follow-Up in: 2 MONTHS AS SCHEDULED   If you have any questions or concerns before your next appointment please send [place] a message through mychart or call our office at [phone] Monday-Friday 8 am-5 pm.   If you have an urgent need after hours on the weekend please call your Primary Cardiologist or the Advanced Heart Failure Clinic in [place] at [phone].   At the Advanced Heart Failure Clinic, you and your health needs are our priority. We have a designated team specialized in the treatment of Heart Failure. This Care Team includes your primary Heart Failure Specialized Cardiologist (physician), Advanced Practice Providers (APPs- Physician Assistants and Nurse Practitioners), and Pharmacist who all work together to provide you with the care you need, when you need it.   You may see any of the following providers on your designated Care Team at your next follow up:  Dr. Arvilla Meres Dr. Marca Ancona Dr. Dorthula Nettles Dr. Theresia Bough Tonye Becket, NP Robbie Lis, [place] [address] [place], [place] Brynda Peon, NP [place] Lee, NP Clarisa Kindred, NP Enos Fling, PharmD

## 2023-11-22 LAB — BASIC METABOLIC PANEL
BUN/Creatinine Ratio: 16 (ref 9–20)
BUN: 24 mg/dL (ref 6–24)
CO2: 23 mmol/L (ref 20–29)
Calcium: 9.6 mg/dL (ref 8.7–10.2)
Chloride: 101 mmol/L (ref 96–106)
Creatinine, Ser: 1.52 mg/dL — ABNORMAL HIGH (ref 0.76–1.27)
Glucose: 112 mg/dL — ABNORMAL HIGH (ref 70–99)
Potassium: 4.4 mmol/L (ref 3.5–5.2)
Sodium: 140 mmol/L (ref 134–144)
eGFR: 57 mL/min/{1.73_m2} — ABNORMAL LOW (ref >59)

## 2023-11-22 LAB — PRO B NATRIURETIC PEPTIDE: NT-Pro BNP: <36 pg/mL (ref 0–121)

## 2023-11-24 ENCOUNTER — Telehealth: Payer: Self-pay

## 2023-11-24 NOTE — Telephone Encounter (Signed)
Spoke to patient. He stated that he is not currently wearing cpap.

## 2023-11-25 ENCOUNTER — Encounter: Payer: Self-pay | Admitting: Sleep Medicine

## 2023-11-25 ENCOUNTER — Ambulatory Visit: Payer: Managed Care, Other (non HMO) | Admitting: Sleep Medicine

## 2023-11-25 VITALS — BP 128/74 | HR 101 | Temp 97.3°F | Resp 18 | Ht 73.0 in | Wt 337.8 lb

## 2023-11-25 DIAGNOSIS — I5030 Unspecified diastolic (congestive) heart failure: Secondary | ICD-10-CM | POA: Diagnosis not present

## 2023-11-25 DIAGNOSIS — G4733 Obstructive sleep apnea (adult) (pediatric): Secondary | ICD-10-CM | POA: Diagnosis not present

## 2023-11-25 NOTE — Progress Notes (Signed)
Name:Ronald Pratt MRN: [id number] DOB: 1976-10-08   CHIEF COMPLAINT:  Establish care for OSA   HISTORY OF PRESENT ILLNESS:  Ronald Pratt is a 40 yoaam w/ a h/o OSA, CHF, PE, GERD and morbid obesity who presents to establish care for severe OSA. Patient was initially diagnosed with severe OSA (AHI 33, O2 nadir 78%) in 2019. States that he did not get set up with CPAP therapy for unknown reasons. Patient underwent titration study September 2024 and BIPAP therapy set to 18/12 cm H2O was recommended.  Denies loud snoring or witnessed apnea. Reports excessive daytime sleepiness when sedentary. Reports restless sleep. Reports significant weight fluctuations due to CHF. Denies morning headaches, RLS symptoms or dream enactment.  Denies family history of sleep apnea. Reports occasional drowsy driving. Drinks 1 cup of coffee daily, denies alcohol or illicit drug use. Patient is a former smoker, states that he quit on 06/13/23.  Bedtime 12 am Sleep onset 15 mins Several awakenings due to unclear reasons Rise time 3:30 am   EPWORTH SLEEP SCORE 16   PAST MEDICAL HISTORY :   has a past medical history of CHF (congestive heart failure) (HCC), Diverticulosis large intestine w/o perforation or abscess w/o bleeding (12/11/2016), Dysrhythmia, GERD (gastroesophageal reflux disease), Gout, Hypertension, Obstructive sleep apnea, Seizures (HCC), and Umbilical hernia.  has a past surgical history that includes Esophagogastroduodenoscopy endoscopy (2014); Umbilical hernia repair (N/A, 01/04/2017); Insertion of mesh (N/A, 01/04/2017); and PULMONARY THROMBECTOMY (Bilateral, 06/14/2023). Prior to Admission medications   Medication Sig Start Date End Date Taking? Authorizing Provider  allopurinol (ZYLOPRIM) 300 MG tablet Take 300 mg by mouth daily.    Yes [provider]  apixaban (ELIQUIS) 5 MG TABS tablet Take 1 tablet (5 mg total) by mouth 2 (two) times daily. 07/13/23  Yes Ronald Pratt   colchicine 0.6 MG tablet Take 0.6-1.2 mg by mouth See admin instructions. Take 2 tablets (1.2mg ) by mouth at onset of gout flare - take 1 additional tablet (0.6mg ) by mouth after an hour if needed   Yes [provider]  metoprolol succinate (TOPROL-XL) 25 MG 24 hr tablet Take 1 tablet (25 mg total) by mouth daily. 09/16/23  Yes Ronald Pratt  omeprazole (PRILOSEC OTC) 20 MG tablet Take 20 mg by mouth daily.   Yes [provider]  sacubitril-valsartan (ENTRESTO) 97-103 MG Take 1 tablet by mouth 2 (two) times daily. 11/04/22  Yes [provider]  spironolactone (ALDACTONE) 25 MG tablet Take 1 tablet (25 mg total) by mouth daily. 11/02/23 01/31/24 Yes Ronald Pratt  torsemide (DEMADEX) 20 MG tablet Take 3 tablets (60 mg total) by mouth daily. Can take an extra 20mg  ( 1 tab) in the evening if needed for weight gain of 3 lbs or swelling 11/02/23  Yes Ronald Pratt   No Known Allergies  FAMILY HISTORY:  family history includes Breast cancer in his mother; Colon cancer in his father; Diabetes in his sister; Heart disease in his mother and sister; Prostate cancer in his father. SOCIAL HISTORY:  reports that he has been smoking cigarettes. He has a 20 pack-year smoking history. He has never used smokeless tobacco. He reports that he does not drink alcohol and does not use drugs.   Review of Systems:  Gen:  Denies  fever, sweats, chills weight loss  HEENT: Denies blurred vision, double vision, ear pain, eye pain, hearing loss, nose bleeds, sore throat Cardiac:  No dizziness, chest pain  or heaviness, chest tightness,edema, No JVD Resp:   No cough, -sputum production, -shortness of breath,-wheezing, -hemoptysis,  Gi: Denies swallowing difficulty, stomach pain, nausea or vomiting, diarrhea, constipation, bowel incontinence Gu:  Denies bladder incontinence, burning urine Ext:   Denies Joint pain, stiffness or swelling Skin: Denies  skin rash, easy bruising or  bleeding or hives Endoc:  Denies polyuria, polydipsia , polyphagia or weight change Psych:   Denies depression, insomnia or hallucinations  Other:  All other systems negative   VITAL SIGNS: BP 128/74   Pulse (!) 101   Temp (!) 97.3 F (36.3 C) (Temporal)   Resp 18   Ht 6\' 1"  (1.854 m)   Wt (!) 337 lb 12.8 oz (153.2 kg)   SpO2 95%   BMI 44.57 kg/m    Physical Examination:   General Appearance: No distress  EYES PERRLA, EOM intact.   NECK Supple, No JVD Pulmonary: normal breath sounds, No wheezing.  CardiovascularNormal S1,S2.  No m/r/g.   Abdomen: Benign, Soft, non-tender. Skin:   warm, no rashes, no ecchymosis  Extremities: normal, no cyanosis, clubbing. Neuro:without focal findings,  speech normal  PSYCHIATRIC: Mood, affect within normal limits.   ASSESSMENT AND PLAN SYNOPSIS  Patient with symptoms of snoring and excessive daytime sleepiness with probable underlying diagnosis of obstructive sleep apnea.   OSA Starting patient on stat auto BIPAP therapy set to max IPAP 25 cm H2O, min EPAP 4 cm H2O, pressure support 5 cm H2O. Reviewed sleep study results with patient. Discussed the consequences of untreated sleep apnea and the importance of using PAP therapy daily. Advised not to drive drowsy for safety of patient and others.   CHF Stable, on current management. Following with cardiology.   Morbid obesity Counseled patient on diet and lifestyle modification.    Patient satisfied with Plan of action and management. All questions answered  Follow up in 3 months to review BIPAP efficacy and compliance data.   I spent a total of 47 minutes reviewing chart data, face-to-face evaluation with the patient, counseling and coordination of care as detailed above.    Ronald Pratt, M.D.  Sleep Medicine [facility]

## 2023-11-25 NOTE — Progress Notes (Signed)
Epworth Sleepiness Scale  Use the following scale to choose the most appropriate number for each situation. 0 Would never nod off 1  Slight  chance of nodding off 2 Moderate chance of nodding off 3 High chance of nodding off  Sitting and reading: 0 Watching TV: 3 Sitting, inactive, in a public place (e.g., in a meeting, theater, or dinner event): 3 As a passenger in a car for an hour or more without stopping for a break: 3 Lying down to rest when circumstances permit:3 Sitting and talking to someone: 1 Sitting quietly after a meal without alcohol: 2 In a car, while stopped for a few  minutes in traffic or at a light: 1  TOTOAL: 16

## 2023-11-25 NOTE — Patient Instructions (Addendum)
Starting on BIPAP therapy. Follow up in 3 months.  Use BIPAP every night, minimum of 7-8 hours a night.  Change face mask cushion every 30 days or as directed by DME.  Wash your tubing with warm soap and water weekly, hang to dry. Wash humidifier portion weekly. Use distilled water and change daily   Be aware of reduced alertness and do not drive or operate heavy machinery if experiencing this or drowsiness.  Exercise encouraged, as tolerated. Encouraged proper weight management.  Important to get eight or more hours of sleep  Limiting the use of the computer and television before bedtime.  Decrease naps during the day, so night time sleep will become enhanced.  Limit caffeine, and sleep deprivation.  HTN, stroke, uncontrolled diabetes and heart failure are potential risk factors.  Risk of untreated sleep apnea including cardiac arrhthymias, stroke, DM, pulm HTN.

## 2024-01-19 ENCOUNTER — Telehealth: Payer: Self-pay | Admitting: Family

## 2024-01-19 NOTE — Progress Notes (Deleted)
 Advanced [facility] Clinic Note    PCP: Barbette Reichmann, MD (last seen 11/24) Cardiologist: Dorothyann Peng, MD (last seen 09/23)  Chief Complaint: fatigue  HPI:  [name] is a 48 y/o male with a history of HTN, gout, GERD, seizures, obstructive sleep apnea (wearing Bipap), PE (08/24), DVT, DM, previous tobacco use and chronic heart failure.   Admitted 02/27/20 due to acute on chronic HF and syncope. Cardiology consult obtained. Initially given IV lasix with transition to oral diuretics. Brain MRI was negative. Discharged the following day.   Admitted 06/13/23 due to progressively worsening shortness of breath associated with lightheadedness and feeling like he is going to pass out when ambulating. CT angio chest showing large burden of PE originating in the bilateral main pulmonary arteries extending into the lobar segmental and subsegmental branches with right heart strain, RV/LV ratio of 1.7. Received catheter directed tPA on 8/20 and then started on a heparin infusion and given a dose of Lasix.   Echo 02/03/18: EF of 55-60% along with mild MR with normal PA pressure. Echo 02/27/20: EF of 45-50% along with mild MR and mildly elevated PA pressure.   Echo 06/14/23: EF of 65-70% along with acute PE and right ventricular systolic function is severely reduced. The right ventricular size is severely enlarged.   He presents for a HF clinic follow-up visit with a chief complaint of minimal fatigue with moderate exertion. He has no other symptoms and specifically denies shortness of breath, chest pain, cough, palpitations, abdominal distention, pedal edema, dizziness or difficulty sleeping.  Wearing bipap 3-4 nights/ week for 5 hours each time. He does say that he struggles with wearing it sometimes.    At last visit spironolactone was increased to 25mg  daily, potassium supplements stopped and torsemide decreased to 60mg  daily with additional 20-40mg  PRN in the PM. He did take an additional 20mg   torsemide earlier today because he was feeling bloated.   No tobacco since 06/13/23. Has decreased his fluid intake by freezing water bottles and chewing gum.     Past Medical History:  Diagnosis Date   CHF (congestive heart failure) (HCC)    Diverticulosis large intestine w/o perforation or abscess w/o bleeding 12/11/2016   Dysrhythmia    tacchycardia new..  put on metoprolol by dr. 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   Umbilical hernia     Current Outpatient Medications  Medication Sig Dispense Refill   allopurinol (ZYLOPRIM) 300 MG tablet Take 300 mg by mouth daily.      apixaban (ELIQUIS) 5 MG TABS tablet Take 1 tablet (5 mg total) by mouth 2 (two) times daily. 60 tablet 11   colchicine 0.6 MG tablet Take 0.6-1.2 mg by mouth See admin instructions. Take 2 tablets (1.2mg ) by mouth at onset of gout flare - take 1 additional tablet (0.6mg ) by mouth after an hour if needed     metoprolol succinate (TOPROL-XL) 25 MG 24 hr tablet Take 1 tablet (25 mg total) by mouth daily. 90 tablet 3   omeprazole (PRILOSEC OTC) 20 MG tablet Take 20 mg by mouth daily.     sacubitril-valsartan (ENTRESTO) 97-103 MG Take 1 tablet by mouth 2 (two) times daily.     spironolactone (ALDACTONE) 25 MG tablet Take 1 tablet (25 mg total) by mouth daily. 90 tablet  3   torsemide (DEMADEX) 20 MG tablet Take 3 tablets (60 mg total) by mouth daily. Can take an extra 20mg  ( 1 tab) in the evening if needed for weight gain of 3 lbs or swelling 180 tablet 3   No current facility-administered medications for this visit.    No Known Allergies    Social History   Socioeconomic History   Marital status: Divorced    Spouse name: Not on file   Number of children: Not on file   Years of education: Not on file   Highest education level: Not on file   Occupational History   Not on file  Tobacco Use   Smoking status: Every Day    Current packs/day: 1.00    Average packs/day: 1 pack/day for 20.0 years (20.0 ttl pk-yrs)    Types: Cigarettes   Smokeless tobacco: Never  Substance and Sexual Activity   Alcohol use: No    Comment: SOBER SINCE 2010   Drug use: No   Sexual activity: Yes    Birth control/protection: None  Other Topics Concern   Not on file  Social History Narrative   Not on file   Social Drivers of Health   Financial Resource Strain: Low Risk  (12/01/2023)   Received from [facility]   Overall Financial Resource Strain (CARDIA)    Difficulty of Paying Living Expenses: Not hard at all  Food Insecurity: No Food Insecurity (12/01/2023)   Received from [facility]   Hunger Vital Sign    Worried About Running Out of Food in the Last Year: Never true    Ran Out of Food in the Last Year: Never true  Transportation Needs: No Transportation Needs (12/01/2023)   Received from [facility] - Transportation    In the past 12 months, has lack of transportation kept you from medical appointments or from getting medications?: No    Lack of Transportation (Non-Medical): No  Physical Activity: Not on file  Stress: Not on file  Social Connections: Not on file  Intimate Partner Violence: Not At Risk (06/14/2023)   Humiliation, Afraid, Rape, and Kick questionnaire    Fear of Current or Ex-Partner: No    Emotionally Abused: No    Physically Abused: No    Sexually Abused: No      Family History  Problem Relation Age of Onset   Breast cancer Mother    Heart disease Mother    Prostate cancer Father    Colon cancer Father    Diabetes Sister    Heart disease Sister    There were no vitals filed for this visit.  Wt Readings from Last 3 Encounters:  11/25/23 (!) 337 lb 12.8 oz (153.2 kg)  11/21/23 (!) 337 lb 9.6 oz (153.1 kg)  10/17/23 (!) 334 lb (151.5 kg)   Lab  Results  Component Value Date   CREATININE 1.52 (H) 11/21/2023   CREATININE 1.46 (H) 10/17/2023   CREATININE 1.45 (H) 09/16/2023   PHYSICAL EXAM: General:  Well appearing. No respiratory difficulty HEENT: normal Neck: supple. no JVD. No lymphadenopathy or thyromegaly appreciated. Cor: PMI nondisplaced. Regular rate & rhythm. No rubs, gallops or murmurs. Lungs: clear Abdomen: soft, nontender, nondistended. No hepatosplenomegaly. No bruits or masses.  Extremities: no cyanosis, clubbing, rash, edema Neuro: alert & oriented x 3, cranial nerves grossly intact. moves all 4 extremities w/o difficulty. Affect pleasant.  ECG: not done  ReDs reading: 41 %, abnormal (unchanged from  previous reading. ? If this is his normal reading)   ASSESSMENT & PLAN:  1: NICM with preserved ejection fraction- - suspect due to HTN/ OSA - NYHA class II - euvolemic  - weighing daily; reminded to call for an overnight weight gain of >2 pounds or a weekly weight gain of >5 pounds - weight up 3 pounds from last visit here 1 month ago - ReDs 41% (previous reading 41%); may need to adjust diuretic but will get lab results back first - Echo 02/03/18: EF of 55-60% along with mild MR with normal PA pressure. - Echo 02/27/20: EF of 45-50% along with mild MR and mildly elevated PA pressure.   - Echo 06/14/23: EF of 65-70% along with acute PE and right ventricular systolic function is severely reduced. The right ventricular size is severely enlarged.  - not adding salt to his food and has been reading food labels. Reviewed the importance of closely following a 2000mg  sodium diet  - saw cardiology Cliffton Asters) 09/23; referral placed back to Dr. Glennis Brink office - continue farxiga 10mg  daily - continue metoprolol succinate 25mg  daily - continue entresto 97/103mg  BID - continue spironolactone 25mg  daily - continue torsemide 60mg  daily with additional 20-40mg  PM PRN; he took additional 20mg  earlier today, may adjust daily  diuretic but will get lab results back first - BMET/ pro-BNP today - drinking less fluids as he's frozen his water so is sipping instead of guzzling; also using chewing gum - BNP 06/13/23 was 801.7  2: HTN- - BP 132/74 - saw PCP (Hande) 11/24 - BMP 10/17/23 reviewed and showed sodium 139, potassium 3.9, creatinine 1.46 and GFR 59 - BMET today  3: Tobacco use- - continues to not smoke since his admission 08/24 - congratulated him on that and encouraged continued cessation  4: Obstructive sleep apnea- - wearing bipap 3-4 nights/ week for ~ 5 hours each time - reviewed the importance of wearing it nightly to help his health but also because insurance will take it back if he doesn't wear it enough - pulmonology referral placed today to see if settings need to be adjusted - last sleep study done 06/30/23  5: PE/ DVT- - diagnosed 08/24 - thrombectomy/thrombolysis done 06/14/23 - doppler on 06/14/23 positive for nonocclusive thrombus within the left popliteal vein.  - continue apixaban 5mg  BID   Return in 2 months, sooner if needed. Will contact patient once lab results obtained to adjust daily diuretic a little.

## 2024-01-19 NOTE — Telephone Encounter (Incomplete)
 Called to confirm/remind patient of their appointment at the [facility] on 01/20/24***.   Appointment:   [x] Confirmed  [] Left mess   [] No answer/No voice mail  [] Phone not in service  Patient reminded to bring all medications and/or complete list.  Confirmed patient has transportation. Gave directions, instructed to utilize valet parking.

## 2024-01-20 ENCOUNTER — Ambulatory Visit: Attending: Family | Admitting: Family

## 2024-01-20 ENCOUNTER — Encounter: Payer: Managed Care, Other (non HMO) | Admitting: Family

## 2024-01-20 ENCOUNTER — Encounter: Payer: Self-pay | Admitting: Family

## 2024-01-20 ENCOUNTER — Encounter: Payer: Managed Care, Other (non HMO) | Admitting: Cardiology

## 2024-01-20 VITALS — BP 102/80 | HR 70 | Wt 327.0 lb

## 2024-01-20 DIAGNOSIS — Z87891 Personal history of nicotine dependence: Secondary | ICD-10-CM | POA: Diagnosis not present

## 2024-01-20 DIAGNOSIS — M109 Gout, unspecified: Secondary | ICD-10-CM | POA: Insufficient documentation

## 2024-01-20 DIAGNOSIS — F17200 Nicotine dependence, unspecified, uncomplicated: Secondary | ICD-10-CM

## 2024-01-20 DIAGNOSIS — I10 Essential (primary) hypertension: Secondary | ICD-10-CM

## 2024-01-20 DIAGNOSIS — I428 Other cardiomyopathies: Secondary | ICD-10-CM | POA: Diagnosis not present

## 2024-01-20 DIAGNOSIS — I2602 Saddle embolus of pulmonary artery with acute cor pulmonale: Secondary | ICD-10-CM

## 2024-01-20 DIAGNOSIS — Z7901 Long term (current) use of anticoagulants: Secondary | ICD-10-CM | POA: Diagnosis not present

## 2024-01-20 DIAGNOSIS — G40909 Epilepsy, unspecified, not intractable, without status epilepticus: Secondary | ICD-10-CM | POA: Diagnosis not present

## 2024-01-20 DIAGNOSIS — Z86718 Personal history of other venous thrombosis and embolism: Secondary | ICD-10-CM | POA: Insufficient documentation

## 2024-01-20 DIAGNOSIS — I110 Hypertensive heart disease with heart failure: Secondary | ICD-10-CM | POA: Insufficient documentation

## 2024-01-20 DIAGNOSIS — E119 Type 2 diabetes mellitus without complications: Secondary | ICD-10-CM | POA: Diagnosis not present

## 2024-01-20 DIAGNOSIS — G4733 Obstructive sleep apnea (adult) (pediatric): Secondary | ICD-10-CM | POA: Insufficient documentation

## 2024-01-20 DIAGNOSIS — I2782 Chronic pulmonary embolism: Secondary | ICD-10-CM

## 2024-01-20 DIAGNOSIS — I5032 Chronic diastolic (congestive) heart failure: Secondary | ICD-10-CM | POA: Insufficient documentation

## 2024-01-20 DIAGNOSIS — Z86711 Personal history of pulmonary embolism: Secondary | ICD-10-CM | POA: Insufficient documentation

## 2024-01-20 DIAGNOSIS — K219 Gastro-esophageal reflux disease without esophagitis: Secondary | ICD-10-CM | POA: Insufficient documentation

## 2024-01-20 DIAGNOSIS — I509 Heart failure, unspecified: Secondary | ICD-10-CM | POA: Diagnosis present

## 2024-01-20 DIAGNOSIS — R5383 Other fatigue: Secondary | ICD-10-CM | POA: Diagnosis present

## 2024-01-20 NOTE — Progress Notes (Signed)
 [facility] - HEART FAILURE CLINIC - PHARMACIST COUNSELING NOTE  Adherence Assessment  Do you ever forget to take your medication? [] Yes [x] No  Do you ever skip doses due to side effects? [] Yes [x] No  Do you have trouble affording your medicines? [] Yes [x] No  Are you ever unable to pick up your medication due to transportation difficulties? [] Yes [x] No  Do you ever stop taking your medications because you don't believe they are helping? [] Yes [x] No  Do you check your weight daily? [x] Yes [] No  Do you check your blood pressure daily? [] Yes [x] No  Adherence strategy: Carries bag of meds with him at all times   Barriers to obtaining medications: None reported   Vital signs: HR 70, BP 102/80, weight 327 lbs.  ECHO: Date 06/15/2023, EF 65-70% Renal function: Date 11/21/23, GFR 57  Current Guideline-Directed Medical Therapy/Evidence Based Medicine  ACE/ARB/ARNI: Sacubitril-valsartan 97-103 mg twice daily Target dose: On target dose   Beta Blocker: Metoprolol succinate 25 mg daily Target dose: 200 mg daily  Aldosterone Antagonist: Spironolactone 25 mg daily Target dose: 50 mg daily   SGLT2i: Dapagliflozin 10 mg daily Target dose: On target dose  Diuretic: Torsemide 60 mg daily with additional 20-40 mg PM PRN  ASSESSMENT 48 year old male who presents to the HF clinic for a 2 month follow-up appointment. PMH is significant for HTN, gout, GERD, seizures, obstructive sleep apnea (wearing Bipap), PE (08/24), DVT, DM, previous tobacco use and chronic heart failure. No ED visits or hospitalizations in the past 6 months. Mentions that he has quit smoking and is able to walk more without getting short of breath. Will sometimes take an extra torsemide dose in the middle of the day.   PLAN Recommendations discussed with NP Continue current regimen as directed by NP Consider titration of metoprolol and spironolactone to target doses if/when able  Annual ECHO has been  scheduled in April   Time spent: 20 minutes   Littie Deeds, PharmD Pharmacy Resident  01/20/2024 9:22 AM

## 2024-01-20 NOTE — Progress Notes (Signed)
 Advanced Heart Failure Clinic Note    PCP: Ronald Reichmann, MD (last seen 02/25) Cardiologist: Ronald Peng, MD (last seen 09/23)  Chief Complaint: fatigue  HPI:  [name] is a 48 y/o male with a history of HTN, gout, GERD, seizures, obstructive sleep apnea (wearing Bipap), PE (08/24), DVT, DM, previous tobacco use and chronic heart failure.   Echo 02/03/18: EF of 55-60% along with mild MR with normal PA pressure.  Admitted 02/27/20 due to acute on chronic HF and syncope. Cardiology consult obtained. Echo 02/27/20: EF of 45-50% along with mild MR and mildly elevated PA pressure.  Initially given IV lasix with transition to oral diuretics. Brain MRI was negative. Discharged the following day.   Admitted 06/13/23 due to progressively worsening shortness of breath associated with lightheadedness and feeling like he is going to pass out when ambulating. CT angio chest showing large burden of PE originating in the bilateral main pulmonary arteries extending into the lobar segmental and subsegmental branches with right heart strain, RV/LV ratio of 1.7. Received catheter directed tPA on 8/20 and then started on a heparin infusion and given a dose of Lasix.   Echo 06/14/23: EF of 65-70% along with acute PE and right ventricular systolic function is severely reduced. The right ventricular size is severely enlarged.   He presents for a HF clinic follow-up visit with a chief complaint of very little fatigue. Has no other symptoms and specifically denies any shortness of breath, chest pain, palpitations, abdominal distention, pedal edema, dizziness, weight gain or difficulty sleeping. Overall, he says that he feels great.    Has not smoked since 08/24. Now walking more and walked for 30 minutes yesterday without any symptoms. Has also been decreasing carbs intake. Occasionally takes an extra 20mg  torsemide but says that it's not very often that he does this.   Past Medical History:  Diagnosis Date    CHF (congestive heart failure) (HCC)    Diverticulosis large intestine w/o perforation or abscess w/o bleeding 12/11/2016   Dysrhythmia    tacchycardia new..  put on metoprolol by dr. 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   Umbilical hernia     Current Outpatient Medications  Medication Sig Dispense Refill   allopurinol (ZYLOPRIM) 300 MG tablet Take 300 mg by mouth daily.      apixaban (ELIQUIS) 5 MG TABS tablet Take 1 tablet (5 mg total) by mouth 2 (two) times daily. 60 tablet 11   colchicine 0.6 MG tablet Take 0.6-1.2 mg by mouth See admin instructions. Take 2 tablets (1.2mg ) by mouth at onset of gout flare - take 1 additional tablet (0.6mg ) by mouth after an hour if needed     metoprolol succinate (TOPROL-XL) 25 MG 24 hr tablet Take 1 tablet (25 mg total) by mouth daily. 90 tablet 3   omeprazole (PRILOSEC OTC) 20 MG tablet Take 20 mg by mouth daily.     sacubitril-valsartan (ENTRESTO) 97-103 MG Take 1 tablet by mouth 2 (two) times daily.     spironolactone (ALDACTONE) 25 MG tablet Take 1 tablet (25 mg total) by mouth daily. 90 tablet 3   torsemide (DEMADEX) 20 MG tablet Take 3 tablets (60 mg total) by mouth daily. Can take an extra 20mg  ( 1 tab) in the evening if needed for weight gain of  3 lbs or swelling 180 tablet 3   No current facility-administered medications for this visit.    No Known Allergies    Social History   Socioeconomic History   Marital status: Divorced    Spouse name: Not on file   Number of children: Not on file   Years of education: Not on file   Highest education level: Not on file  Occupational History   Not on file  Tobacco Use   Smoking status: Every Day    Current packs/day: 1.00    Average packs/day: 1 pack/day for 20.0 years (20.0 ttl pk-yrs)    Types: Cigarettes   Smokeless tobacco:  Never  Substance and Sexual Activity   Alcohol use: No    Comment: SOBER SINCE 2010   Drug use: No   Sexual activity: Yes    Birth control/protection: None  Other Topics Concern   Not on file  Social History Narrative   Not on file   Social Drivers of Health   Financial Resource Strain: Low Risk  (12/01/2023)   Received [facility]   Overall Financial Resource Strain (CARDIA)    Difficulty of Paying Living Expenses: Not hard at all  Food Insecurity: No Food Insecurity (12/01/2023)   [facility]   Hunger Vital Sign    Worried About Running Out of Food in the Last Year: Never true    Ran Out of Food in the Last Year: Never true  Transportation Needs: No Transportation Needs (12/01/2023)   Received from [facility] - Transportation    In the past 12 months, has lack of transportation kept you from medical appointments or from getting medications?: No    Lack of Transportation (Non-Medical): No  Physical Activity: Not on file  Stress: Not on file  Social Connections: Not on file  Intimate Partner Violence: Not At Risk (06/14/2023)   Humiliation, Afraid, Rape, and Kick questionnaire    Fear of Current or Ex-Partner: No    Emotionally Abused: No    Physically Abused: No    Sexually Abused: No      Family History  Problem Relation Age of Onset   Breast cancer Mother    Heart disease Mother    Prostate cancer Father    Colon cancer Father    Diabetes Sister    Heart disease Sister    Vitals:   01/20/24 0957  BP: 102/80  Pulse: 70  SpO2: 94%  Weight: (!) 327 lb (148.3 kg)   Wt Readings from Last 3 Encounters:  01/20/24 (!) 327 lb (148.3 kg)  11/25/23 (!) 337 lb 12.8 oz (153.2 kg)  11/21/23 (!) 337 lb 9.6 oz (153.1 kg)   Lab Results  Component Value Date   CREATININE 1.52 (H) 11/21/2023   CREATININE 1.46 (H) 10/17/2023   CREATININE 1.45 (H) 09/16/2023    PHYSICAL EXAM:  General:  Well appearing. No resp difficulty HEENT: normal Neck: supple, no JVD Cor: Regular rhythm, rate. No rubs, gallops or murmurs Lungs: clear Abdomen: soft, nontender, nondistended. Extremities: no cyanosis, clubbing, rash, edema Neuro: alert & oriented X 3. Moves all 4 extremities w/o difficulty. Affect pleasant    ECG: not done   ASSESSMENT & PLAN:  1: NICM with preserved ejection fraction- - suspect due to HTN/ OSA - NYHA class II - euvolemic  - weighing daily; reminded to call for an overnight weight gain of >2 pounds or a weekly weight gain  of >5 pounds - weight down 10 pounds from last visit here 2 months ago - Echo 02/27/20: EF of 45-50% along with mild MR and mildly elevated PA pressure.   - Echo 06/14/23: EF of 65-70% along with acute PE and right ventricular systolic function is severely reduced. The right ventricular size is severely enlarged.  - will get echo updated and see if RV function has improved - not adding salt to his food and has been reading food labels. Reviewed the importance of closely following a 2000mg  sodium diet  - saw cardiology Cliffton Asters) 09/23; referral placed back to Dr. Glennis Brink office at last visit but he hasn't heard anything from them yet - continue farxiga 10mg  daily - continue metoprolol succinate 25mg  daily - continue entresto 97/103mg  BID - continue spironolactone 25mg  daily - continue torsemide 60mg  daily with additional 20-40mg  PM PRN - BNP 06/13/23 was 801.7  2: HTN- - BP 102/80 - saw PCP (Hande) 02/25 - BMP 11/21/23 reviewed and showed sodium 140, potassium 4.4, creatinine 1.52 and GFR 57 - BMET today  3: Tobacco use- - continues to not smoke since his admission 08/24 - congratulated him on that and encouraged continued cessation  4: Obstructive sleep apnea- - emphasized wearing bipap nightly - last sleep study done 06/30/23 - saw pulmonology Betti Cruz) 01/25  5: PE/ DVT- - diagnosed 08/24 - thrombectomy/thrombolysis done 06/14/23 -  doppler on 06/14/23 positive for nonocclusive thrombus within the left popliteal vein.  - continue apixaban 5mg  BID   Return 1 week after echo, sooner if needed.   Clarisa Kindred, FNP

## 2024-01-20 NOTE — Patient Instructions (Signed)
 Lab Work:  Go DOWN to LOWER LEVEL (LL) to have your blood work completed inside of [employer] office.  We will only call you if the results are abnormal or if the provider would like to make medication changes.    Testing/Procedures:  Please have your echo completed. You will check in for this at the [facility]. You have to arrive 15 MINS EARLY for preparation, otherwise you will have to reschedule.   Follow-Up in: 1 week after Echo.  At the [facility], you and your health needs are our priority. We have a designated team specialized in the treatment of Heart Failure. This Care Team includes your primary Heart Failure Specialized Cardiologist (physician), Advanced Practice Providers (APPs- Physician Assistants and Nurse Practitioners), and Pharmacist who all work together to provide you with the care you need, when you need it.   You may see any of the following providers on your designated Care Team at your next follow up:  Dr. Arvilla Meres Dr. Marca Ancona Dr. Dorthula Nettles Dr. Theresia Bough Clarisa Kindred, FNP Enos Fling, RPH-CPP  Please be sure to bring in all your medications bottles to every appointment.   Need to Contact us:  If you have any questions or concerns before your next appointment please send [place] a message through [place] or call our office at [phone].    TO LEAVE A MESSAGE FOR THE NURSE SELECT OPTION 2, PLEASE LEAVE A MESSAGE INCLUDING: YOUR NAME DATE OF BIRTH CALL BACK NUMBER REASON FOR CALL**this is important as we prioritize the call backs  YOU WILL RECEIVE A CALL BACK THE SAME DAY AS LONG AS YOU CALL BEFORE 4:00 PM

## 2024-01-21 LAB — BASIC METABOLIC PANEL WITH GFR
BUN/Creatinine Ratio: 25 — ABNORMAL HIGH (ref 9–20)
BUN: 44 mg/dL — ABNORMAL HIGH (ref 6–24)
CO2: 21 mmol/L (ref 20–29)
Calcium: 10.1 mg/dL (ref 8.7–10.2)
Chloride: 97 mmol/L (ref 96–106)
Creatinine, Ser: 1.73 mg/dL — ABNORMAL HIGH (ref 0.76–1.27)
Glucose: 124 mg/dL — ABNORMAL HIGH (ref 70–99)
Potassium: 5.8 mmol/L — ABNORMAL HIGH (ref 3.5–5.2)
Sodium: 137 mmol/L (ref 134–144)
eGFR: 48 mL/min/{1.73_m2} — ABNORMAL LOW (ref >59)

## 2024-01-23 ENCOUNTER — Telehealth: Payer: Self-pay

## 2024-01-23 DIAGNOSIS — I5032 Chronic diastolic (congestive) heart failure: Secondary | ICD-10-CM

## 2024-01-23 NOTE — Telephone Encounter (Signed)
 Reviewed lab results and provider recommendations with patient. Pt verbalized understanding and denied any questions. He agrees he will return to the [facility] next week for lab work.  BMET order placed.

## 2024-01-23 NOTE — Telephone Encounter (Signed)
-----   Message from Delma Freeze sent at 01/22/2024 11:48 AM EDT ----- Kidney function is a little worse and potassium is a little high. Make sure drinking 60-64 ounces of fluid daily, not using NoSalt seasoning and decrease consumption of high potassium foods like bananas, potatoes and dark green leafy vegetables.  Recheck BMET in 1 week.

## 2024-02-17 ENCOUNTER — Ambulatory Visit: Admission: RE | Admit: 2024-02-17 | Source: Ambulatory Visit

## 2024-02-23 ENCOUNTER — Ambulatory Visit: Payer: Managed Care, Other (non HMO) | Admitting: Sleep Medicine

## 2024-02-23 ENCOUNTER — Encounter: Payer: Self-pay | Admitting: Sleep Medicine

## 2024-02-23 ENCOUNTER — Telehealth: Payer: Self-pay | Admitting: Family

## 2024-02-23 VITALS — BP 118/62 | HR 57 | Temp 97.1°F | Ht 73.0 in | Wt 319.2 lb

## 2024-02-23 DIAGNOSIS — Z6841 Body Mass Index (BMI) 40.0 and over, adult: Secondary | ICD-10-CM | POA: Diagnosis not present

## 2024-02-23 DIAGNOSIS — G4733 Obstructive sleep apnea (adult) (pediatric): Secondary | ICD-10-CM | POA: Diagnosis not present

## 2024-02-23 DIAGNOSIS — I5030 Unspecified diastolic (congestive) heart failure: Secondary | ICD-10-CM

## 2024-02-23 DIAGNOSIS — F17210 Nicotine dependence, cigarettes, uncomplicated: Secondary | ICD-10-CM

## 2024-02-23 NOTE — Patient Instructions (Signed)

## 2024-02-23 NOTE — Progress Notes (Signed)
 Name:Ronald Pratt MRN: [id number] DOB: May 28, 1976   CHIEF COMPLAINT:  PAP F/U   HISTORY OF PRESENT ILLNESS:  Ronald Pratt is a 48 y.o. w/ a h/o OSA, CHF and morbid obesity who presents to follow up on OSA. Reports not getting BIPAP device due to financial constraints. States that he is also hesitant to use PAP due to mask discomfort.    PAST MEDICAL HISTORY :   has a past medical history of CHF (congestive heart failure) (HCC), Diverticulosis large intestine w/o perforation or abscess w/o bleeding (12/11/2016), Dysrhythmia, GERD (gastroesophageal reflux disease), Gout, Hypertension, Obstructive sleep apnea, Seizures (HCC), and Umbilical hernia.  has a past surgical history that includes Esophagogastroduodenoscopy endoscopy (2014); Umbilical hernia repair (N/A, 01/04/2017); Insertion of mesh (N/A, 01/04/2017); and PULMONARY THROMBECTOMY (Bilateral, 06/14/2023). Prior to Admission medications   Medication Sig Start Date End Date Taking? Authorizing Provider  allopurinol  (ZYLOPRIM ) 300 MG tablet Take 300 mg by mouth daily.    Yes [provider]  apixaban  (ELIQUIS ) 5 MG TABS tablet Take 1 tablet (5 mg total) by mouth 2 (two) times daily. 07/13/23  Yes Shawnee Dellen A, FNP  colchicine 0.6 MG tablet Take 0.6-1.2 mg by mouth See admin instructions. Take 2 tablets (1.2mg ) by mouth at onset of gout flare - take 1 additional tablet (0.6mg ) by mouth after an hour if needed   Yes [provider]  dapagliflozin  propanediol (FARXIGA ) 10 MG TABS tablet Take 10 mg by mouth daily.   Yes [provider]  metoprolol  succinate (TOPROL -XL) 25 MG 24 hr tablet Take 1 tablet (25 mg total) by mouth daily. 09/16/23  Yes Hackney, Brian Campanile A, FNP  omeprazole  (PRILOSEC  OTC) 20 MG tablet Take 20 mg by mouth daily.   Yes [provider]  sacubitril-valsartan (ENTRESTO) 97-103 MG Take 1 tablet by mouth 2 (two) times daily. 11/04/22  Yes [provider]  torsemide  (DEMADEX ) 20 MG  tablet Take 3 tablets (60 mg total) by mouth daily. Can take an extra 20mg  ( 1 tab) in the evening if needed for weight gain of 3 lbs or swelling 11/02/23  Yes Shawnee Dellen A, FNP  spironolactone  (ALDACTONE ) 25 MG tablet Take 1 tablet (25 mg total) by mouth daily. 11/02/23 01/31/24  Charlette Console, FNP   No Known Allergies  FAMILY HISTORY:  family history includes Breast cancer in his mother; Colon cancer in his father; Diabetes in his sister; Heart disease in his mother and sister; Prostate cancer in his father. SOCIAL HISTORY:  reports that he has been smoking cigarettes. He has a 20 pack-year smoking history. He has never used smokeless tobacco. He reports that he does not drink alcohol and does not use drugs.   Review of Systems:  Gen:  Denies  fever, sweats, chills weight loss  HEENT: Denies blurred vision, double vision, ear pain, eye pain, hearing loss, nose bleeds, sore throat Cardiac:  No dizziness, chest pain or heaviness, chest tightness,edema, No JVD Resp:   No cough, -sputum production, -shortness of breath,-wheezing, -hemoptysis,  Gi: Denies swallowing difficulty, stomach pain, nausea or vomiting, diarrhea, constipation, bowel incontinence Gu:  Denies bladder incontinence, burning urine Ext:   Denies Joint pain, stiffness or swelling Skin: Denies  skin rash, easy bruising or bleeding or hives Endoc:  Denies polyuria, polydipsia , polyphagia or weight change Psych:   Denies depression, insomnia or hallucinations  Other:  All other systems negative  VITAL SIGNS: BP 118/62 (BP Location: Right Arm, Cuff Size: Large)  Pulse (!) 57   Temp (!) 97.1 F (36.2 C)   Ht 6\' 1"  (1.854 m)   Wt (!) 319 lb 3.2 oz (144.8 kg)   SpO2 98%   BMI 42.11 kg/m     Physical Examination:   General Appearance: No distress  EYES PERRLA, EOM intact.   NECK Supple, No JVD Pulmonary: normal breath sounds, No wheezing.  Cardiovascular PVC's noted on exam   Abdomen: Benign, Soft,  non-tender. Skin:   warm, no rashes, no ecchymosis  Extremities: normal, no cyanosis, clubbing. Neuro:without focal findings,  speech normal  PSYCHIATRIC: Mood, affect within normal limits.   ASSESSMENT AND PLAN  OSA Counseled patient on the importance of using PAP therapy every night. Advised patient to get the new BIPAP device. Discussed the consequences of untreated sleep apnea. Advised not to drive drowsy for safety of patient and others. Will follow up in 3 months to review PAP efficacy and compliance data.  CHF Stable, following with cardiology. Advised patient to follow up with cardiology for PVC's.  Morbid obesity Counseled patient on diet and lifestyle modification.    Patient  satisfied with Plan of action and management. All questions answered  I spent a total of 34 minutes reviewing chart data, face-to-face evaluation with the patient, counseling and coordination of care as detailed above.    Natalyah Cummiskey, M.D.  Sleep Medicine [facility]

## 2024-02-23 NOTE — Telephone Encounter (Signed)
 Called to confirm/remind patient of their appointment at the [facility] on 02/24/24.   Appointment:   [x] Confirmed  [] Left mess   [] No answer/No voice mail  [] VM Full/unable to leave message  [] Phone not in service  Patient reminded to bring all medications and/or complete list.  Confirmed patient has transportation. Gave directions, instructed to utilize valet parking.

## 2024-02-24 ENCOUNTER — Telehealth: Payer: Self-pay

## 2024-02-24 ENCOUNTER — Encounter: Payer: Self-pay | Admitting: Family

## 2024-02-24 ENCOUNTER — Other Ambulatory Visit
Admission: RE | Admit: 2024-02-24 | Discharge: 2024-02-24 | Disposition: A | Discharge status: Home / Self Care | Source: Ambulatory Visit | Attending: Family | Admitting: Family

## 2024-02-24 ENCOUNTER — Ambulatory Visit (HOSPITAL_BASED_OUTPATIENT_CLINIC_OR_DEPARTMENT_OTHER): Admitting: Family

## 2024-02-24 VITALS — BP 125/80 | HR 80 | Wt 316.8 lb

## 2024-02-24 DIAGNOSIS — I5032 Chronic diastolic (congestive) heart failure: Secondary | ICD-10-CM | POA: Diagnosis present

## 2024-02-24 DIAGNOSIS — I10 Essential (primary) hypertension: Secondary | ICD-10-CM | POA: Diagnosis not present

## 2024-02-24 DIAGNOSIS — I2782 Chronic pulmonary embolism: Secondary | ICD-10-CM

## 2024-02-24 DIAGNOSIS — I493 Ventricular premature depolarization: Secondary | ICD-10-CM

## 2024-02-24 DIAGNOSIS — E66813 Obesity, class 3: Secondary | ICD-10-CM

## 2024-02-24 DIAGNOSIS — G4733 Obstructive sleep apnea (adult) (pediatric): Secondary | ICD-10-CM | POA: Diagnosis not present

## 2024-02-24 DIAGNOSIS — I2602 Saddle embolus of pulmonary artery with acute cor pulmonale: Secondary | ICD-10-CM

## 2024-02-24 LAB — BASIC METABOLIC PANEL WITH GFR
Anion gap: 16 — ABNORMAL HIGH (ref 5–15)
BUN: 47 mg/dL — ABNORMAL HIGH (ref 6–20)
CO2: 21 mmol/L — ABNORMAL LOW (ref 22–32)
Calcium: 10 mg/dL (ref 8.9–10.3)
Chloride: 100 mmol/L (ref 98–111)
Creatinine, Ser: 1.91 mg/dL — ABNORMAL HIGH (ref 0.61–1.24)
GFR, Estimated: 43 mL/min — ABNORMAL LOW (ref >60)
Glucose, Bld: 96 mg/dL (ref 70–99)
Potassium: 4.4 mmol/L (ref 3.5–5.1)
Sodium: 137 mmol/L (ref 135–145)

## 2024-02-24 LAB — LIPID PANEL
Cholesterol: 177 mg/dL (ref 0–200)
HDL: 45 mg/dL (ref >40)
LDL Cholesterol: 115 mg/dL — ABNORMAL HIGH (ref 0–99)
Total CHOL/HDL Ratio: 3.9 ratio
Triglycerides: 87 mg/dL (ref <150)
VLDL: 17 mg/dL (ref 0–40)

## 2024-02-24 LAB — HEMOGLOBIN A1C
Hgb A1c MFr Bld: 6.7 % — ABNORMAL HIGH (ref 4.8–5.6)
Mean Plasma Glucose: 145.59 mg/dL

## 2024-02-24 MED ORDER — METOPROLOL SUCCINATE ER 25 MG PO TB24: 25.0000 mg | ORAL_TABLET | Freq: Every day | ORAL | 3 refills | Status: AC

## 2024-02-24 MED ORDER — ROSUVASTATIN CALCIUM 10 MG PO TABS
10.0000 mg | ORAL_TABLET | Freq: Every day | ORAL | 3 refills | Status: DC
Start: 2024-02-24 — End: 2024-05-29

## 2024-02-24 MED ORDER — TORSEMIDE 20 MG PO TABS: 40.0000 mg | ORAL_TABLET | Freq: Every day | ORAL | 3 refills | Status: DC

## 2024-02-24 NOTE — Progress Notes (Signed)
 [facility] - HEART FAILURE CLINIC - PHARMACIST COUNSELING NOTE  Guideline-Directed Medical Therapy/Evidence Based Medicine  ACE/ARB/ARNI: Sacubitril-valsartan 97-103 mg twice daily Beta Blocker: Metoprolol  succinate 25 mg daily - patient not taking at this time, expired prescription Aldosterone Antagonist: Spironolactone  25 mg daily Diuretic: Torsemide  60 mg daily with 20 mg PRN for fluid retention  SGLT2i: Dapagliflozin  10 mg daily  Adherence Assessment  Do you ever forget to take your medication? [] Yes [x] No  Do you ever skip doses due to side effects? [] Yes [x] No  Do you have trouble affording your medicines? [] Yes [x] No  Are you ever unable to pick up your medication due to transportation difficulties? [] Yes [x] No  Do you ever stop taking your medications because you don't believe they are helping? [] Yes [x] No  Do you check your weight daily? [] Yes [x] No   Adherence strategy: Keeping his medications in a bag and carries it on him at all time   Barriers to obtaining medications: None identified   Vital signs: HR 80, BP 125/80, weight (pounds) 316 lbs  PTA BP/HR: they don't monitor at home,  PTA Weight (every Monday): 328 lbs Renal Function: 01/20/2024 GFR 59 Echo 02/03/18: EF of 55-60% along with mild MR with normal PA pressure  Echo 02/27/20: EF of 45-50% along with mild MR and mildly elevated PA pressure.   Echo 06/14/23: EF of 65-70% along with acute PE and right ventricular systolic function is severely reduced. The right ventricular size is severely enlarged.      Latest Ref Rng & Units 01/20/2024   10:52 AM 11/21/2023    3:05 PM 10/17/2023    2:16 PM  BMP  Glucose 70 - 99 mg/dL 308  657  96   BUN 6 - 24 mg/dL 44  24  42   Creatinine 0.76 - 1.27 mg/dL 8.46  9.62  9.52   BUN/Creat Ratio 9 - 20 25  16     Sodium 134 - 144 mmol/L 137  140  139   Potassium 3.5 - 5.2 mmol/L 5.8  4.4  3.9   Chloride 96 - 106 mmol/L 97  101  104   CO2 20 - 29 mmol/L 21   23  26    Calcium 8.7 - 10.2 mg/dL 84.1  9.6  9.2    Past Medical History:  Diagnosis Date   CHF (congestive heart failure) (HCC)    Diverticulosis large intestine w/o perforation or abscess w/o bleeding 12/11/2016   Dysrhythmia    tacchycardia new..  put on metoprolol  by dr. 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   Umbilical hernia    ASSESSMENT Ronald Pratt is a 48 year old male who presents to the HF clinic for a follow-up visit after their yearly ECHO, however, they were a no show for when it was scheduled on 02/17/2024. Their past medical history is significant for HTN, gout, GERD, seizures, obstructive sleep apnea (wearing Bipap), PE (08/24), DVT, DM, previous tobacco use and chronic heart failure. They were last seen 01/20/2024 for their 2 month follow-up appointment. At this appointment no medication changes were made. Their weight was down 10 lbs. They remained hesitant about using PAP because it is uncomfortable.  When reviewing their medications at today's appointment, they are still taking all medications as previously prescribed (except the metoprolol ). They  are currently on maximum therapy for treatment benefits of HFpEF. BP at appointment yesterday was 118/80. Their BP is regularly with within goal of < 130/80.   Pertinent symptoms reviewed, patient reported no dizziness or shortness of breath. They are do not currently feel like they are holding on to fluids. They retain the fluid in their stomach and it makes them feel bloated. Deny any feelings of bloating today. Earlier this week, they reported taking an extra dose (20 mg) of torsemide . 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 They report they drink about 6 regular size water bottles and maybe more when it gets hot. They've  also been experiencing heart palpitations. Likely because they are not currently taking their metoprolol .   In reviewing recent lifestyles changes, their weight continues to drop. Today, 02/23/24 they were 319 down from 327 at the 01/20/24 appointment (an additional 8 lbs). Patient reported they've made significant lifestyle and diet changes since their hospitalization. They've quite smoking.   Recent ED Visit (past 6 months):  NA  PLAN  Prevention  LDL 95 02/28/20 -- No statin A1c 7.4 06/14/23 -- on farxiga    Recommendations -- BP is at goal, no further BP titration necessary at this time -- Consider holding spironolactone  and get repeat BMET since potassium was 5.8 on 01/20/24  -- recommend BMET today  -- Recommend resume metoprolol  succinate at 25 mg daily due to consistent tachycardia  -- Consider LDL and A1c today (re-evaluate lipids and BG since patient has made drastic diet changes and weight loss)  -- Consider increasing torsemide  to 80 mg daily during the summer months when patient's fluid intake increases 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

## 2024-02-24 NOTE — Patient Instructions (Addendum)
 Medication Changes:  START Metoprolol  25mg  tab daily  Lab Work:  Go over to the [facility]ICAL [facility]. Go pass the gift shop and have your blood work completed.  We will only call you if the results are abnormal or if the provider would like to make medication changes.   Testing/Procedures:  Please have your echo completed. You will check in for this at the [facility]. You have to arrive 15 MINS EARLY for preparation, otherwise you will have to reschedule.  Please follow up with [facility] their phone number is [phone].  Follow-Up in: Please follow up with the [facility] in 1 month with Shawnee Dellen, FNP.  At the [facility], you and your health needs are our priority. We have a designated team specialized in the treatment of Heart Failure. This Care Team includes your primary Heart Failure Specialized Cardiologist (physician), Advanced Practice Providers (APPs- Physician Assistants and Nurse Practitioners), and Pharmacist who all work together to provide you with the care you need, when you need it.   You may see any of the following providers on your designated Care Team at your next follow up:  Dr. Jules Oar Dr. Peder Bourdon Dr. Alwin Baars Dr. Judyth Nunnery Shawnee Dellen, FNP Bevely Brush, RPH-CPP  Please be sure to bring in all your medications bottles to every appointment.   Need to Contact Us :  If you have any questions or concerns before your next appointment please send us  a message through [place] or call our office at [phone].    TO LEAVE A MESSAGE FOR THE NURSE SELECT OPTION 2, PLEASE LEAVE A MESSAGE INCLUDING: YOUR NAME DATE OF BIRTH CALL BACK NUMBER REASON FOR CALL**this is important as we prioritize the call backs  YOU WILL RECEIVE A CALL BACK THE SAME DAY AS LONG AS YOU CALL BEFORE 4:00 PM

## 2024-02-24 NOTE — Telephone Encounter (Signed)
 Spoke with pt about lab work and new medications. Pt verbalizes understanding and is agreeable to picking up new medications today.

## 2024-02-24 NOTE — Telephone Encounter (Signed)
-----   Message from Charlette Console sent at 02/24/2024  1:01 PM EDT ----- Potassium level is back in the normal range. Kidney function is a little worse so decrease torsemide  to 40mg  daily. Your LDL (bad cholesterol) is higher than we want it to be. It's currently 115 and we want that number <70 if possible. I'd like to start you on crestor 10mg  once a day. Continue your diet and exercise like you have been doing.

## 2024-02-24 NOTE — Progress Notes (Signed)
 [facility] Note    PCP: Antonio Baumgarten, MD (last seen 02/25) Cardiologist: Burney Carter, MD (last seen 09/23)  Chief Complaint: fatigue   HPI:  Ronald Pratt is a 48 y/o male with a history of HTN, gout, GERD, seizures, obstructive sleep apnea (wearing Bipap), PE (08/24), DVT, DM, previous tobacco use and chronic heart failure.   Echo 02/03/18: EF of 55-60% along with mild Ronald with normal PA pressure.  Admitted 02/27/20 due to acute on chronic HF and syncope. Cardiology consult obtained. Echo 02/27/20: EF of 45-50% along with mild Ronald and mildly elevated PA pressure.  Initially given IV lasix  with transition to oral diuretics. Brain MRI was negative. Discharged the following day.   Admitted 06/13/23 due to progressively worsening shortness of breath associated with lightheadedness and feeling like he is going to pass out when ambulating. CT angio chest showing large burden of PE originating in the bilateral main pulmonary arteries extending into the lobar segmental and subsegmental branches with right heart strain, RV/LV ratio of 1.7. Received catheter directed tPA on 8/20 and then started on a heparin  infusion and given a dose of Lasix .   Echo 06/14/23: EF of 65-70% along with acute PE and right ventricular systolic function is severely reduced. The right ventricular size is severely enlarged.   Last seen in HF clinic 01/20/24. Echo was scheduled and labs drawn. Potassium was elevated and dietary instructions given with subsequent BMET to be done in 1 week.   Saw pulmonology yesterday and they were concerned about his HR/ PVC's.   He presents for a HF clinic follow-up visit with a chief complaint of very little fatigue. Has no other symptoms and says that he "feels great". Has been walking 1 mile every day and eating better. Is not currently taking metoprolol  and doesn't recall the last time he took it. Has not smoked since 08/24. Rarely takes extra torsemide .   He did not  show for his echo and did not get repeat lab work drawn. Says that he gets busy at work and just forgot.   Past Medical History:  Diagnosis Date   CHF (congestive heart failure) (HCC)    Diverticulosis large intestine w/o perforation or abscess w/o bleeding 12/11/2016   Dysrhythmia    tacchycardia new..  put on metoprolol  by dr. 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   Umbilical hernia     Current Outpatient Medications  Medication Sig Dispense Refill   allopurinol  (ZYLOPRIM ) 300 MG tablet Take 300 mg by mouth daily.      apixaban  (ELIQUIS ) 5 MG TABS tablet Take 1 tablet (5 mg total) by mouth 2 (two) times daily. 60 tablet 11   colchicine 0.6 MG tablet Take 0.6-1.2 mg by mouth See admin instructions. Take 2 tablets (1.2mg ) by mouth at onset of gout flare - take 1 additional tablet (0.6mg ) by mouth after an hour if needed     dapagliflozin  propanediol (FARXIGA ) 10 MG TABS tablet Take 10 mg by mouth daily.     metoprolol  succinate (TOPROL -XL) 25 MG 24 hr tablet Take 1 tablet (25 mg total) by mouth daily. 90 tablet 3   omeprazole  (PRILOSEC  OTC) 20 MG tablet Take 20 mg by mouth daily.     sacubitril-valsartan (ENTRESTO) 97-103 MG Take 1 tablet by mouth 2 (  two) times daily.     spironolactone  (ALDACTONE ) 25 MG tablet Take 1 tablet (25 mg total) by mouth daily. 90 tablet 3   torsemide  (DEMADEX ) 20 MG tablet Take 3 tablets (60 mg total) by mouth daily. Can take an extra 20mg  ( 1 tab) in the evening if needed for weight gain of 3 lbs or swelling 180 tablet 3   No current facility-administered medications for this visit.    No Known Allergies    Social History   Socioeconomic History   Marital status: Divorced    Spouse name: Not on file   Number of children: Not on file   Years of education: Not on file   Highest education  level: Not on file  Occupational History   Not on file  Tobacco Use   Smoking status: Former    Current packs/day: 0.00    Average packs/day: 1 pack/day for 20.0 years (20.0 ttl pk-yrs)    Types: Cigarettes    Quit date: 06/13/2023    Years since quitting: 0.7   Smokeless tobacco: Never   Tobacco comments:    Started smoking at 74-15 yrs old    Smoked 2 PPD at his heaviest.     Quit smoking on 06/13/2023  Substance and Sexual Activity   Alcohol use: No    Comment: SOBER SINCE 2010   Drug use: No   Sexual activity: Yes    Birth control/protection: None  Other Topics Concern   Not on file  Social History Narrative   Not on file   Social Drivers of Health   Financial Resource Strain: Low Risk  (12/01/2023)   Received from [facility]   Overall Financial Resource Strain (CARDIA)    Difficulty of Paying Living Expenses: Not hard at all  Food Insecurity: No Food Insecurity (12/01/2023)   Received from [facility]   Hunger Vital Sign    Worried About Running Out of Food in the Last Year: Never true    Ran Out of Food in the Last Year: Never true  Transportation Needs: No Transportation Needs (12/01/2023)   Received from [facility] - Transportation    In the past 12 months, has lack of transportation kept you from medical appointments or from getting medications?: No    Lack of Transportation (Non-Medical): No  Physical Activity: Not on file  Stress: Not on file  Social Connections: Not on file  Intimate Partner Violence: Not At Risk (06/14/2023)   Humiliation, Afraid, Rape, and Kick questionnaire    Fear of Current or Ex-Partner: No    Emotionally Abused: No    Physically Abused: No    Sexually Abused: No      Family History  Problem Relation Age of Onset   Breast cancer Mother    Heart disease Mother    Prostate cancer Father    Colon cancer Father    Diabetes Sister    Heart disease Sister    Vitals:    02/24/24 0942  BP: 125/80  Pulse: 80  SpO2: 96%  Weight: (!) 316 lb 12.8 oz (143.7 kg)   Wt Readings from Last 3 Encounters:  02/24/24 (!) 316 lb 12.8 oz (143.7 kg)  02/23/24 (!) 319 lb 3.2 oz (144.8 kg)  01/20/24 (!) 327 lb (148.3 kg)   Lab Results  Component Value Date   CREATININE 1.73 (H) 01/20/2024   CREATININE 1.52 (H) 11/21/2023   CREATININE 1.46 (H) 10/17/2023  PHYSICAL EXAM:  General: Well appearing. No resp difficulty HEENT: normal Neck: supple, no JVD Cor: Regular rhythm, rate. No rubs, gallops or murmurs Lungs: clear Abdomen: soft, nontender, nondistended. Extremities: no cyanosis, clubbing, rash, edema Neuro: alert & oriented X 3. Moves all 4 extremities w/o difficulty. Affect pleasant   ECG: ST with PAC, HR 125   ASSESSMENT & PLAN:  1: NICM with preserved ejection fraction- - suspect due to HTN/ OSA - NYHA class II - euvolemic  - weighing daily; reminded to call for an overnight weight gain of >2 pounds or a weekly weight gain of >5 pounds - weight down 11 pounds from last visit here 1 month ago - Echo 02/27/20: EF of 45-50% along with mild Ronald and mildly elevated PA pressure.   - Echo 06/14/23: EF of 65-70% along with acute PE and right ventricular systolic function is severely reduced. The right ventricular size is severely enlarged.  - NS for echo 04/25; this will be r/s - not adding salt to his food and has been reading food labels. Walking 1 mile daily - saw cardiology Camilo Cella) 09/23; will place cardiology referral back to Dr. Lyndal Sandy office - continue farxiga  10mg  daily - continue entresto 97/103mg  BID - continue spironolactone  25mg  daily - continue torsemide  60mg  daily with additional 20-40mg  PM PRN (took PRN dose a few days ago) - BNP 06/13/23 was 801.7  2: HTN- - BP 125/80 - saw PCP (Hande) 02/25 - BMP 01/20/24 reviewed: sodium 137, potassium 5.8, creatinine 1.73 and GFR 48 (he did not get repeat lab work drawn) - BMET today  3:  Obesity- - has lost 11 pounds since last visit by walking 1 mile daily and changing his diet - congratulated him on this and encouraged him to continue - lipid panel and A1c today  4: Obstructive sleep apnea- - last sleep study done 06/30/23 - saw pulmonology Kieran Pellet) 05/25 - pulm note says patient has not gotten bipap due to financial constraints and is hesitant to use it due to mask discomfort  5: PE/ DVT- - diagnosed 08/24 - thrombectomy/thrombolysis done 06/14/23 - doppler on 06/14/23 positive for nonocclusive thrombus within the left popliteal vein.  - continue apixaban  5mg  BID  6: PVC's- - begin metoprolol  succinate 25mg  daily - EKG today shows ST with PAC, HR 125 - consider zio    Return in 1 month, sooner if needed.   Shawnee Dellen, [place] 02/24/24

## 2024-03-29 ENCOUNTER — Telehealth: Payer: Self-pay | Admitting: Family

## 2024-03-29 NOTE — Progress Notes (Unsigned)
 Advanced [facility] Failure Clinic Note    PCP: Antonio Baumgarten, MD (last seen 02/25) Cardiologist: Burney Carter, MD (last seen 09/23)  Chief Complaint: fatigue   HPI:  Mr Englert is a 48 y/o male with a history of HTN, gout, GERD, seizures, obstructive sleep apnea (wearing Bipap), PE (08/24), DVT, DM, previous tobacco use and chronic heart failure.   Echo 02/03/18: EF of 55-60% along with mild MR with normal PA pressure.  Admitted 02/27/20 due to acute on chronic HF and syncope. Cardiology consult obtained. Echo 02/27/20: EF of 45-50% along with mild MR and mildly elevated PA pressure.  Initially given IV lasix  with transition to oral diuretics. Brain MRI was negative. Discharged the following day.   Admitted 06/13/23 due to progressively worsening shortness of breath associated with lightheadedness and feeling like he is going to pass out when ambulating. CT angio chest showing large burden of PE originating in the bilateral main pulmonary arteries extending into the lobar segmental and subsegmental branches with right heart strain, RV/LV ratio of 1.7. Received catheter directed tPA on 8/20 and then started on a heparin  infusion and given a dose of Lasix .   Echo 06/14/23: EF of 65-70% along with acute PE and right ventricular systolic function is severely reduced. The right ventricular size is severely enlarged.   Last seen in HF clinic 01/20/24. Echo was scheduled and labs drawn. Potassium was elevated and dietary instructions given with subsequent BMET to be done in 1 week.   Saw pulmonology yesterday and they were concerned about his HR/ PVC's.   He presents for a HF clinic follow-up visit with a chief complaint of very little fatigue. Has no other symptoms and says that he "feels great". Has been walking 1 mile every day and eating better. Is not currently taking metoprolol  and doesn't recall the last time he took it. Has not smoked since 08/24. Rarely takes extra torsemide .   He did not  show for his echo and did not get repeat lab work drawn. Says that he gets busy at work and just forgot.   Past Medical History:  Diagnosis Date   CHF (congestive heart failure) (HCC)    Diverticulosis large intestine w/o perforation or abscess w/o bleeding 12/11/2016   Dysrhythmia    tacchycardia new..  put on metoprolol  by dr. 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   Umbilical hernia     Current Outpatient Medications  Medication Sig Dispense Refill   allopurinol  (ZYLOPRIM ) 300 MG tablet Take 300 mg by mouth daily.      apixaban  (ELIQUIS ) 5 MG TABS tablet Take 1 tablet (5 mg total) by mouth 2 (two) times daily. 60 tablet 11   colchicine 0.6 MG tablet Take 0.6-1.2 mg by mouth See admin instructions. Take 2 tablets (1.2mg ) by mouth at onset of gout flare - take 1 additional tablet (0.6mg ) by mouth after an hour if needed     dapagliflozin  propanediol (FARXIGA ) 10 MG TABS tablet Take 10 mg by mouth daily.     metoprolol  succinate (TOPROL -XL) 25 MG 24 hr tablet Take 1 tablet (25 mg total) by mouth daily. 90 tablet 3   omeprazole  (PRILOSEC  OTC) 20 MG tablet Take 20 mg by mouth daily.     predniSONE (DELTASONE) 10 MG tablet Take 10 mg by mouth  as needed (gout). Taking as needed for gout flares     rosuvastatin  (CRESTOR ) 10 MG tablet Take 1 tablet (10 mg total) by mouth daily. 90 tablet 3   sacubitril-valsartan (ENTRESTO) 97-103 MG Take 1 tablet by mouth 2 (two) times daily.     spironolactone  (ALDACTONE ) 25 MG tablet Take 1 tablet (25 mg total) by mouth daily. 90 tablet 3   torsemide  (DEMADEX ) 20 MG tablet Take 2 tablets (40 mg total) by mouth daily. Can take an extra 20mg  ( 1 tab) in the evening if needed for weight gain of 3 lbs or swelling 180 tablet 3   No current facility-administered medications for this visit.    No Known  Allergies    Social History   Socioeconomic History   Marital status: Divorced    Spouse name: Not on file   Number of children: Not on file   Years of education: Not on file   Highest education level: Not on file  Occupational History   Not on file  Tobacco Use   Smoking status: Former    Current packs/day: 0.00    Average packs/day: 1 pack/day for 20.0 years (20.0 ttl pk-yrs)    Types: Cigarettes    Quit date: 06/13/2023    Years since quitting: 0.7   Smokeless tobacco: Never   Tobacco comments:    Started smoking at [age] yrs old    Smoked 2 PPD at his heaviest.     Quit smoking on 06/13/2023  Substance and Sexual Activity   Alcohol use: No    Comment: SOBER SINCE 2010   Drug use: No   Sexual activity: Yes    Birth control/protection: None  Other Topics Concern   Not on file  Social History Narrative   Not on file   Social Drivers of Health   Financial Resource Strain: Low Risk  (12/01/2023)   Received from [facility]   Overall Financial Resource Strain (CARDIA)    Difficulty of Paying Living Expenses: Not hard at all  Food Insecurity: No Food Insecurity (12/01/2023)   Received from [facility]   Hunger Vital Sign    Worried About Running Out of Food in the Last Year: Never true    Ran Out of Food in the Last Year: Never true  Transportation Needs: No Transportation Needs (12/01/2023)   Received from [facility] - Transportation    In the past 12 months, has lack of transportation kept you from medical appointments or from getting medications?: No    Lack of Transportation (Non-Medical): No  Physical Activity: Not on file  Stress: Not on file  Social Connections: Not on file  Intimate Partner Violence: Not At Risk (06/14/2023)   Humiliation, Afraid, Rape, and Kick questionnaire    Fear of Current or Ex-Partner: No    Emotionally Abused: No    Physically Abused: No    Sexually Abused: No       Family History  Problem Relation Age of Onset   Breast cancer Mother    Heart disease Mother    Prostate cancer Father    Colon cancer Father    Diabetes Sister    Heart disease Sister    There were no vitals filed for this visit.  Wt Readings from Last 3 Encounters:  02/24/24 (!) 316 lb 12.8 oz (143.7 kg)  02/23/24 (!) 319 lb 3.2 oz (144.8 kg)  01/20/24 (!) 327 lb (148.3 kg)  Lab Results  Component Value Date   CREATININE 1.91 (H) 02/24/2024   CREATININE 1.73 (H) 01/20/2024   CREATININE 1.52 (H) 11/21/2023    PHYSICAL EXAM:  General: Well appearing. No resp difficulty HEENT: normal Neck: supple, no JVD Cor: Regular rhythm, rate. No rubs, gallops or murmurs Lungs: clear Abdomen: soft, nontender, nondistended. Extremities: no cyanosis, clubbing, rash, edema Neuro: alert & oriented X 3. Moves all 4 extremities w/o difficulty. Affect pleasant   ECG: ST with PAC, HR 125   ASSESSMENT & PLAN:  1: NICM with preserved ejection fraction- - suspect due to HTN/ OSA - NYHA class II - euvolemic  - weighing daily; reminded to call for an overnight weight gain of >2 pounds or a weekly weight gain of >5 pounds - weight down 11 pounds from last visit here 1 month ago - Echo 02/27/20: EF of 45-50% along with mild MR and mildly elevated PA pressure.   - Echo 06/14/23: EF of 65-70% along with acute PE and right ventricular systolic function is severely reduced. The right ventricular size is severely enlarged.  - NS for echo 04/25; this will be r/s - not adding salt to his food and has been reading food labels. Walking 1 mile daily - saw cardiology Camilo Cella) 09/23; will place cardiology referral back to Dr. Lyndal Sandy office - continue farxiga  10mg  daily - continue entresto 97/103mg  BID - continue spironolactone  25mg  daily - continue torsemide  60mg  daily with additional 20-40mg  PM PRN (took PRN dose a few days ago) - BNP 06/13/23 was 801.7  2: HTN- - BP 125/80 - saw PCP (Hande)  02/25 - BMP 01/20/24 reviewed: sodium 137, potassium 5.8, creatinine 1.73 and GFR 48 (he did not get repeat lab work drawn) - BMET today  3: Obesity- - has lost 11 pounds since last visit by walking 1 mile daily and changing his diet - congratulated him on this and encouraged him to continue - lipid panel and A1c today  4: Obstructive sleep apnea- - last sleep study done 06/30/23 - saw pulmonology Kieran Pellet) 05/25 - pulm note says patient has not gotten bipap due to financial constraints and is hesitant to use it due to mask discomfort  5: PE/ DVT- - diagnosed 08/24 - thrombectomy/thrombolysis done 06/14/23 - doppler on 06/14/23 positive for nonocclusive thrombus within the left popliteal vein.  - continue apixaban  5mg  BID  6: PVC's- - begin metoprolol  succinate 25mg  daily - EKG today shows ST with PAC, HR 125 - consider zio    Return in 1 month, sooner if needed.   Shawnee Dellen, [place] 02/24/24

## 2024-03-29 NOTE — Telephone Encounter (Signed)
 Called to confirm/remind patient of their appointment at the [facility] Clinic on 03/30/24.   Appointment:   [x] Confirmed  [] Left mess   [] No answer/No voice mail  [] VM Full/unable to leave message  [] Phone not in service  Patient reminded to bring all medications and/or complete list.  Confirmed patient has transportation. Gave directions, instructed to utilize valet parking.

## 2024-03-30 ENCOUNTER — Encounter: Payer: Self-pay | Admitting: Family

## 2024-03-30 ENCOUNTER — Ambulatory Visit: Attending: Family | Admitting: Family

## 2024-03-30 VITALS — BP 99/77 | HR 71 | Wt 310.8 lb

## 2024-03-30 DIAGNOSIS — E66813 Obesity, class 3: Secondary | ICD-10-CM | POA: Diagnosis not present

## 2024-03-30 DIAGNOSIS — Z7984 Long term (current) use of oral hypoglycemic drugs: Secondary | ICD-10-CM | POA: Insufficient documentation

## 2024-03-30 DIAGNOSIS — Z86718 Personal history of other venous thrombosis and embolism: Secondary | ICD-10-CM | POA: Diagnosis not present

## 2024-03-30 DIAGNOSIS — G4733 Obstructive sleep apnea (adult) (pediatric): Secondary | ICD-10-CM | POA: Diagnosis not present

## 2024-03-30 DIAGNOSIS — I10 Essential (primary) hypertension: Secondary | ICD-10-CM

## 2024-03-30 DIAGNOSIS — E669 Obesity, unspecified: Secondary | ICD-10-CM | POA: Diagnosis not present

## 2024-03-30 DIAGNOSIS — Z79899 Other long term (current) drug therapy: Secondary | ICD-10-CM | POA: Diagnosis not present

## 2024-03-30 DIAGNOSIS — E782 Mixed hyperlipidemia: Secondary | ICD-10-CM

## 2024-03-30 DIAGNOSIS — I2782 Chronic pulmonary embolism: Secondary | ICD-10-CM

## 2024-03-30 DIAGNOSIS — I428 Other cardiomyopathies: Secondary | ICD-10-CM | POA: Diagnosis not present

## 2024-03-30 DIAGNOSIS — E785 Hyperlipidemia, unspecified: Secondary | ICD-10-CM | POA: Diagnosis not present

## 2024-03-30 DIAGNOSIS — E119 Type 2 diabetes mellitus without complications: Secondary | ICD-10-CM | POA: Insufficient documentation

## 2024-03-30 DIAGNOSIS — I5032 Chronic diastolic (congestive) heart failure: Secondary | ICD-10-CM | POA: Diagnosis not present

## 2024-03-30 DIAGNOSIS — Z7901 Long term (current) use of anticoagulants: Secondary | ICD-10-CM | POA: Insufficient documentation

## 2024-03-30 DIAGNOSIS — I110 Hypertensive heart disease with heart failure: Secondary | ICD-10-CM | POA: Insufficient documentation

## 2024-03-30 DIAGNOSIS — I493 Ventricular premature depolarization: Secondary | ICD-10-CM | POA: Insufficient documentation

## 2024-03-30 DIAGNOSIS — I2602 Saddle embolus of pulmonary artery with acute cor pulmonale: Secondary | ICD-10-CM

## 2024-03-30 DIAGNOSIS — Z87891 Personal history of nicotine dependence: Secondary | ICD-10-CM | POA: Diagnosis not present

## 2024-03-30 DIAGNOSIS — Z86711 Personal history of pulmonary embolism: Secondary | ICD-10-CM | POA: Diagnosis not present

## 2024-03-30 MED ORDER — TORSEMIDE 20 MG PO TABS: 20.0000 mg | ORAL_TABLET | Freq: Every day | ORAL | 3 refills | Status: AC

## 2024-03-30 NOTE — Patient Instructions (Addendum)
 Do not forget about your echo on June 24th at 10:00am. Make sure you show up at 9:30am at the [facility]  Medication Changes:  DECREASE Torsemide  to 20mg  (1 tab) every day  Lab Work:  Go DOWN to LOWER LEVEL (LL) to have your blood work completed inside of [employer] office.  We will only call you if the results are abnormal or if the provider would like to make medication changes.   Follow-Up in: Please follow up with the Advanced [facility] Failure Clinic in July with Shawnee Dellen, FNP.  At the [facility] Clinic, you and your health needs are our priority. We have a designated team specialized in the treatment of Heart Failure. This Care Team includes your primary Heart Failure Specialized Cardiologist (physician), Advanced Practice Providers (APPs- Physician Assistants and Nurse Practitioners), and Pharmacist who all work together to provide you with the care you need, when you need it.   You may see any of the following providers on your designated Care Team at your next follow up:  Dr. Jules Oar Dr. Peder Bourdon Dr. Alwin Baars Dr. Judyth Nunnery Shawnee Dellen, FNP Bevely Brush, RPH-CPP  Please be sure to bring in all your medications bottles to every appointment.   Need to Contact Us :  If you have any questions or concerns before your next appointment please send us  a message through [place] or call our office at [phone].    TO LEAVE A MESSAGE FOR THE NURSE SELECT OPTION 2, PLEASE LEAVE A MESSAGE INCLUDING: YOUR NAME DATE OF BIRTH CALL BACK NUMBER REASON FOR CALL**this is important as we prioritize the call backs  YOU WILL RECEIVE A CALL BACK THE SAME DAY AS LONG AS YOU CALL BEFORE 4:00 PM

## 2024-03-31 LAB — BASIC METABOLIC PANEL WITH GFR
BUN/Creatinine Ratio: 44 — ABNORMAL HIGH (ref 9–20)
BUN: 82 mg/dL (ref 6–24)
CO2: 17 mmol/L — ABNORMAL LOW (ref 20–29)
Calcium: 9.4 mg/dL (ref 8.7–10.2)
Chloride: 101 mmol/L (ref 96–106)
Creatinine, Ser: 1.87 mg/dL — ABNORMAL HIGH (ref 0.76–1.27)
Glucose: 91 mg/dL (ref 70–99)
Potassium: 5.3 mmol/L — ABNORMAL HIGH (ref 3.5–5.2)
Sodium: 135 mmol/L (ref 134–144)
eGFR: 44 mL/min/{1.73_m2} — ABNORMAL LOW (ref >59)

## 2024-04-01 ENCOUNTER — Ambulatory Visit: Payer: Self-pay | Admitting: Family

## 2024-04-01 DIAGNOSIS — I5032 Chronic diastolic (congestive) heart failure: Secondary | ICD-10-CM

## 2024-04-02 MED ORDER — SPIRONOLACTONE 25 MG PO TABS: 12.5000 mg | ORAL_TABLET | Freq: Every day | ORAL | 3 refills | Status: AC

## 2024-04-02 NOTE — Telephone Encounter (Signed)
 Called and spoke with pt to relay lab results and make aware of med changes. Pt verbalized understanding and is agreeable to plan.

## 2024-04-02 NOTE — Telephone Encounter (Signed)
-----   Message from Charlette Console sent at 04/01/2024  6:34 PM EDT ----- Potassium is a little high and one of your kidney function tests is high. We reduced your torsemide  at your last visit but I'd also like to decrease your spironolactone  back to 12.5mg  daily (1/2 tablet). Recheck BMET in 1 week.

## 2024-04-17 ENCOUNTER — Ambulatory Visit
Admission: RE | Admit: 2024-04-17 | Discharge: 2024-04-17 | Disposition: A | Discharge status: Home / Self Care | Source: Ambulatory Visit | Attending: Family | Admitting: Family

## 2024-04-17 DIAGNOSIS — I110 Hypertensive heart disease with heart failure: Secondary | ICD-10-CM | POA: Insufficient documentation

## 2024-04-17 DIAGNOSIS — I5032 Chronic diastolic (congestive) heart failure: Secondary | ICD-10-CM

## 2024-04-17 LAB — ECHOCARDIOGRAM COMPLETE
AR max vel: 2.35 cm2
AV Area VTI: 2.44 cm2
AV Area mean vel: 2.39 cm2
AV Mean grad: 5 mmHg
AV Peak grad: 9.1 mmHg
Ao pk vel: 1.51 m/s
Area-P 1/2: 3.15 cm2
S' Lateral: 3.5 cm

## 2024-04-17 NOTE — Progress Notes (Signed)
*  PRELIMINARY RESULTS* Echocardiogram 2D Echocardiogram has been performed.  Ronald Pratt 04/17/2024, 10:28 AM

## 2024-04-18 ENCOUNTER — Ambulatory Visit: Payer: Self-pay | Admitting: Family

## 2024-05-25 ENCOUNTER — Ambulatory Visit: Admitting: Sleep Medicine

## 2024-05-28 ENCOUNTER — Telehealth: Payer: Self-pay | Admitting: Family

## 2024-05-28 NOTE — Progress Notes (Unsigned)
 Advanced Heart Failure Clinic Note    PCP: Sadie Manna, MD  Cardiologist: Florencio Kava, MD (last seen 09/23)  Chief Complaint: HF visit   HPI:  [name] is a 48 y/o male with a history of HTN, gout, GERD, seizures, obstructive sleep apnea (wearing Bipap), PE (08/24), DVT, DM, previous tobacco use and chronic heart failure.   Echo 02/03/18: EF of 55-60% along with mild MR with normal PA pressure.  Admitted 02/27/20 due to acute on chronic HF and syncope. Cardiology consult obtained. Echo 02/27/20: EF of 45-50% along with mild MR and mildly elevated PA pressure. Initially given IV lasix  with transition to oral diuretics. Brain MRI was negative. Discharged the following day.   Admitted 06/13/23 due to progressively worsening shortness of breath associated with lightheadedness and feeling like he is going to pass out when ambulating. CT angio chest showing large burden of PE originating in the bilateral main pulmonary arteries extending into the lobar segmental and subsegmental branches with right heart strain, RV/LV ratio of 1.7. Received catheter directed tPA on 8/20 and then started on a heparin  infusion and given a dose of Lasix .   Echo 06/14/23: EF of 65-70% along with acute PE and right ventricular systolic function is severely reduced. The right ventricular size is severely enlarged.   Seen in HF clinic 01/20/24. Echo was scheduled and labs drawn. Potassium was elevated and dietary instructions given with subsequent BMET to be done in 1 week.   Saw pulmonology 05/25 and they were concerned about his HR/ PVC's.   Seen in [facility] 05/25 and after lab results obtained, torsemide  decreased to 40mg  due to worsening renal function and crestor  10mg  was started. Toprol  was started for PVC's.   Seen in [facility] 06/25 where torsemide  was decreased to 20mg  daily with additional 20mg  PRN. After lab results obtained, spironolactone  was decreased to 12.5mg  daily due to elevated K+.   Echo 04/17/24: EF  55-60%, mild LVH, normal RV, trivial MR  He presents with a chief complaint of a HF visit. Denies any shortness of breath, fatigue, chest pain, edema, palpitations or dizziness. Continues to lose weight as he's cut out all starches and is very active. Soon to be 1 year since he last smoked. Overall, he says that he feels great.   He has not heard from [facility] cardiology about scheduling an appointment. Referral was placed 01/25 &, again, 05/25.  ROS: All systems negative except what is listed in HPI, PMH and Problem List  Past Medical History:  Diagnosis Date   CHF (congestive heart failure) (HCC)    Diverticulosis large intestine w/o perforation or abscess w/o bleeding 12/11/2016   Dysrhythmia    tacchycardia new..  put on metoprolol  by dr. 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   Umbilical hernia     Current Outpatient Medications  Medication Sig Dispense Refill   allopurinol  (ZYLOPRIM ) 300 MG tablet Take 300 mg by mouth daily.      apixaban  (ELIQUIS ) 5 MG TABS tablet Take 1 tablet (5 mg total) by mouth 2 (two) times daily. 60 tablet 11   colchicine 0.6 MG tablet Take 0.6-1.2 mg by mouth See admin instructions. Take 2 tablets (1.2mg ) by mouth at onset of gout flare - take 1 additional tablet (0.6mg ) by mouth after an hour if  needed     dapagliflozin  propanediol (FARXIGA ) 10 MG TABS tablet Take 10 mg by mouth daily.     metoprolol  succinate (TOPROL -XL) 25 MG 24 hr tablet Take 1 tablet (25 mg total) by mouth daily. 90 tablet 3   omeprazole  (PRILOSEC  OTC) 20 MG tablet Take 20 mg by mouth daily.     predniSONE (DELTASONE) 10 MG tablet Take 10 mg by mouth as needed (gout). Taking as needed for gout flares (Patient not taking: Reported on 03/30/2024)     rosuvastatin  (CRESTOR ) 10 MG tablet Take 1 tablet (10 mg total) by  mouth daily. 90 tablet 3   sacubitril-valsartan (ENTRESTO) 97-103 MG Take 1 tablet by mouth 2 (two) times daily.     spironolactone  (ALDACTONE ) 25 MG tablet Take 0.5 tablets (12.5 mg total) by mouth daily. 90 tablet 3   torsemide  (DEMADEX ) 20 MG tablet Take 1 tablet (20 mg total) by mouth daily. Can take an extra 20mg  ( 1 tab) in the evening if needed for weight gain of 3 lbs or swelling 90 tablet 3   No current facility-administered medications for this visit.    No Known Allergies    Social History   Socioeconomic History   Marital status: Divorced    Spouse name: Not on file   Number of children: Not on file   Years of education: Not on file   Highest education level: Not on file  Occupational History   Not on file  Tobacco Use   Smoking status: Former    Current packs/day: 0.00    Average packs/day: 1 pack/day for 20.0 years (20.0 ttl pk-yrs)    Types: Cigarettes    Quit date: 06/13/2023    Years since quitting: 0.9   Smokeless tobacco: Never   Tobacco comments:    Started smoking at [age] yrs old    Smoked 2 PPD at his heaviest.     Quit smoking on 06/13/2023  Substance and Sexual Activity   Alcohol use: No    Comment: SOBER SINCE 2010   Drug use: No   Sexual activity: Yes    Birth control/protection: None  Other Topics Concern   Not on file  Social History Narrative   Not on file   Social Drivers of Health   Financial Resource Strain: Low Risk  (04/03/2024)   Received from [facility]   Overall Financial Resource Strain (CARDIA)    Difficulty of Paying Living Expenses: Not hard at all  Food Insecurity: No Food Insecurity (04/03/2024)   Received from [facility]   Hunger Vital Sign    Within the past 12 months, you worried that your food would run out before you got the money to buy more.: Never true    Within the past 12 months, the food you bought just didn't last and you didn't have money to get more.: Never true   Transportation Needs: No Transportation Needs (04/03/2024)   Received from [facility] - Transportation    In the past 12 months, has lack of transportation kept you from medical appointments or from getting medications?: No    Lack of Transportation (Non-Medical): No  Physical Activity: Not on file  Stress: Not on file  Social Connections: Not on file  Intimate Partner Violence: Not At Risk (06/14/2023)   Humiliation, Afraid, Rape, and Kick questionnaire    Fear of Current or Ex-Partner: No    Emotionally Abused: No    Physically Abused:  No    Sexually Abused: No      Family History  Problem Relation Age of Onset   Breast cancer Mother    Heart disease Mother    Prostate cancer Father    Colon cancer Father    Diabetes Sister    Heart disease Sister    Vitals:   05/29/24 0833  BP: 108/82  Pulse: 88  SpO2: 97%  Weight: 292 lb 6 oz (132.6 kg)   Wt Readings from Last 3 Encounters:  05/29/24 292 lb 6 oz (132.6 kg)  03/30/24 (!) 310 lb 12.8 oz (141 kg)  02/24/24 (!) 316 lb 12.8 oz (143.7 kg)   Lab Results  Component Value Date   CREATININE 1.87 (H) 03/30/2024   CREATININE 1.91 (H) 02/24/2024   CREATININE 1.73 (H) 01/20/2024   PHYSICAL EXAM:  General: Well appearing.  Cor: No JVD. Regular rhythm, rate.  Lungs: clear Abdomen: soft, nontender, nondistended. Extremities: no edema Neuro:. Affect pleasant   ECG: not done   ASSESSMENT & PLAN:  1: NICM with preserved ejection fraction- - suspect due to HTN/ OSA - NYHA class I - euvolemic  - weighing daily; reminded to call for an overnight weight gain of >2 pounds or a weekly weight gain of >5 pounds - weight down 18 pounds from last visit here 2 months ago. Reports eating healthier and being more active.  - Echo 02/27/20: EF of 45-50% along with mild MR and mildly elevated PA pressure.   - Echo 06/14/23: EF of 65-70% along with acute PE and right ventricular systolic function is severely  reduced. The right ventricular size is severely enlarged.  - Echo 04/17/24: EF 55-60%, mild LVH, normal RV, trivial MR - not adding salt to his food and has been reading food labels. Walking 1 mile daily.  - saw cardiology Isabel) 09/23; cardiology referral to Dr. Bernita office was placed at last visit. He hasn't heard anything from them so their OV number was provided for him to call and schedule an appointment - continue farxiga  10mg  daily - continue entresto 97/103mg  BID - continue spironolactone  12.5mg  daily - continue torsemide  20mg  daily with additional 20mg  PM PRN. Has not had to take any PRN doses.  - BNP 06/13/23 was 801.7  2: HTN- - BP 108/82 - saw PCP (Hande) 06/25 - BMP 03/30/24 reviewed: sodium 135, potassium 5.3, creatinine 1.87 and GFR 44  - BMET today  3: Obesity- - has lost another 18 pounds since last visit by walking 1 mile daily and changing his diet. Has cut out all starchy foods - congratulated him on this and encouraged him to continue - A1c 02/24/24 was 6.7%  4: Obstructive sleep apnea- - last sleep study done 06/30/23 - saw pulmonology Mickael) 05/25 - pulm note says patient has not gotten bipap due to financial constraints and is hesitant to use it due to mask discomfort  5: PE/ DVT- - diagnosed 08/24 - thrombectomy/thrombolysis done 06/14/23 - doppler on 06/14/23 positive for nonocclusive thrombus within the left popliteal vein.  - continue apixaban  5mg  BID  6: Hyperlipidemia- - LDL 02/24/24 was 115 and rosuvastatin  was started - continue rosuvastatin  10mg  daily - lipid panel today   Return in 4 months, sooner if needed.   Ellouise Class, [place] 05/28/24

## 2024-05-28 NOTE — Telephone Encounter (Signed)
 Called to confirm/remind patient of their appointment at the [facility] on 05/29/24.   Appointment:   [x] Confirmed  [] Left mess   [] No answer/No voice mail  [] VM Full/unable to leave message  [] Phone not in service  Patient reminded to bring all medications and/or complete list.  Confirmed patient has transportation. Gave directions, instructed to utilize valet parking.

## 2024-05-29 ENCOUNTER — Ambulatory Visit: Payer: Self-pay | Admitting: Family

## 2024-05-29 ENCOUNTER — Other Ambulatory Visit
Admission: RE | Admit: 2024-05-29 | Discharge: 2024-05-29 | Disposition: A | Discharge status: Home / Self Care | Payer: Self-pay | Source: Ambulatory Visit | Attending: Family | Admitting: Family

## 2024-05-29 ENCOUNTER — Encounter: Payer: Self-pay | Admitting: Family

## 2024-05-29 ENCOUNTER — Other Ambulatory Visit: Payer: Self-pay | Admitting: Family

## 2024-05-29 VITALS — BP 108/82 | HR 88 | Wt 292.4 lb

## 2024-05-29 DIAGNOSIS — E782 Mixed hyperlipidemia: Secondary | ICD-10-CM

## 2024-05-29 DIAGNOSIS — I10 Essential (primary) hypertension: Secondary | ICD-10-CM

## 2024-05-29 DIAGNOSIS — I2782 Chronic pulmonary embolism: Secondary | ICD-10-CM

## 2024-05-29 DIAGNOSIS — I5032 Chronic diastolic (congestive) heart failure: Secondary | ICD-10-CM | POA: Insufficient documentation

## 2024-05-29 DIAGNOSIS — I2602 Saddle embolus of pulmonary artery with acute cor pulmonale: Secondary | ICD-10-CM

## 2024-05-29 DIAGNOSIS — G4733 Obstructive sleep apnea (adult) (pediatric): Secondary | ICD-10-CM

## 2024-05-29 DIAGNOSIS — E66813 Obesity, class 3: Secondary | ICD-10-CM

## 2024-05-29 LAB — BASIC METABOLIC PANEL WITH GFR
Anion gap: 11 (ref 5–15)
BUN: 39 mg/dL — ABNORMAL HIGH (ref 6–20)
CO2: 26 mmol/L (ref 22–32)
Calcium: 9.5 mg/dL (ref 8.9–10.3)
Chloride: 101 mmol/L (ref 98–111)
Creatinine, Ser: 1.46 mg/dL — ABNORMAL HIGH (ref 0.61–1.24)
GFR, Estimated: 59 mL/min — ABNORMAL LOW (ref >60)
Glucose, Bld: 102 mg/dL — ABNORMAL HIGH (ref 70–99)
Potassium: 3.7 mmol/L (ref 3.5–5.1)
Sodium: 138 mmol/L (ref 135–145)

## 2024-05-29 LAB — LIPID PANEL
Cholesterol: 154 mg/dL (ref 0–200)
HDL: 44 mg/dL (ref >40)
LDL Cholesterol: 96 mg/dL (ref 0–99)
Total CHOL/HDL Ratio: 3.5 ratio
Triglycerides: 71 mg/dL (ref <150)
VLDL: 14 mg/dL (ref 0–40)

## 2024-05-29 MED ORDER — ROSUVASTATIN CALCIUM 20 MG PO TABS
20.0000 mg | ORAL_TABLET | Freq: Every day | ORAL | 3 refills | Status: AC
Start: 2024-05-29 — End: ?

## 2024-05-29 NOTE — Patient Instructions (Addendum)
 Call Dr. Bernita office at [phone] to schedule an appointment.   Labs done today, your results will be available in MyChart, we will contact you for abnormal readings.   Follow Up: In 4 months with [facility]

## 2024-07-22 ENCOUNTER — Emergency Department
Admission: EM | Admit: 2024-07-22 | Discharge: 2024-07-23 | Disposition: A | Discharge status: Home / Self Care | Payer: Self-pay | Attending: Emergency Medicine | Admitting: Emergency Medicine

## 2024-07-22 ENCOUNTER — Emergency Department: Payer: Self-pay

## 2024-07-22 ENCOUNTER — Other Ambulatory Visit: Payer: Self-pay

## 2024-07-22 DIAGNOSIS — I110 Hypertensive heart disease with heart failure: Secondary | ICD-10-CM | POA: Insufficient documentation

## 2024-07-22 DIAGNOSIS — R0602 Shortness of breath: Secondary | ICD-10-CM | POA: Insufficient documentation

## 2024-07-22 DIAGNOSIS — I509 Heart failure, unspecified: Secondary | ICD-10-CM | POA: Insufficient documentation

## 2024-07-22 DIAGNOSIS — R112 Nausea with vomiting, unspecified: Secondary | ICD-10-CM | POA: Insufficient documentation

## 2024-07-22 DIAGNOSIS — R1010 Upper abdominal pain, unspecified: Secondary | ICD-10-CM | POA: Insufficient documentation

## 2024-07-22 LAB — CBC
HCT: 40.9 % (ref 39.0–52.0)
Hemoglobin: 13.8 g/dL (ref 13.0–17.0)
MCH: 30.5 pg (ref 26.0–34.0)
MCHC: 33.7 g/dL (ref 30.0–36.0)
MCV: 90.5 fL (ref 80.0–100.0)
Platelets: 244 K/uL (ref 150–400)
RBC: 4.52 MIL/uL (ref 4.22–5.81)
RDW: 13.6 % (ref 11.5–15.5)
WBC: 9.2 K/uL (ref 4.0–10.5)
nRBC: 0 % (ref 0.0–0.2)

## 2024-07-22 LAB — BASIC METABOLIC PANEL WITH GFR
Anion gap: 11 (ref 5–15)
BUN: 14 mg/dL (ref 6–20)
CO2: 25 mmol/L (ref 22–32)
Calcium: 8.5 mg/dL — ABNORMAL LOW (ref 8.9–10.3)
Chloride: 107 mmol/L (ref 98–111)
Creatinine, Ser: 1.24 mg/dL (ref 0.61–1.24)
GFR, Estimated: >60 mL/min (ref >60)
Glucose, Bld: 115 mg/dL — ABNORMAL HIGH (ref 70–99)
Potassium: 3.7 mmol/L (ref 3.5–5.1)
Sodium: 143 mmol/L (ref 135–145)

## 2024-07-22 LAB — BRAIN NATRIURETIC PEPTIDE: B Natriuretic Peptide: 68.4 pg/mL (ref 0.0–100.0)

## 2024-07-22 LAB — TROPONIN I (HIGH SENSITIVITY): Troponin I (High Sensitivity): 6 ng/L (ref <18)

## 2024-07-22 MED ORDER — ONDANSETRON 4 MG PO TBDP
4.0000 mg | ORAL_TABLET | Freq: Once | ORAL | Status: AC
  Administered 2024-07-23: 4 mg via ORAL
  Filled 2024-07-22: qty 1

## 2024-07-22 MED ORDER — FAMOTIDINE 20 MG PO TABS
40.0000 mg | ORAL_TABLET | Freq: Once | ORAL | Status: AC
  Administered 2024-07-23: 40 mg via ORAL
  Filled 2024-07-22 (×2): qty 2

## 2024-07-22 MED ORDER — ALUM & MAG HYDROXIDE-SIMETH 200-200-20 MG/5ML PO SUSP
30.0000 mL | Freq: Once | ORAL | Status: AC
  Administered 2024-07-23: 30 mL via ORAL
  Filled 2024-07-22: qty 30

## 2024-07-22 NOTE — ED Provider Notes (Signed)
 [facility] Provider Note    Event Date/Time   First MD Initiated Contact with Patient 07/22/24 2320     (approximate)   History   Chief Complaint: Chest Pain and Shortness of Breath   HPI  Ronald Pratt is a 48 y.o. male with a past history of CHF, hypertension, diabetes, gout who comes ED complaining of nausea vomiting, inability to tolerate anything by mouth due to upper abdominal pain.  No fever chills chest pain.  He does endorse increased shortness of breath lately, as well as an 8 pound weight gain.  No leg swelling, no PND or orthopnea or dyspnea on exertion.        Past Medical History:  Diagnosis Date   CHF (congestive heart failure) (HCC)    Diverticulosis large intestine w/o perforation or abscess w/o bleeding 12/11/2016   Dysrhythmia    tacchycardia new..  put on metoprolol  by dr. 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   Umbilical hernia     Current Outpatient Rx   Order #: [id number] Class: Normal   Order #: [id number] Class: Normal   Order #: [id number] Class: Normal   Order #: [id number] Class: Normal   Order #: [id number] Class: Normal   Order #: [id number] Class: Historical Med   Order #: [id number] Class: Normal   Order #: [id number] Class: Historical Med   [Paused] Order #: [id number] Class: Historical Med   Order #: [id number] Class: Historical Med   Order #: [id number] Class: Normal   Order #: [id number] Class: Normal   Order #: [id number] Class: Historical Med   Order #: [id number] Class: Normal   [Paused] Order #: [id number] Class: Historical Med   Order #: [id number] Class: Normal   Order #: [id number] Class: Normal    Past Surgical History:  Procedure Laterality Date   ESOPHAGOGASTRODUODENOSCOPY ENDOSCOPY  2014   INSERTION OF MESH N/A 01/04/2017   Procedure: INSERTION OF MESH;   Surgeon: Aloysius Plant, MD;  Location: [facility] ORS;  Service: General;  Laterality: N/A;   PULMONARY THROMBECTOMY Bilateral 06/14/2023   Procedure: PULMONARY THROMBECTOMY;  Surgeon: Marea Selinda RAMAN, MD;  Location: [facility];  Service: Cardiovascular;  Laterality: Bilateral;   UMBILICAL HERNIA REPAIR N/A 01/04/2017   Procedure: HERNIA REPAIR UMBILICAL ADULT with mesh;  Surgeon: Aloysius Plant, MD;  Location: [facility] ORS;  Service: General;  Laterality: N/A;    Physical Exam   Triage Vital Signs: ED Triage Vitals  Encounter Vitals Group     BP 07/22/24 2038 (!) 137/94     Girls Systolic BP Percentile --      Girls Diastolic BP Percentile --      Boys Systolic BP Percentile --      Boys Diastolic BP Percentile --      Pulse Rate 07/22/24 2036 (!) 102     Resp 07/22/24 2036 20     Temp 07/22/24 2036 98.1 F (36.7 C)     Temp src --      SpO2 07/22/24 2036 100 %     Weight 07/22/24 2034 290 lb (131.5 kg)     Height 07/22/24 2034 6' 1 (1.854 m)     Head Circumference --      Peak Flow --      Pain Score 07/22/24 2034 10     Pain Loc --      Pain  Education --      Exclude from Growth Chart --     Most recent vital signs: Vitals:   07/22/24 2036 07/22/24 2038  BP:  (!) 137/94  Pulse: (!) 102   Resp: 20   Temp: 98.1 F (36.7 C)   SpO2: 100%     General: Awake, no distress.  CV:  Good peripheral perfusion.  Regular rate rhythm Resp:  Normal effort.  Clear lungs Abd:  No distention.  Soft nontender Other:  No pitting edema.  No JVD.   ED Results / Procedures / Treatments   Labs (all labs ordered are listed, but only abnormal results are displayed) Labs Reviewed  BASIC METABOLIC PANEL WITH GFR - Abnormal; Notable for the following components:      Result Value   Glucose, Bld 115 (*)    Calcium  8.5 (*)    All other components within normal limits  CBC  BRAIN NATRIURETIC PEPTIDE  HEPATIC FUNCTION PANEL  LIPASE, BLOOD  TROPONIN I (HIGH SENSITIVITY)  TROPONIN I (HIGH  SENSITIVITY)     EKG Interpreted by me Sinus tachycardia rate 105.  Normal axis, normal intervals.  Normal QRS ST segments T waves.  No ischemic changes.   RADIOLOGY Chest x-ray interpreted by me, no edema or effusion.  Radiology report reviewed   PROCEDURES:  Procedures   MEDICATIONS ORDERED IN ED: Medications  ondansetron  (ZOFRAN -ODT) disintegrating tablet 4 mg (4 mg Oral Given 07/23/24 0052)  famotidine  (PEPCID ) tablet 40 mg (40 mg Oral Given 07/23/24 0055)  alum & mag hydroxide-simeth (MAALOX/MYLANTA) 200-200-20 MG/5ML suspension 30 mL (30 mLs Oral Given 07/23/24 0052)  torsemide  (DEMADEX ) tablet 20 mg (20 mg Oral Given 07/23/24 0410)  spironolactone  (ALDACTONE ) tablet 12.5 mg (12.5 mg Oral Given 07/23/24 0409)     IMPRESSION / MDM / ASSESSMENT AND PLAN / ED COURSE  I reviewed the triage vital signs and the nursing notes.  DDx: Non-STEMI, gastritis, AKI, electrolyte derangement, hyperglycemia  Patient's presentation is most consistent with acute presentation with potential threat to life or bodily function.  Patient presents with upper GI symptoms in the setting of poor medication adherence due to financial difficulty.  Has some shortness of breath, no acute chest pain, clinically low suspicion for decompensated CHF.  Doubt PE, dissection, pericardial effusion.  Abdomen is benign.  Suspect gastritis related to abrupt discontinuation of his PPI.  Will give antacids, check labs, will plan to prescribe alternative medication regimen that is more affordable out-of-pocket.   ----------------------------------------- 4:12 AM on 07/23/2024 ----------------------------------------- Serial troponins are normal, the rest of the workup is reassuring.  He is tolerating oral intake in the ED.  Stable for discharge      FINAL CLINICAL IMPRESSION(S) / ED DIAGNOSES   Final diagnoses:  Upper abdominal pain  Nausea and vomiting, unspecified vomiting type     Rx / DC Orders   ED  Discharge Orders          Ordered    spironolactone  (ALDACTONE ) 25 MG tablet  Daily        07/23/24 0306    valsartan (DIOVAN) 80 MG tablet  Daily        07/23/24 0306    metoprolol  succinate (TOPROL  XL) 25 MG 24 hr tablet  Daily        07/23/24 0306    metFORMIN (GLUCOPHAGE) 500 MG tablet  2 times daily with meals        07/23/24 0306    torsemide  (DEMADEX ) 20 MG tablet  Daily  07/23/24 0306    omeprazole  (PRILOSEC  OTC) 20 MG tablet  Daily        07/23/24 0306             Note:  This document was prepared using Dragon voice recognition software and may include unintentional dictation errors.   Viviann Pastor, MD 07/23/24 [phone]

## 2024-07-22 NOTE — ED Triage Notes (Signed)
 Pt reports chest pain and shortness of breath that began earlier today, pt denies cough congestion or fever. Pt reports he has hx CHF, pt reports taking prescribed medications as he is supposed to.

## 2024-07-23 ENCOUNTER — Other Ambulatory Visit (HOSPITAL_COMMUNITY): Payer: Self-pay

## 2024-07-23 ENCOUNTER — Other Ambulatory Visit: Payer: Self-pay

## 2024-07-23 LAB — HEPATIC FUNCTION PANEL
ALT: 44 U/L (ref 0–44)
AST: 30 U/L (ref 15–41)
Albumin: 4 g/dL (ref 3.5–5.0)
Alkaline Phosphatase: 73 U/L (ref 38–126)
Bilirubin, Direct: <0.1 mg/dL (ref 0.0–0.2)
Total Bilirubin: 0.7 mg/dL (ref 0.0–1.2)
Total Protein: 7.5 g/dL (ref 6.5–8.1)

## 2024-07-23 LAB — LIPASE, BLOOD: Lipase: 29 U/L (ref 11–51)

## 2024-07-23 LAB — TROPONIN I (HIGH SENSITIVITY): Troponin I (High Sensitivity): 6 ng/L (ref <18)

## 2024-07-23 MED ORDER — TORSEMIDE 20 MG PO TABS
20.0000 mg | ORAL_TABLET | Freq: Every day | ORAL | 2 refills | Status: AC
  Filled 2024-07-23: qty 30, 30d supply, fill #0

## 2024-07-23 MED ORDER — OMEPRAZOLE 20 MG PO CPDR
20.0000 mg | DELAYED_RELEASE_CAPSULE | Freq: Every day | ORAL | 0 refills | Status: AC
  Filled 2024-07-23: qty 28, 28d supply, fill #0
  Filled 2024-07-23: qty 90, 90d supply, fill #0

## 2024-07-23 MED ORDER — METOPROLOL SUCCINATE ER 25 MG PO TB24
25.0000 mg | ORAL_TABLET | Freq: Every day | ORAL | 2 refills | Status: AC
  Filled 2024-07-23 (×2): qty 30, 30d supply, fill #0

## 2024-07-23 MED ORDER — TORSEMIDE 20 MG PO TABS
20.0000 mg | ORAL_TABLET | ORAL | Status: AC
  Administered 2024-07-23: 20 mg via ORAL
  Filled 2024-07-23: qty 1

## 2024-07-23 MED ORDER — VALSARTAN 80 MG PO TABS
80.0000 mg | ORAL_TABLET | Freq: Every day | ORAL | 2 refills | Status: AC
  Filled 2024-07-23 (×2): qty 30, 30d supply, fill #0

## 2024-07-23 MED ORDER — SPIRONOLACTONE 25 MG PO TABS
12.5000 mg | ORAL_TABLET | Freq: Every day | ORAL | 2 refills | Status: AC
  Filled 2024-07-23 (×2): qty 15, 30d supply, fill #0

## 2024-07-23 MED ORDER — SPIRONOLACTONE 12.5 MG HALF TABLET
12.5000 mg | ORAL_TABLET | ORAL | Status: AC
  Administered 2024-07-23: 12.5 mg via ORAL
  Filled 2024-07-23: qty 1

## 2024-07-23 MED ORDER — METFORMIN HCL 500 MG PO TABS
500.0000 mg | ORAL_TABLET | Freq: Two times a day (BID) | ORAL | 0 refills | Status: AC
  Filled 2024-07-23: qty 60, 30d supply, fill #0

## 2024-08-02 ENCOUNTER — Other Ambulatory Visit: Payer: Self-pay

## 2024-09-28 ENCOUNTER — Telehealth: Payer: Self-pay | Admitting: Family

## 2024-09-28 NOTE — Telephone Encounter (Signed)
 Called to confirm/remind patient of their appointment at the [facility] on 10/01/24.   Appointment:   [x] Confirmed  [] Left mess   [] No answer/No voice mail  [] VM Full/unable to leave message  [] Phone not in service  Patient reminded to bring all medications and/or complete list.  Confirmed patient has transportation. Gave directions, instructed to utilize valet parking.

## 2024-09-30 NOTE — Progress Notes (Deleted)
 [facility] Note    PCP: Sadie Manna, MD  Cardiologist: Florencio Kava, MD (last seen 09/23)  Chief Complaint: HF visit   HPI:  [name] is a 48 y/o male with a history of HTN, gout, GERD, seizures, obstructive sleep apnea (wearing Bipap), PE (08/24), DVT, DM, previous tobacco use and chronic heart failure.   Echo 02/03/18: EF of 55-60% along with mild MR with normal PA pressure.  Admitted 02/27/20 due to acute on chronic HF and syncope. Cardiology consult obtained. Echo 02/27/20: EF of 45-50% along with mild MR and mildly elevated PA pressure. Initially given IV lasix  with transition to oral diuretics. Brain MRI was negative. Discharged the following day.   Admitted 06/13/23 due to progressively worsening shortness of breath associated with lightheadedness and feeling like he is going to pass out when ambulating. CT angio chest showing large burden of PE originating in the bilateral main pulmonary arteries extending into the lobar segmental and subsegmental branches with right heart strain, RV/LV ratio of 1.7. Received catheter directed tPA on 8/20 and then started on a heparin  infusion and given a dose of Lasix .   Echo 06/14/23: EF of 65-70% along with acute PE and right ventricular systolic function is severely reduced. The right ventricular size is severely enlarged.   Seen in HF clinic 01/20/24. Echo was scheduled and labs drawn. Potassium was elevated and dietary instructions given with subsequent BMET to be done in 1 week.   Saw pulmonology 05/25 and they were concerned about his HR/ PVC's.   Seen in [facility] 05/25 and after lab results obtained, torsemide  decreased to 40mg  due to worsening renal function and crestor  10mg  was started. Toprol  was started for PVC's.   Seen in [facility] 06/25 where torsemide  was decreased to 20mg  daily with additional 20mg  PRN. After lab results obtained, spironolactone  was decreased to 12.5mg  daily due to elevated K+.   Echo 04/17/24: EF  55-60%, mild LVH, normal RV, trivial MR  He presents with a chief complaint of a HF visit. Denies any shortness of breath, fatigue, chest pain, edema, palpitations or dizziness. Continues to lose weight as he's cut out all starches and is very active. Soon to be 1 year since he last smoked. Overall, he says that he feels great.   He has not heard from [facility] cardiology about scheduling an appointment. Referral was placed 01/25 &, again, 05/25.  ROS: All systems negative except what is listed in HPI, PMH and Problem List  Past Medical History:  Diagnosis Date   CHF (congestive heart failure) (HCC)    Diverticulosis large intestine w/o perforation or abscess w/o bleeding 12/11/2016   Dysrhythmia    tacchycardia new..  put on metoprolol  by dr. 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   Umbilical hernia     Current Outpatient Medications  Medication Sig Dispense Refill   allopurinol  (ZYLOPRIM ) 300 MG tablet Take 300 mg by mouth daily.      apixaban  (ELIQUIS ) 5 MG TABS tablet Take 1 tablet (5 mg total) by mouth 2 (two) times daily. 60 tablet 11   colchicine 0.6 MG tablet Take 0.6-1.2 mg by mouth See admin instructions. Take 2 tablets (1.2mg ) by mouth at onset of gout flare - take 1 additional tablet (0.6mg ) by mouth after an hour if  needed     [Paused] dapagliflozin  propanediol (FARXIGA ) 10 MG TABS tablet Take 10 mg by mouth daily.     ibuprofen  (ADVIL ) 200 MG tablet Take 400 mg by mouth every 8 (eight) hours as needed for headache.     metFORMIN  (GLUCOPHAGE ) 500 MG tablet Take 1 tablet (500 mg total) by mouth 2 (two) times daily with a meal. 180 tablet 0   metoprolol  succinate (TOPROL  XL) 25 MG 24 hr tablet Take 1 tablet (25 mg total) by mouth daily. 30 tablet 2   metoprolol  succinate (TOPROL -XL) 25 MG 24 hr tablet Take 1  tablet (25 mg total) by mouth daily. 90 tablet 3   omeprazole  (PRILOSEC ) 20 MG capsule Take 1 capsule (20 mg total) by mouth daily. 90 capsule 0   predniSONE (DELTASONE) 10 MG tablet Take 10 mg by mouth as needed (gout). Taking as needed for gout flares (Patient not taking: Reported on 05/29/2024)     rosuvastatin  (CRESTOR ) 20 MG tablet Take 1 tablet (20 mg total) by mouth daily. 90 tablet 3   [Paused] sacubitril-valsartan  (ENTRESTO) 97-103 MG Take 1 tablet by mouth 2 (two) times daily.     spironolactone  (ALDACTONE ) 25 MG tablet Take 0.5 tablets (12.5 mg total) by mouth daily. 90 tablet 3   spironolactone  (ALDACTONE ) 25 MG tablet Take 0.5 tablets (12.5 mg total) by mouth daily. 15 tablet 2   torsemide  (DEMADEX ) 20 MG tablet Take 1 tablet (20 mg total) by mouth daily. Can take an extra 20mg  ( 1 tab) in the evening if needed for weight gain of 3 lbs or swelling 90 tablet 3   torsemide  (DEMADEX ) 20 MG tablet Take 1 tablet (20 mg total) by mouth daily. 30 tablet 2   valsartan  (DIOVAN ) 80 MG tablet Take 1 tablet (80 mg total) by mouth daily. 30 tablet 2   No current facility-administered medications for this visit.    No Known Allergies    Social History   Socioeconomic History   Marital status: Divorced    Spouse name: Not on file   Number of children: Not on file   Years of education: Not on file   Highest education level: Not on file  Occupational History   Not on file  Tobacco Use   Smoking status: Former    Current packs/day: 0.00    Average packs/day: 1 pack/day for 20.0 years (20.0 ttl pk-yrs)    Types: Cigarettes    Quit date: 06/13/2023    Years since quitting: 1.3   Smokeless tobacco: Never   Tobacco comments:    Started smoking at [age] yrs old    Smoked 2 PPD at his heaviest.     Quit smoking on 06/13/2023  Substance and Sexual Activity   Alcohol use: No    Comment: SOBER SINCE 2010   Drug use: No   Sexual activity: Yes    Birth control/protection: None  Other Topics  Concern   Not on file  Social History Narrative   Not on file   Social Drivers of Health   Financial Resource Strain: Low Risk  (04/03/2024)   Received from [facility]   Overall Financial Resource Strain (CARDIA)    Difficulty of Paying Living Expenses: Not hard at all  Food Insecurity: No Food Insecurity (04/03/2024)   Received from [facility]   Hunger Vital Sign    Within the past 12 months, you worried that your food would run out before you got the money to  buy more.: Never true    Within the past 12 months, the food you bought just didn't last and you didn't have money to get more.: Never true  Transportation Needs: No Transportation Needs (04/03/2024)   Received from [facility]    In the past 12 months, has lack of transportation kept you from medical appointments or from getting medications?: No    Lack of Transportation (Non-Medical): No  Physical Activity: Not on file  Stress: Not on file  Social Connections: Not on file  Intimate Partner Violence: Not At Risk (06/14/2023)   Humiliation, Afraid, Rape, and Kick questionnaire    Fear of Current or Ex-Partner: No    Emotionally Abused: No    Physically Abused: No    Sexually Abused: No      Family History  Problem Relation Age of Onset   Breast cancer Mother    Heart disease Mother    Prostate cancer Father    Colon cancer Father    Diabetes Sister    Heart disease Sister    There were no vitals filed for this visit.  Wt Readings from Last 3 Encounters:  07/22/24 290 lb (131.5 kg)  05/29/24 292 lb 6 oz (132.6 kg)  03/30/24 (!) 310 lb 12.8 oz (141 kg)   Lab Results  Component Value Date   CREATININE 1.24 07/22/2024   CREATININE 1.46 (H) 05/29/2024   CREATININE 1.87 (H) 03/30/2024   PHYSICAL EXAM:  General: Well appearing.  Cor: No JVD. Regular rhythm, rate.  Lungs: clear Abdomen: soft, nontender, nondistended. Extremities:  no edema Neuro:. Affect pleasant   ECG: not done   ASSESSMENT & PLAN:  1: NICM with preserved ejection fraction- - suspect due to HTN/ OSA - NYHA class I - euvolemic  - weighing daily; reminded to call for an overnight weight gain of >2 pounds or a weekly weight gain of >5 pounds - weight down 18 pounds from last visit here 2 months ago. Reports eating healthier and being more active.  - Echo 02/27/20: EF of 45-50% along with mild MR and mildly elevated PA pressure.   - Echo 06/14/23: EF of 65-70% along with acute PE and right ventricular systolic function is severely reduced. The right ventricular size is severely enlarged.  - Echo 04/17/24: EF 55-60%, mild LVH, normal RV, trivial MR - not adding salt to his food and has been reading food labels. Walking 1 mile daily.  - saw cardiology Isabel) 09/23; cardiology referral to Dr. Bernita office was placed at last visit. He hasn't heard anything from them so their OV number was provided for him to call and schedule an appointment - continue farxiga  10mg  daily - continue entresto 97/103mg  BID - continue spironolactone  12.5mg  daily - continue torsemide  20mg  daily with additional 20mg  PM PRN. Has not had to take any PRN doses.  - BNP 06/13/23 was 801.7  2: HTN- - BP 108/82 - saw PCP (Hande) 06/25 - BMP 03/30/24 reviewed: sodium 135, potassium 5.3, creatinine 1.87 and GFR 44  - BMET today  3: Obesity- - has lost another 18 pounds since last visit by walking 1 mile daily and changing his diet. Has cut out all starchy foods - congratulated him on this and encouraged him to continue - A1c 02/24/24 was 6.7%  4: Obstructive sleep apnea- - last sleep study done 06/30/23 - saw pulmonology Mickael) 05/25 - pulm note says patient has not gotten bipap due to financial constraints and  is hesitant to use it due to mask discomfort  5: PE/ DVT- - diagnosed 08/24 - thrombectomy/thrombolysis done 06/14/23 - doppler on 06/14/23 positive for nonocclusive  thrombus within the left popliteal vein.  - continue apixaban  5mg  BID  6: Hyperlipidemia- - LDL 02/24/24 was 115 and rosuvastatin  was started - continue rosuvastatin  10mg  daily - lipid panel today   Return in 4 months, sooner if needed.   Ellouise Class, [place] 05/28/24

## 2024-10-01 ENCOUNTER — Telehealth: Payer: Self-pay | Admitting: Family

## 2024-10-01 ENCOUNTER — Encounter: Payer: Self-pay | Admitting: Family

## 2024-10-01 NOTE — Telephone Encounter (Signed)
 Patient did not show for his [facility] Failure Clinic appointment on 10/01/24

## 2024-11-20 ENCOUNTER — Encounter: Admitting: Family
